# Patient Record
Sex: Female | Born: 1970 | Race: White | Hispanic: No | Smoking: Never smoker
Health system: Southern US, Community
[De-identification: ages and names within clinical notes are randomized; demographics above are authoritative.]

## PROBLEM LIST (undated history)

## (undated) DIAGNOSIS — K589 Irritable bowel syndrome without diarrhea: Secondary | ICD-10-CM

## (undated) DIAGNOSIS — Z91018 Allergy to other foods: Secondary | ICD-10-CM

## (undated) DIAGNOSIS — I639 Cerebral infarction, unspecified: Secondary | ICD-10-CM

## (undated) DIAGNOSIS — G629 Polyneuropathy, unspecified: Secondary | ICD-10-CM

## (undated) DIAGNOSIS — K219 Gastro-esophageal reflux disease without esophagitis: Secondary | ICD-10-CM

## (undated) DIAGNOSIS — I10 Essential (primary) hypertension: Secondary | ICD-10-CM

## (undated) HISTORY — PX: ENDOMETRIAL ABLATION: SHX621

---

## 1998-01-03 ENCOUNTER — Other Ambulatory Visit: Admission: RE | Admit: 1998-01-03 | Discharge: 1998-01-03 | Payer: Self-pay | Admitting: Obstetrics and Gynecology

## 1999-02-12 ENCOUNTER — Other Ambulatory Visit: Admission: RE | Admit: 1999-02-12 | Discharge: 1999-02-12 | Payer: Self-pay | Admitting: Obstetrics and Gynecology

## 1999-03-11 ENCOUNTER — Ambulatory Visit (HOSPITAL_COMMUNITY): Admission: RE | Admit: 1999-03-11 | Discharge: 1999-03-11 | Payer: Self-pay | Admitting: Obstetrics and Gynecology

## 1999-03-11 ENCOUNTER — Encounter: Payer: Self-pay | Admitting: Obstetrics and Gynecology

## 2000-03-14 ENCOUNTER — Other Ambulatory Visit: Admission: RE | Admit: 2000-03-14 | Discharge: 2000-03-14 | Payer: Self-pay | Admitting: Obstetrics and Gynecology

## 2001-03-27 ENCOUNTER — Other Ambulatory Visit: Admission: RE | Admit: 2001-03-27 | Discharge: 2001-03-27 | Payer: Self-pay | Admitting: Obstetrics and Gynecology

## 2002-02-03 ENCOUNTER — Emergency Department (HOSPITAL_COMMUNITY): Admission: EM | Admit: 2002-02-03 | Discharge: 2002-02-03 | Payer: Self-pay | Admitting: *Deleted

## 2002-05-02 ENCOUNTER — Other Ambulatory Visit: Admission: RE | Admit: 2002-05-02 | Discharge: 2002-05-02 | Payer: Self-pay | Admitting: Obstetrics and Gynecology

## 2002-11-20 ENCOUNTER — Other Ambulatory Visit: Admission: RE | Admit: 2002-11-20 | Discharge: 2002-11-20 | Payer: Self-pay | Admitting: Obstetrics and Gynecology

## 2003-05-29 ENCOUNTER — Other Ambulatory Visit: Admission: RE | Admit: 2003-05-29 | Discharge: 2003-05-29 | Payer: Self-pay | Admitting: Obstetrics and Gynecology

## 2004-07-08 ENCOUNTER — Other Ambulatory Visit: Admission: RE | Admit: 2004-07-08 | Discharge: 2004-07-08 | Payer: Self-pay | Admitting: Obstetrics and Gynecology

## 2004-10-28 ENCOUNTER — Inpatient Hospital Stay (HOSPITAL_COMMUNITY): Admission: AD | Admit: 2004-10-28 | Discharge: 2004-10-28 | Payer: Self-pay | Admitting: Obstetrics and Gynecology

## 2005-01-20 ENCOUNTER — Encounter (INDEPENDENT_AMBULATORY_CARE_PROVIDER_SITE_OTHER): Payer: Self-pay | Admitting: *Deleted

## 2005-01-20 ENCOUNTER — Inpatient Hospital Stay (HOSPITAL_COMMUNITY): Admission: AD | Admit: 2005-01-20 | Discharge: 2005-01-22 | Payer: Self-pay | Admitting: Obstetrics and Gynecology

## 2005-01-23 ENCOUNTER — Encounter: Admission: RE | Admit: 2005-01-23 | Discharge: 2005-02-18 | Payer: Self-pay | Admitting: Obstetrics and Gynecology

## 2005-07-15 ENCOUNTER — Other Ambulatory Visit: Admission: RE | Admit: 2005-07-15 | Discharge: 2005-07-15 | Payer: Self-pay | Admitting: Obstetrics and Gynecology

## 2006-12-29 ENCOUNTER — Encounter: Admission: RE | Admit: 2006-12-29 | Discharge: 2006-12-29 | Payer: Self-pay | Admitting: Neurosurgery

## 2007-11-16 ENCOUNTER — Ambulatory Visit (HOSPITAL_COMMUNITY): Admission: RE | Admit: 2007-11-16 | Discharge: 2007-11-16 | Payer: Self-pay | Admitting: Obstetrics and Gynecology

## 2007-11-16 ENCOUNTER — Encounter (INDEPENDENT_AMBULATORY_CARE_PROVIDER_SITE_OTHER): Payer: Self-pay | Admitting: Obstetrics and Gynecology

## 2010-10-18 ENCOUNTER — Encounter: Payer: Self-pay | Admitting: Obstetrics and Gynecology

## 2011-02-09 NOTE — Op Note (Signed)
NAMEVESNA, Annette Lucas               ACCOUNT NO.:  [id number]   MEDICAL RECORD NO.:  [id number]          PATIENT TYPE:  AMB   LOCATION:  SDC                           FACILITY:  [facility]   PHYSICIAN:  Zenaida Niece, M.D.DATE OF BIRTH:  November 09, 1970   DATE OF PROCEDURE:  11/16/2007  DATE OF DISCHARGE:                               OPERATIVE REPORT   PREOPERATIVE DIAGNOSES:  1. Menorrhagia.  2. Dysmenorrhea.  3. Pelvic pain.   POSTOPERATIVE DIAGNOSES:  1. Menorrhagia.  2. Dysmenorrhea.  3. Pelvic pain.  4. Endometriosis.   PROCEDURE:  Laparoscopy with fulguration of endometriosis, hysteroscopy  with dilation and curettage, and NovaSure endometrial ablation.   SURGEON:  Zenaida Niece, M.D.   ANESTHESIA:  General endotracheal tube and paracervical block.   FINDINGS:  She had a normal pelvis except for implants of endometriosis  in both ovarian fossa, normal uterine cavity.  The NovaSure device used  a depth of 6 cm, width of 3.8 cm, and used 125 watts for 65 seconds.   SPECIMENS:  Endometrial curettings sent to pathology.   ESTIMATED BLOOD LOSS:  Minimal.   COMPLICATIONS:  None.   PROCEDURE IN DETAIL:  The patient was taken to the operating room and  placed in the dorsosupine position.  General anesthesia was induced, and  she was placed in mobile stirrups.  The abdomen, perineum, and vagina  were then prepped and draped in the usual sterile fashion, bladder  drained with a latex-free catheter, Hulka tenaculum applied to the  cervix for uterine manipulation.   Infraumbilical skin was then infiltrated with 0.25% Marcaine and 0.75-cm  vertical incision was made.  The Veress needle was inserted into the  peritoneal cavity and placement confirmed by the water drop test and an  opening pressure of 2 mmHg.  CO2 gas was insufflated to a pressure of 14  mmHg, and the Veress needle was removed.  A 5-mm Optiview trocar was  then introduced with direct visualization with the  laparoscope.  A 5-mm  port was then also placed on the left side also under direct  visualization.  Inspection revealed the above-mentioned findings with a  normal appendix and upper abdomen.  The uterus, tubes, and ovaries  appeared normal.  She had implants consistent with endometriosis in both  ovarian fossa.  Implants on the right side were fulgurated with bipolar  cautery, as they were all superior to the ureter.  Some of the implants  on the left side were fulgurated with bipolar cautery, but a significant  amount were right over the ureter and were left intact.  No further  source for her pain was noted.  The 5-mm port on the left side was  removed under direct visualization.  All gas was allowed to deflate from  the abdomen, and the umbilical trocar was removed.  The skin incisions  were closed with interrupted subcuticular sutures of 4-0 Vicryl followed  by Dermabond.   Attention was turned vaginally.  The legs were elevated in stirrups.  The Hulka tenaculum was removed from the cervix.  A Graves speculum was  inserted  into the vagina and the anterior lip of the cervix grasped with  a single-tooth tenaculum.  The patient was given IV Toradol.  A deep  paracervical block was performed with a total of 16 mL of 2% plain  lidocaine.  The uterus sounded to approximately 9 cm.  The cervix  gradually easily dilated to a size 21 dilator and the observer  hysteroscope was easily inserted.  Good visualization was achieved and  revealed some polypoid endometrial tissue, probably more just normal  endometrial tissue instead of a polyp.  The hysteroscope was removed,  and endometrial curettage was performed with return of a small amount of  tissue.  Hysteroscopy was then repeated, and minimal tissue was left in  the uterus from which again had a normal cavity.  The hysteroscope was  removed after all fluid was removed.  Using the sure sound, the uterine  depth was 6 cm.  The cervix was  gradually dilated to a size 7 and then a  size 8 Hegar dilator.  The NovaSure device was easily then inserted and  deployed.  CO2 test was passed.  Endometrial ablation was then performed  with the above-mentioned settings without complications.  The device was  then removed intact.  The single-tooth tenaculum was removed, and  bleeding was controlled with pressure.  All instruments were then  removed from the vagina.   The patient tolerated the procedure well and was taken to the recovery  room in stable condition.  Counts were correct.  She had PAS hose on  throughout the procedure.      Zenaida Niece, M.D.  Electronically Signed     TDM/MEDQ  D:  11/16/2007  T:  11/16/2007  Job:  [id number]

## 2011-02-12 NOTE — Discharge Summary (Signed)
NAMEJOHNATHAN, TORTORELLI               ACCOUNT NO.:  [id number]   MEDICAL RECORD NO.:  [id number]          PATIENT TYPE:  INP   LOCATION:  9123                          FACILITY:  [facility]   PHYSICIAN:  Malachi Pro. Ambrose Mantle, M.D. DATE OF BIRTH:  October 10, 1970   DATE OF ADMISSION:  01/19/2005  DATE OF DISCHARGE:                                 DISCHARGE SUMMARY   HOSPITAL COURSE:  This is a 40 year old white female para 0-0-1-0 gravida 2;  estimated gestational age [redacted] weeks by 9-week ultrasound with [facility] January 20, 2005; presented with regular contractions. She was evaluated in maternity  admission with a cervix 2+ cm and regular contractions. Her blood group and  type was O negative with a negative antibody, RPR nonreactive, rubella  equivocal, hepatitis B surface antigen negative, HIV declined, GC and  chlamydia negative, triple screen normal, 1-hour Glucola 80, group B strep  negative. Prenatal care:  The patient had a placenta previa on ultrasound  that resolved. Obstetric history:  She had had a spontaneous abortion. Past  medical history:  Irritable bowel syndrome, urinary tract infection, and  fracture of arm and clavicle. Surgical history:  Cone biopsy. Allergies:  PENICILLIN and LEVAQUIN. Medications:  Zantac. Social history:  Married,  without tobacco. Family history negative. On admission, the patient was  afebrile with normal vital signs, heart was normal, lungs clear. Abdomen was  soft, estimated fetal weight 7-and-a-half pounds. Cervix was 4 cm, 90%. Dr.  Jackelyn Knife ruptured the patient's membranes, fluid was clear. By 9 a.m. the  contractions were every 2 to 3-and-a-half minutes, cervix 3-4 cm, 90%,  vertex at a -2, and her temperature was 100.0 degrees. By 12:55 p.m. the  contractions were every 2-3 minutes. Cervix had progressed to an anterior  lip. Vertex was at a 0 to +1 station. By 4:40 p.m. the patient had become  fully dilated at 3:15 p.m. and her temperature was 101+. The vertex  was ROP.  I chose not to use antibiotics since delivery was near. The patient's vertex  remained ROP until it rotated shortly before delivery. She then delivered  spontaneously ROA over a first degree vaginal and bilateral labial  lacerations, a living female infant 7 pounds 9 ounces, with Apgars of 7 at  one and 9 at five minutes. Delivery occurred at 5:42 p.m. Placenta was  intact with a succenturiate lobe. The uterus was normal, rectal was  negative. Lacerations were repaired with 3-0 Vicryl, blood loss about 500  mL.  Postpartum, the patient's temperature did not rise any. She remained  afebrile. She felt good except for her left arm continued to cause  significant problems. She had been evaluated by this by a Dr. Prince Rome in her  orthopedic office prior to delivery and now she will seek consultation with  him again within the next few days.   LABORATORY DATA:  The patient needs RhoGAM. Her hemoglobin was 12.2;  hematocrit 35.2; white count 19,000; platelet count 302,000. Follow-up  hemoglobin 9.6. RPR nonreactive. The patient is equivocal on rubella and she  should receive the rubella vaccine now or in  the future.   FINAL DIAGNOSES:  1.  Intrauterine pregnancy at 40 weeks delivered right occiput anterior.  2.  Prolonged second stage of labor.  3.  Possible chorioamnionitis.   OPERATION:  Spontaneous delivery ROA, repair of vaginal and labial  lacerations.   FINAL CONDITION:  Improved.   INSTRUCTIONS:  Include our regular discharge instruction booklet. The  patient is to receive RhoGAM, and if RhoGAM and rubella should not be given  at the same time then she should receive the rubella vaccine at a later  date. The patient is to return to the office in 6 weeks. Percocet 5/325 #24  tablets one q.4-6h. as needed for pain is given at discharge.      TFH/MEDQ  D:  01/22/2005  T:  01/22/2005  Job:  [id number]

## 2011-06-18 LAB — CBC
HCT: 40.6
Hemoglobin: 14.3
MCHC: 35.2
MCV: 87.7
Platelets: 317
RBC: 4.63
RDW: 12.8
WBC: 8.7

## 2011-06-18 LAB — PREGNANCY, URINE: Preg Test, Ur: NEGATIVE

## 2011-06-29 ENCOUNTER — Inpatient Hospital Stay (HOSPITAL_COMMUNITY): Admission: RE | Admit: 2011-06-29 | Payer: No Typology Code available for payment source | Source: Ambulatory Visit

## 2011-06-30 ENCOUNTER — Encounter (HOSPITAL_COMMUNITY): Payer: Self-pay

## 2011-06-30 ENCOUNTER — Encounter (HOSPITAL_COMMUNITY)
Admission: RE | Admit: 2011-06-30 | Discharge: 2011-06-30 | Disposition: A | Discharge status: Home / Self Care | Payer: PRIVATE HEALTH INSURANCE | Source: Ambulatory Visit | Attending: Obstetrics and Gynecology | Admitting: Obstetrics and Gynecology

## 2011-06-30 HISTORY — DX: Irritable bowel syndrome, unspecified: K58.9

## 2011-06-30 LAB — SURGICAL PCR SCREEN
MRSA, PCR: NEGATIVE
Staphylococcus aureus: NEGATIVE

## 2011-06-30 LAB — CBC
HCT: 41.5 % (ref 36.0–46.0)
Hemoglobin: 14.3 g/dL (ref 12.0–15.0)
MCH: 31.2 pg (ref 26.0–34.0)
MCHC: 34.5 g/dL (ref 30.0–36.0)
MCV: 90.6 fL (ref 78.0–100.0)
Platelets: 315 10*3/uL (ref 150–400)
RBC: 4.58 MIL/uL (ref 3.87–5.11)
RDW: 12.8 % (ref 11.5–15.5)
WBC: 10.3 10*3/uL (ref 4.0–10.5)

## 2011-06-30 NOTE — Patient Instructions (Signed)
   Your procedure is scheduled on:10/9 Enter through the Main Entrance of [facility] at:0600 Pick up the phone at the desk and dial [phone] and inform us of your arrival  Please call this number if you have any problems the morning of surgery: [phone]  Remember: Do not eat food after midnight  Do not drink clear liquids after:midnight Take these medicines the morning of surgery with a SIP OF WATER:  Do not wear jewelry, make-up, or FINGER nail polish Do not wear lotions, powders, or perfumes.  You may wear deodorant. Do not shave 48 hours prior to surgery. Do not bring valuables to the hospital. Leave suitcase in the car. After Surgery it may be brought to your room. For patients being admitted to the hospital, checkout time is 11:00am the day of discharge.  Patients discharged on the day of surgery will not be allowed to drive home.   Name and phone number of your driver:Chris- [phone]   Remember to use your hibiclens as instructed.Please shower with 1/2 bottle the evening before your surgery and the other 1/2 bottle the morning of surgery.

## 2011-07-05 MED ORDER — GENTAMICIN SULFATE 40 MG/ML IJ SOLN
INTRAVENOUS | Status: AC
  Administered 2011-07-06: 100 mL via INTRAVENOUS
  Filled 2011-07-05: qty 2.5

## 2011-07-06 ENCOUNTER — Inpatient Hospital Stay (HOSPITAL_COMMUNITY): Payer: PRIVATE HEALTH INSURANCE | Admitting: Anesthesiology

## 2011-07-06 ENCOUNTER — Encounter (HOSPITAL_COMMUNITY): Payer: Self-pay | Admitting: *Deleted

## 2011-07-06 ENCOUNTER — Other Ambulatory Visit: Payer: Self-pay | Admitting: Obstetrics and Gynecology

## 2011-07-06 ENCOUNTER — Inpatient Hospital Stay (HOSPITAL_COMMUNITY)
Admission: RE | Admit: 2011-07-06 | Discharge: 2011-07-07 | DRG: 743 | Disposition: A | Discharge status: Home / Self Care | Payer: PRIVATE HEALTH INSURANCE | Source: Ambulatory Visit | Attending: Obstetrics and Gynecology | Admitting: Obstetrics and Gynecology

## 2011-07-06 ENCOUNTER — Encounter (HOSPITAL_COMMUNITY): Payer: Self-pay | Admitting: Anesthesiology

## 2011-07-06 ENCOUNTER — Encounter (HOSPITAL_COMMUNITY)
Admission: RE | Disposition: A | Discharge status: Home / Self Care | Payer: Self-pay | Source: Ambulatory Visit | Attending: Obstetrics and Gynecology

## 2011-07-06 DIAGNOSIS — N925 Other specified irregular menstruation: Principal | ICD-10-CM | POA: Diagnosis present

## 2011-07-06 DIAGNOSIS — N938 Other specified abnormal uterine and vaginal bleeding: Principal | ICD-10-CM | POA: Diagnosis present

## 2011-07-06 DIAGNOSIS — N920 Excessive and frequent menstruation with regular cycle: Secondary | ICD-10-CM

## 2011-07-06 DIAGNOSIS — N949 Unspecified condition associated with female genital organs and menstrual cycle: Principal | ICD-10-CM | POA: Diagnosis present

## 2011-07-06 DIAGNOSIS — Z01818 Encounter for other preprocedural examination: Secondary | ICD-10-CM

## 2011-07-06 DIAGNOSIS — Z01812 Encounter for preprocedural laboratory examination: Secondary | ICD-10-CM

## 2011-07-06 DIAGNOSIS — K589 Irritable bowel syndrome without diarrhea: Secondary | ICD-10-CM | POA: Diagnosis present

## 2011-07-06 HISTORY — PX: VAGINAL HYSTERECTOMY: SHX2639

## 2011-07-06 HISTORY — PX: CYSTOSCOPY: SHX5120

## 2011-07-06 LAB — PREGNANCY, URINE: Preg Test, Ur: NEGATIVE

## 2011-07-06 SURGERY — HYSTERECTOMY, VAGINAL
Anesthesia: General | Site: Uterus | Wound class: Clean Contaminated

## 2011-07-06 MED ORDER — LIDOCAINE HCL (CARDIAC) 20 MG/ML IV SOLN
INTRAVENOUS | Status: DC | PRN
  Administered 2011-07-06: 50 mg via INTRAVENOUS

## 2011-07-06 MED ORDER — KETOROLAC TROMETHAMINE 30 MG/ML IJ SOLN
INTRAMUSCULAR | Status: AC
  Filled 2011-07-06: qty 1

## 2011-07-06 MED ORDER — ONDANSETRON HCL 4 MG/2ML IJ SOLN
INTRAMUSCULAR | Status: DC | PRN
  Administered 2011-07-06: 4 mg via INTRAVENOUS

## 2011-07-06 MED ORDER — HYOSCYAMINE SULFATE 0.125 MG SL SUBL
0.1250 mg | SUBLINGUAL_TABLET | SUBLINGUAL | Status: DC | PRN
  Filled 2011-07-06: qty 1

## 2011-07-06 MED ORDER — LACTATED RINGERS IV SOLN
INTRAVENOUS | Status: DC
  Administered 2011-07-06: 1000 mL via INTRAVENOUS

## 2011-07-06 MED ORDER — ONDANSETRON HCL 4 MG/2ML IJ SOLN
INTRAMUSCULAR | Status: AC
  Filled 2011-07-06: qty 2

## 2011-07-06 MED ORDER — MIDAZOLAM HCL 2 MG/2ML IJ SOLN
0.5000 mg | INTRAMUSCULAR | Status: DC | PRN
  Administered 2011-07-06: 2 mg via INTRAVENOUS

## 2011-07-06 MED ORDER — PROPOFOL 10 MG/ML IV EMUL
INTRAVENOUS | Status: DC | PRN
  Administered 2011-07-06: 150 mg via INTRAVENOUS

## 2011-07-06 MED ORDER — DIPHENHYDRAMINE HCL 12.5 MG/5ML PO ELIX
12.5000 mg | ORAL_SOLUTION | Freq: Four times a day (QID) | ORAL | Status: DC | PRN
  Administered 2011-07-06 (×2): 12.5 mg via ORAL
  Filled 2011-07-06 (×2): qty 5

## 2011-07-06 MED ORDER — FENTANYL CITRATE 0.05 MG/ML IJ SOLN
INTRAMUSCULAR | Status: DC | PRN
  Administered 2011-07-06: 100 ug via INTRAVENOUS
  Administered 2011-07-06: 150 ug via INTRAVENOUS
  Administered 2011-07-06: 100 ug via INTRAVENOUS

## 2011-07-06 MED ORDER — STERILE WATER FOR IRRIGATION IR SOLN
Status: DC | PRN
  Administered 2011-07-06: 1000 mL

## 2011-07-06 MED ORDER — INDIGOTINDISULFONATE SODIUM 8 MG/ML IJ SOLN
INTRAMUSCULAR | Status: DC | PRN
  Administered 2011-07-06: 5 mL via INTRAVENOUS

## 2011-07-06 MED ORDER — PROPOFOL 10 MG/ML IV EMUL
INTRAVENOUS | Status: AC
  Filled 2011-07-06: qty 20

## 2011-07-06 MED ORDER — LACTATED RINGERS IV SOLN
INTRAVENOUS | Status: DC
  Administered 2011-07-06 (×4): via INTRAVENOUS

## 2011-07-06 MED ORDER — KETOROLAC TROMETHAMINE 30 MG/ML IJ SOLN
30.0000 mg | Freq: Four times a day (QID) | INTRAMUSCULAR | Status: DC
  Administered 2011-07-06 – 2011-07-07 (×3): 30 mg via INTRAVENOUS
  Filled 2011-07-06 (×4): qty 1

## 2011-07-06 MED ORDER — IBUPROFEN 600 MG PO TABS: 600.0000 mg | ORAL_TABLET | Freq: Four times a day (QID) | ORAL | Status: DC | PRN

## 2011-07-06 MED ORDER — DIPHENHYDRAMINE HCL 50 MG/ML IJ SOLN: 12.5000 mg | Freq: Four times a day (QID) | INTRAMUSCULAR | Status: DC | PRN

## 2011-07-06 MED ORDER — LORATADINE 10 MG PO TABS
10.0000 mg | ORAL_TABLET | Freq: Every day | ORAL | Status: DC
  Administered 2011-07-07: 10 mg via ORAL
  Filled 2011-07-06 (×2): qty 1

## 2011-07-06 MED ORDER — HYDROMORPHONE 0.3 MG/ML IV SOLN
INTRAVENOUS | Status: DC
  Administered 2011-07-06: 0.7 mg via INTRAVENOUS
  Administered 2011-07-06: 0.799 mg via INTRAVENOUS
  Administered 2011-07-06: 10:00:00 via INTRAVENOUS
  Administered 2011-07-06: 1.79 mg via INTRAVENOUS
  Administered 2011-07-07: 0.399 mg via INTRAVENOUS
  Administered 2011-07-07: 0.4 mg via INTRAVENOUS

## 2011-07-06 MED ORDER — MIDAZOLAM HCL 2 MG/2ML IJ SOLN
INTRAMUSCULAR | Status: AC
  Administered 2011-07-06: 2 mg via INTRAVENOUS
  Filled 2011-07-06: qty 2

## 2011-07-06 MED ORDER — HYDROMORPHONE 0.3 MG/ML IV SOLN
INTRAVENOUS | Status: AC
  Filled 2011-07-06: qty 25

## 2011-07-06 MED ORDER — ONDANSETRON HCL 4 MG PO TABS: 4.0000 mg | ORAL_TABLET | Freq: Four times a day (QID) | ORAL | Status: DC | PRN

## 2011-07-06 MED ORDER — KETOROLAC TROMETHAMINE 30 MG/ML IJ SOLN: 30.0000 mg | Freq: Four times a day (QID) | INTRAMUSCULAR | Status: DC

## 2011-07-06 MED ORDER — FAMOTIDINE 20 MG PO TABS
ORAL_TABLET | ORAL | Status: AC
  Administered 2011-07-06: 20 mg via ORAL
  Filled 2011-07-06: qty 1

## 2011-07-06 MED ORDER — VASOPRESSIN 20 UNIT/ML IJ SOLN
INTRAVENOUS | Status: DC | PRN
  Administered 2011-07-06: 08:00:00 via INTRAMUSCULAR

## 2011-07-06 MED ORDER — ZOLPIDEM TARTRATE 5 MG PO TABS: 5.0000 mg | ORAL_TABLET | Freq: Every evening | ORAL | Status: DC | PRN

## 2011-07-06 MED ORDER — HYDROCODONE-ACETAMINOPHEN 5-325 MG PO TABS: 1.0000 | ORAL_TABLET | ORAL | Status: DC | PRN

## 2011-07-06 MED ORDER — LIDOCAINE HCL (CARDIAC) 20 MG/ML IV SOLN
INTRAVENOUS | Status: AC
  Filled 2011-07-06: qty 5

## 2011-07-06 MED ORDER — ONDANSETRON HCL 4 MG/2ML IJ SOLN: 4.0000 mg | Freq: Four times a day (QID) | INTRAMUSCULAR | Status: DC | PRN

## 2011-07-06 MED ORDER — FAMOTIDINE 20 MG PO TABS
20.0000 mg | ORAL_TABLET | Freq: Once | ORAL | Status: AC
  Administered 2011-07-06 (×2): 20 mg via ORAL

## 2011-07-06 MED ORDER — FENTANYL CITRATE 0.05 MG/ML IJ SOLN
INTRAMUSCULAR | Status: AC
  Filled 2011-07-06: qty 2

## 2011-07-06 MED ORDER — SODIUM CHLORIDE 0.9 % IJ SOLN: 9.0000 mL | INTRAMUSCULAR | Status: DC | PRN

## 2011-07-06 MED ORDER — MIDAZOLAM HCL 2 MG/2ML IJ SOLN
INTRAMUSCULAR | Status: AC
  Filled 2011-07-06: qty 2

## 2011-07-06 MED ORDER — HYOSCYAMINE SULFATE 0.125 MG PO TABS
0.1250 mg | ORAL_TABLET | ORAL | Status: DC | PRN
  Filled 2011-07-06: qty 1

## 2011-07-06 MED ORDER — NALOXONE HCL 0.4 MG/ML IJ SOLN: 0.4000 mg | INTRAMUSCULAR | Status: DC | PRN

## 2011-07-06 MED ORDER — DEXAMETHASONE SODIUM PHOSPHATE 4 MG/ML IJ SOLN
INTRAMUSCULAR | Status: DC | PRN
  Administered 2011-07-06: 5 mg via INTRAVENOUS

## 2011-07-06 MED ORDER — LACTATED RINGERS IV SOLN
INTRAVENOUS | Status: DC
  Administered 2011-07-06 – 2011-07-07 (×3): via INTRAVENOUS

## 2011-07-06 MED ORDER — KETOROLAC TROMETHAMINE 30 MG/ML IJ SOLN
INTRAMUSCULAR | Status: DC | PRN
  Administered 2011-07-06: 30 mg via INTRAVENOUS

## 2011-07-06 MED ORDER — MIDAZOLAM HCL 5 MG/5ML IJ SOLN
INTRAMUSCULAR | Status: DC | PRN
  Administered 2011-07-06: 2 mg via INTRAVENOUS

## 2011-07-06 MED ORDER — FENTANYL CITRATE 0.05 MG/ML IJ SOLN
INTRAMUSCULAR | Status: AC
  Filled 2011-07-06: qty 5

## 2011-07-06 SURGICAL SUPPLY — 26 items
CANISTER SUCTION 2500CC (MISCELLANEOUS) ×3 IMPLANT
CLOTH BEACON ORANGE TIMEOUT ST (SAFETY) ×3 IMPLANT
CONT PATH 16OZ SNAP LID 3702 (MISCELLANEOUS) ×3 IMPLANT
DECANTER SPIKE VIAL GLASS SM (MISCELLANEOUS) ×3 IMPLANT
DRAPE HYSTEROSCOPY (DRAPE) ×3 IMPLANT
DRAPE UTILITY XL STRL (DRAPES) ×3 IMPLANT
GLOVE BIO SURGEON STRL SZ8 (GLOVE) ×3 IMPLANT
GLOVE BIOGEL PI IND STRL 6.5 (GLOVE) ×2 IMPLANT
GLOVE BIOGEL PI INDICATOR 6.5 (GLOVE) ×1
GLOVE ORTHO TXT STRL SZ7.5 (GLOVE) ×3 IMPLANT
GOWN BRE IMP SLV AUR LG STRL (GOWN DISPOSABLE) ×6 IMPLANT
GOWN BRE IMP SLV AUR XL STRL (GOWN DISPOSABLE) ×3 IMPLANT
GOWN STRL REIN XL XLG (GOWN DISPOSABLE) ×3 IMPLANT
NS IRRIG 1000ML POUR BTL (IV SOLUTION) ×3 IMPLANT
PACK VAGINAL WOMENS (CUSTOM PROCEDURE TRAY) ×3 IMPLANT
SET CYSTO W/LG BORE CLAMP LF (SET/KITS/TRAYS/PACK) ×3 IMPLANT
SUT CHROMIC 1 TIES 18 (SUTURE) ×3 IMPLANT
SUT CHROMIC 1MO 4 18 CR8 (SUTURE) ×6 IMPLANT
SUT SILK 2 0 SH (SUTURE) ×3 IMPLANT
SUT VIC AB 2-0 CT1 27 (SUTURE) ×1
SUT VIC AB 2-0 CT1 TAPERPNT 27 (SUTURE) ×2 IMPLANT
SUT VIC AB 3-0 SH 27 (SUTURE)
SUT VIC AB 3-0 SH 27X BRD (SUTURE) IMPLANT
TOWEL OR 17X24 6PK STRL BLUE (TOWEL DISPOSABLE) ×6 IMPLANT
TRAY FOLEY CATH 14FR (SET/KITS/TRAYS/PACK) ×3 IMPLANT
WATER STERILE IRR 1000ML POUR (IV SOLUTION) ×3 IMPLANT

## 2011-07-06 NOTE — Anesthesia Preprocedure Evaluation (Addendum)
Anesthesia Evaluation  Name, MR# and DOB Patient awake  General Assessment Comment  Reviewed: Allergy & Precautions, H&P , NPO status , Patient's Chart, lab work & pertinent test results  Airway Mallampati: I TM Distance: >3 FB Neck ROM: full    Dental No notable dental hx. (+) Teeth Intact   Pulmonary    Pulmonary exam normal       Cardiovascular     Neuro/Psych Negative Neurological ROS  Negative Psych ROS   GI/Hepatic negative GI ROS Neg liver ROS    Endo/Other  Negative Endocrine ROS  Renal/GU negative Renal ROS     Musculoskeletal negative musculoskeletal ROS (+)   Abdominal Normal abdominal exam  (+)   Peds  Hematology negative hematology ROS (+)   Anesthesia Other Findings   Reproductive/Obstetrics negative OB ROS                           Anesthesia Physical Anesthesia Plan  ASA: II  Anesthesia Plan: General   Post-op Pain Management:    Induction: Intravenous  Airway Management Planned: LMA  Additional Equipment:   Intra-op Plan:   Post-operative Plan:   Informed Consent: I have reviewed the patients History and Physical, chart, labs and discussed the procedure including the risks, benefits and alternatives for the proposed anesthesia with the patient or authorized representative who has indicated his/her understanding and acceptance.     Plan Discussed with: CRNA  Anesthesia Plan Comments:        Anesthesia Quick Evaluation

## 2011-07-06 NOTE — H&P (Signed)
Annette Lucas is an 40 y.o. female. P1011, here for definitive treatment of abnormal bleeding.  She had Novasure in 10-2007 and initially did well.  However, she has recently had frequent irregular bleeding episodes with normal exam.  All options have been discussed and she wants definitive therapy.  Pertinent Gynecological History: Menses: irregular Previous GYN Procedures: cone biopsy, D&C  Last pap: normal  OB History: G2, P1011   Menstrual History: No LMP recorded.    Past Medical History  Diagnosis Date  . IBS (irritable bowel syndrome)   Cervical Dysplasia  Past Surgical History  Procedure Date  . Endometrial ablation   Cone biopsy D&C Laparoscopy  History reviewed. No pertinent family history.  Social History:  reports that she has never smoked. She does not have any smokeless tobacco history on file. She reports that she does not drink alcohol or use illicit drugs.  Allergies:  Allergies  Allergen Reactions  . Levaquin Anaphylaxis  . Penicillins Anaphylaxis    Prescriptions prior to admission  Medication Sig Dispense Refill  . Cetirizine HCl (ZYRTEC PO) Take 1 tablet by mouth daily as needed. For allergy.       . hyoscyamine (LEVSIN) 0.125 MG/5ML ELIX Take 0.125 mg by mouth every 4 (four) hours as needed. For IBS.       Marland Kitchen ibuprofen (ADVIL,MOTRIN) 200 MG tablet Take 200 mg by mouth every 6 (six) hours as needed. For pain.         Review of Systems  Respiratory: Negative.   Cardiovascular: Negative.   Gastrointestinal: Negative.   Genitourinary: Negative.     Blood pressure 143/81, pulse 78, temperature 98.1 F (36.7 C), temperature source Oral, resp. rate 18, SpO2 100.00%. Physical Exam  Constitutional: She appears well-developed and well-nourished.  Neck: Neck supple. No thyromegaly present.  Cardiovascular: Normal rate, regular rhythm and normal heart sounds.   No murmur heard. Respiratory: Breath sounds normal. No respiratory distress. She has no  wheezes.  GI: Soft. She exhibits no distension and no mass. There is no tenderness.  Genitourinary: Vagina normal and uterus normal.    Results for orders placed during the hospital encounter of 07/06/11 (from the past 24 hour(s))  PREGNANCY, URINE     Status: Normal   Collection Time   07/06/11  6:20 AM      Component Value Range   Preg Test, Ur NEGATIVE      No results found.  Assessment/Plan: Recurrent abnormal bleeding, prior Novasure.  I have discussed all medical and surgical options.  She wants definitive therapy with hysterectomy.  Discussed TVH procedure, risks, and that it is almost certain this will fix her bleeding.  Will proceed with TVH.  Jerid Catherman D 07/06/2011, 7:03 AM

## 2011-07-06 NOTE — Progress Notes (Signed)
UR Chart review completed.  

## 2011-07-06 NOTE — Op Note (Signed)
Preoperative diagnosis: Abnormal uterine bleeding Postop diagnosis: Abnormal uterine bleeding Procedure: Total vaginal hysterectomy and cystoscopy Surgeon: Lavina Hamman M.D. Assistant: Huel Cote M.D. Anesthesia: Gen. With an LMA Findings: Patient had a small uterus with normal tubes and ovaries. Via cystoscopy bladder was normal and both ureters were patent Estimated blood loss: 100 cc Specimens: Uterus into routine pathology Complications: None  Procedure in detail: The patient was taken to the operating room and placed in the dorsosupine position. General anesthesia was induced and she was placed in mobile stirrups. Legs were elevated in the stirrups. Perineum and vagina were then prepped and draped in the usual sterile fashion, bladder drained with red Robinson catheter. A Graves speculum was inserted in the vagina and the cervix was grasped with Christella Hartigan tenaculums. Dilute Pitressin was then instilled at the cervicovaginal junction which was then incised circumferentially with electrocautery. Sharp dissection was then used to further free the vagina from the cervix. Anterior peritoneum was identified and entered sharply. A Deaver retractor was used to retract the bladder anteriorly. Posterior cul-de-sac was identified and entered sharply. A Bonnano speculum was placed into the posterior cul-de-sac. Uterosacral ligaments were clamped transected and ligated with #1 chromic and tagged for later use. Uterine arteries cardinal ligaments and broad ligaments were also clamped transected and ligated with #1 chromic. Uterine pedicles were then clamped transected and doubly ligated with #1 chromic and tagged for inspection. Bleeding from each side was controlled with a figure-of-eight suture of #1 chromic. Tubes and ovaries were inspected and found to be normal. The uterosacral ligaments were then plicated in the midline with 2-0 silk. The Bonnano speculum was removed and a shorter vaginal speculum was  placed. The previously tagged uterosacral pedicles were also tied in the midline. No significant bleeding was identified. The vagina was then closed in a vertical fashion with running locking 2-0 Vicryl with adequate closure and adequate hemostasis.  Attention was turned to cystoscopy. The patient had been given indigo carmine IV. A 70 cystoscope was inserted and 100 cc of fluid was instilled. The bladder appeared normal. Both ureteral orifices were easily identified and blue tinged urine was seen to flow freely from each orifice. The cystoscope was removed and a Foley catheter was placed. The patient was taken down from stirrups. She was awakened in the operating room and taken to the recovery room in stable condition after tolerating the procedure well. Counts were correct, she had PAS hose on throughout the procedure, she received gentamicin and clindamycin preop.

## 2011-07-06 NOTE — Anesthesia Postprocedure Evaluation (Signed)
Anesthesia Post Note  Patient: Annette Lucas  Procedure(s) Performed:  HYSTERECTOMY VAGINAL; CYSTOSCOPY  Anesthesia type: General  Patient location: PACU  Post pain: Pain level controlled  Post assessment: Post-op Vital signs reviewed  Last Vitals:  Filed Vitals:   07/06/11 0900  BP: 123/76  Pulse: 68  Temp:   Resp: 12    Post vital signs: Reviewed  Level of consciousness: sedated  Complications: No apparent anesthesia complications

## 2011-07-06 NOTE — Transfer of Care (Signed)
Immediate Anesthesia Transfer of Care Note  Patient: Annette Lucas  Procedure(s) Performed:  HYSTERECTOMY VAGINAL; CYSTOSCOPY  Patient Location: PACU  Anesthesia Type: General  Level of Consciousness: awake, alert  and oriented  Airway & Oxygen Therapy: Patient Spontanous Breathing and Patient connected to nasal cannula oxygen  Post-op Assessment: Report given to PACU RN and Post -op Vital signs reviewed and stable  Post vital signs: Reviewed and stable  Complications: No apparent anesthesia complications

## 2011-07-06 NOTE — Progress Notes (Signed)
History reviewed, pt examined, no change in status.  Will proceed with TVH and cystoscopy.

## 2011-07-06 NOTE — Progress Notes (Signed)
Iv started by Janene Harvey, rn

## 2011-07-06 NOTE — Anesthesia Postprocedure Evaluation (Signed)
  Anesthesia Post-op Note  Patient: Annette Lucas  Procedure(s) Performed:  HYSTERECTOMY VAGINAL; CYSTOSCOPY  Patient Location: 311  Anesthesia Type: General  Level of Consciousness: awake, alert  and oriented  Airway and Oxygen Therapy: Patient Spontanous Breathing  Post-op Pain: mild  Post-op Assessment: Post-op Vital signs reviewed and Patient's Cardiovascular Status Stable  Post-op Vital Signs: Reviewed and stable  Complications: No apparent anesthesia complications

## 2011-07-06 NOTE — Progress Notes (Signed)
Encounter addended by: Karleen Dolphin on: 07/06/2011  5:40 PM<BR>     Documentation filed: Notes Section

## 2011-07-07 ENCOUNTER — Encounter (HOSPITAL_COMMUNITY): Payer: Self-pay | Admitting: Obstetrics and Gynecology

## 2011-07-07 LAB — CBC
HCT: 34.7 % — ABNORMAL LOW (ref 36.0–46.0)
Hemoglobin: 11.9 g/dL — ABNORMAL LOW (ref 12.0–15.0)
MCH: 31.1 pg (ref 26.0–34.0)
MCHC: 34.3 g/dL (ref 30.0–36.0)
MCV: 90.6 fL (ref 78.0–100.0)
Platelets: 234 10*3/uL (ref 150–400)
RBC: 3.83 MIL/uL — ABNORMAL LOW (ref 3.87–5.11)
RDW: 12.9 % (ref 11.5–15.5)
WBC: 11.1 10*3/uL — ABNORMAL HIGH (ref 4.0–10.5)

## 2011-07-07 MED ORDER — HYDROCODONE-ACETAMINOPHEN 5-325 MG PO TABS: 1.0000 | ORAL_TABLET | ORAL | Status: AC | PRN

## 2011-07-07 NOTE — Progress Notes (Signed)
POD#1 TVH Doing ok, no n/v Afeb, VSS Abd- soft Hgb 11.9 Will d/c foley and PCA, ambulate, discharge home this pm if able to ambulate, void and tolerate PO

## 2011-07-07 NOTE — Discharge Summary (Signed)
Physician Discharge Summary  Patient ID: Annette Lucas MRN: [id number] DOB/AGE: [birth year] 40 y.o.  Admit date: 07/06/2011 Discharge date: 07/07/2011  Admission Diagnoses:  AUB   Discharge Diagnoses:  AUB Active Problems:  * No active hospital problems. *    Discharged Condition: good  Hospital Course: Pt admitted for [facility], surgery completed without difficulty.  No post-op problems.   Treatments: surgery: TVH and cystoscopy  Discharge Exam: Blood pressure 131/76, pulse 75, temperature 98.2 F (36.8 C), temperature source Oral, resp. rate 18, height 5\' 4"  (1.626 m), weight 62.143 kg (137 lb), SpO2 98.00%. Abdomen soft, some spotting  Disposition: Home or Self Care  Discharge Orders    Future Orders Please Complete By Expires   Diet general      Increase activity slowly      May walk up steps      May shower / Bathe      Lifting restrictions      Comments:   Should not lift more than 10 lbs   Sexual Activity Restrictions      Comments:   Nothing in vagina for 6 weeks   Call MD for:  temperature >100.4      Call MD for:  persistant nausea and vomiting      Call MD for:  severe uncontrolled pain      Call MD for:      Comments:   Heavy vaginal bleeding     Discharge Medication List as of 07/07/2011 12:57 PM    START taking these medications   Details  HYDROcodone-acetaminophen (NORCO) 5-325 MG per tablet Take 1-2 tablets by mouth every 4 (four) hours as needed., Starting 07/07/2011, Until Sat 07/17/11, Print      CONTINUE these medications which have NOT CHANGED   Details  Cetirizine HCl (ZYRTEC PO) Take 1 tablet by mouth daily as needed. For allergy. , Until Discontinued, Historical Med    hyoscyamine (LEVSIN) 0.125 MG/5ML ELIX Take 0.125 mg by mouth every 4 (four) hours as needed. For IBS. , Until Discontinued, Historical Med    ibuprofen (ADVIL,MOTRIN) 200 MG tablet Take 200 mg by mouth every 6 (six) hours as needed. For pain. , Until Discontinued, Historical  Med       Follow-up Information    Follow up with TRUE Garciamartinez D, MD. Make an appointment in 6 weeks.   Contact information:   [address], [address] [place] [address] [phone]          Signed: Zenaida Niece 07/07/2011, 5:38 PM

## 2011-07-07 NOTE — Plan of Care (Signed)
Problem: Phase II Progression Outcomes Goal: Other Phase II Outcomes/Goals Outcome: Completed/Met Date Met:  07/07/11 Ambulating independently in hallway

## 2011-07-08 NOTE — Progress Notes (Signed)
UR Chart review completed.  

## 2015-10-15 ENCOUNTER — Encounter (INDEPENDENT_AMBULATORY_CARE_PROVIDER_SITE_OTHER): Payer: Self-pay | Admitting: *Deleted

## 2015-11-11 ENCOUNTER — Ambulatory Visit (INDEPENDENT_AMBULATORY_CARE_PROVIDER_SITE_OTHER): Payer: PRIVATE HEALTH INSURANCE | Admitting: Internal Medicine

## 2016-08-20 NOTE — Patient Instructions (Addendum)
Your procedure is scheduled on:  Wednesday, Dec. 6, 2017  Enter through the [employer] [facility] at:  8:30 AM  Pick up the phone at the desk and dial [phone].  Call this number if you have problems the morning of surgery: [phone].  Remember: Do NOT eat food or drink after:  Midnight Tuesday, Dec. 5, 2017  Take these medicines the morning of surgery with a SIP OF WATER:  Zantac  Stop ALL herbal medications at this time   Do NOT wear jewelry (body piercing), metal hair clips/bobby pins, make-up, or nail polish. Do NOT wear lotions, powders, or perfumes.  You may wear deodorant. Do NOT shave for 48 hours prior to surgery. Do NOT bring valuables to the hospital. Contacts, dentures, or bridgework may not be worn into surgery.  Have a responsible adult drive you home and stay with you for 24 hours after your procedure

## 2016-08-23 ENCOUNTER — Encounter (HOSPITAL_COMMUNITY)
Admission: RE | Admit: 2016-08-23 | Discharge: 2016-08-23 | Disposition: A | Discharge status: Home / Self Care | Payer: BLUE CROSS/BLUE SHIELD | Source: Ambulatory Visit | Attending: Obstetrics and Gynecology | Admitting: Obstetrics and Gynecology

## 2016-08-23 ENCOUNTER — Encounter (HOSPITAL_COMMUNITY): Payer: Self-pay

## 2016-08-23 DIAGNOSIS — Z01812 Encounter for preprocedural laboratory examination: Secondary | ICD-10-CM | POA: Diagnosis present

## 2016-08-23 HISTORY — DX: Gastro-esophageal reflux disease without esophagitis: K21.9

## 2016-08-23 LAB — CBC
HCT: 42 % (ref 36.0–46.0)
Hemoglobin: 14.8 g/dL (ref 12.0–15.0)
MCH: 35.2 pg — ABNORMAL HIGH (ref 26.0–34.0)
MCHC: 35.2 g/dL (ref 30.0–36.0)
MCV: 100 fL (ref 78.0–100.0)
Platelets: 244 10*3/uL (ref 150–400)
RBC: 4.2 MIL/uL (ref 3.87–5.11)
RDW: 14.4 % (ref 11.5–15.5)
WBC: 8.7 10*3/uL (ref 4.0–10.5)

## 2016-08-23 NOTE — Pre-Procedure Instructions (Signed)
Dr. Jenita Seashore made aware of Ms. Annette Lucas's elevated blood pressures today.  Dr. Glennon Mac requests patient follow up with primary care physician or surgeon in reference to blood pressure.  Dr. Glennon Mac stated if diastolic is greater than [id number] day of surgery her case maybe cancelled.

## 2016-08-31 NOTE — H&P (Signed)
Annette Lucas is an 45 y.o. female. She was seen on 11-9 c/o having left back pain and left lower abdomen pain over the last few weeks. Also having frequent spells of nausea, upset stomach. Also over the last few days having spotting. h/o TVH. Ultrasound 11-9 with 7+ cm complex left ovarian cyst, <3 cm right ovarian simple cyst.  Since she is having pain she would like both ovaries removed.  Pertinent Gynecological History: Last mammogram: normal Date: 09/2015 OB History: G2, P1011   Menstrual History: Patient's last menstrual period was 06/14/2011.    Past Medical History:  Diagnosis Date  . GERD (gastroesophageal reflux disease)   . IBS (irritable bowel syndrome)     Past Surgical History:  Procedure Laterality Date  . CYSTOSCOPY  07/06/2011   Procedure: CYSTOSCOPY;  Surgeon: Clarene Duke, MD;  Location: [facility];  Service: Gynecology;  Laterality: N/A;  . ENDOMETRIAL ABLATION    . VAGINAL HYSTERECTOMY  07/06/2011   Procedure: HYSTERECTOMY VAGINAL;  Surgeon: Clarene Duke, MD;  Location: [facility];  Service: Gynecology;  Laterality: N/A;    No family history on file.  Social History:  reports that she has never smoked. She has never used smokeless tobacco. She reports that she drinks alcohol. She reports that she does not use drugs.  Allergies:  Allergies  Allergen Reactions  . Levofloxacin Anaphylaxis  . Other Anaphylaxis    Meat allergy: Beef, pork, lamb  . Penicillins Anaphylaxis    Has patient had a PCN reaction causing immediate rash, facial/tongue/throat swelling, SOB or lightheadedness with hypotension: Yes Has patient had a PCN reaction causing severe rash involving mucus membranes or skin necrosis: No Has patient had a PCN reaction that required hospitalization Yes Has patient had a PCN reaction occurring within the last 10 years: No If all of the above answers are "NO", then may proceed with Cephalosporin use.     No prescriptions prior to admission.     Review of Systems  Respiratory: Negative.   Cardiovascular: Negative.   Gastrointestinal: Positive for nausea.  Genitourinary: Negative.     Last menstrual period 06/14/2011. Physical Exam  Constitutional: She appears well-developed and well-nourished.  Neck: Neck supple. No thyromegaly present.  Cardiovascular: Normal rate, regular rhythm and normal heart sounds.   No murmur heard. Respiratory: Breath sounds normal. No respiratory distress. She has no wheezes.  GI: Soft. She exhibits no distension and no mass. There is tenderness (BLQ, L>R).  Genitourinary: Vagina normal and uterus normal.  Genitourinary Comments: Left adnexal tenderness and mass    No results found for this or any previous visit (from the past 24 hour(s)).  No results found.  Assessment/Plan: Symptomatic 7 cm left ovarian cyst, as well as 3 cm right ovarian cyst.  Discussed observation and f/u with imaging, also discussed surgical options.  She wants both ovaries removed.  Discussed that this will bring on immediate menopause with possible symptoms.  Discussed surgical procedure and risks.  Will admit for open laparoscopy with pelvic washings and BSO.  Denzel Etienne D 08/31/2016, 5:32 PM

## 2016-09-01 ENCOUNTER — Ambulatory Visit (HOSPITAL_COMMUNITY): Payer: BLUE CROSS/BLUE SHIELD | Admitting: Certified Registered Nurse Anesthetist

## 2016-09-01 ENCOUNTER — Encounter (HOSPITAL_COMMUNITY): Payer: Self-pay | Admitting: Certified Registered Nurse Anesthetist

## 2016-09-01 ENCOUNTER — Encounter (HOSPITAL_COMMUNITY)
Admission: RE | Disposition: A | Discharge status: Home / Self Care | Payer: Self-pay | Source: Ambulatory Visit | Attending: Obstetrics and Gynecology

## 2016-09-01 ENCOUNTER — Ambulatory Visit (HOSPITAL_COMMUNITY)
Admission: RE | Admit: 2016-09-01 | Discharge: 2016-09-01 | Disposition: A | Discharge status: Home / Self Care | Payer: BLUE CROSS/BLUE SHIELD | Source: Ambulatory Visit | Attending: Obstetrics and Gynecology | Admitting: Obstetrics and Gynecology

## 2016-09-01 DIAGNOSIS — N83201 Unspecified ovarian cyst, right side: Secondary | ICD-10-CM | POA: Diagnosis present

## 2016-09-01 DIAGNOSIS — Z880 Allergy status to penicillin: Secondary | ICD-10-CM | POA: Diagnosis not present

## 2016-09-01 DIAGNOSIS — D270 Benign neoplasm of right ovary: Secondary | ICD-10-CM | POA: Diagnosis not present

## 2016-09-01 DIAGNOSIS — Z881 Allergy status to other antibiotic agents status: Secondary | ICD-10-CM | POA: Diagnosis not present

## 2016-09-01 DIAGNOSIS — K589 Irritable bowel syndrome without diarrhea: Secondary | ICD-10-CM | POA: Diagnosis not present

## 2016-09-01 DIAGNOSIS — Z91018 Allergy to other foods: Secondary | ICD-10-CM | POA: Diagnosis not present

## 2016-09-01 DIAGNOSIS — D271 Benign neoplasm of left ovary: Secondary | ICD-10-CM | POA: Diagnosis not present

## 2016-09-01 DIAGNOSIS — Z9889 Other specified postprocedural states: Secondary | ICD-10-CM | POA: Insufficient documentation

## 2016-09-01 DIAGNOSIS — I10 Essential (primary) hypertension: Secondary | ICD-10-CM | POA: Insufficient documentation

## 2016-09-01 DIAGNOSIS — Z90710 Acquired absence of both cervix and uterus: Secondary | ICD-10-CM | POA: Insufficient documentation

## 2016-09-01 DIAGNOSIS — K219 Gastro-esophageal reflux disease without esophagitis: Secondary | ICD-10-CM | POA: Insufficient documentation

## 2016-09-01 DIAGNOSIS — N83202 Unspecified ovarian cyst, left side: Secondary | ICD-10-CM | POA: Diagnosis present

## 2016-09-01 HISTORY — PX: LAPAROSCOPIC SALPINGO OOPHERECTOMY: SHX5927

## 2016-09-01 SURGERY — SALPINGO-OOPHORECTOMY, LAPAROSCOPIC
Anesthesia: General | Site: Abdomen | Laterality: Bilateral

## 2016-09-01 MED ORDER — MIDAZOLAM HCL 2 MG/2ML IJ SOLN
INTRAMUSCULAR | Status: DC | PRN
  Administered 2016-09-01: 2 mg via INTRAVENOUS

## 2016-09-01 MED ORDER — KETOROLAC TROMETHAMINE 30 MG/ML IJ SOLN
INTRAMUSCULAR | Status: AC
  Filled 2016-09-01: qty 1

## 2016-09-01 MED ORDER — BUPIVACAINE HCL (PF) 0.25 % IJ SOLN
INTRAMUSCULAR | Status: DC | PRN
  Administered 2016-09-01: 20 mL

## 2016-09-01 MED ORDER — DEXAMETHASONE SODIUM PHOSPHATE 10 MG/ML IJ SOLN
INTRAMUSCULAR | Status: DC | PRN
  Administered 2016-09-01: 4 mg via INTRAVENOUS

## 2016-09-01 MED ORDER — SUGAMMADEX SODIUM 200 MG/2ML IV SOLN
INTRAVENOUS | Status: AC
  Filled 2016-09-01: qty 2

## 2016-09-01 MED ORDER — DEXAMETHASONE SODIUM PHOSPHATE 4 MG/ML IJ SOLN
INTRAMUSCULAR | Status: AC
  Filled 2016-09-01: qty 1

## 2016-09-01 MED ORDER — BUPIVACAINE HCL (PF) 0.25 % IJ SOLN
INTRAMUSCULAR | Status: AC
  Filled 2016-09-01: qty 30

## 2016-09-01 MED ORDER — SCOPOLAMINE 1 MG/3DAYS TD PT72
1.0000 | MEDICATED_PATCH | Freq: Once | TRANSDERMAL | Status: DC
  Administered 2016-09-01: 1.5 mg via TRANSDERMAL

## 2016-09-01 MED ORDER — METOCLOPRAMIDE HCL 5 MG/ML IJ SOLN: 10.0000 mg | Freq: Once | INTRAMUSCULAR | Status: DC | PRN

## 2016-09-01 MED ORDER — ROCURONIUM BROMIDE 100 MG/10ML IV SOLN
INTRAVENOUS | Status: DC | PRN
Start: 2016-09-01 — End: 2016-09-01
  Administered 2016-09-01: 40 mg via INTRAVENOUS

## 2016-09-01 MED ORDER — MIDAZOLAM HCL 2 MG/2ML IJ SOLN
INTRAMUSCULAR | Status: AC
  Filled 2016-09-01: qty 2

## 2016-09-01 MED ORDER — ONDANSETRON HCL 4 MG/2ML IJ SOLN
INTRAMUSCULAR | Status: DC | PRN
  Administered 2016-09-01: 4 mg via INTRAVENOUS

## 2016-09-01 MED ORDER — FENTANYL CITRATE (PF) 100 MCG/2ML IJ SOLN
INTRAMUSCULAR | Status: DC | PRN
  Administered 2016-09-01: 50 ug via INTRAVENOUS
  Administered 2016-09-01 (×2): 100 ug via INTRAVENOUS

## 2016-09-01 MED ORDER — SCOPOLAMINE 1 MG/3DAYS TD PT72
MEDICATED_PATCH | TRANSDERMAL | Status: AC
  Filled 2016-09-01: qty 1

## 2016-09-01 MED ORDER — ONDANSETRON HCL 4 MG/2ML IJ SOLN
INTRAMUSCULAR | Status: AC
  Filled 2016-09-01: qty 2

## 2016-09-01 MED ORDER — HYDROCODONE-ACETAMINOPHEN 7.5-325 MG PO TABS
ORAL_TABLET | ORAL | Status: AC
  Filled 2016-09-01: qty 1

## 2016-09-01 MED ORDER — FENTANYL CITRATE (PF) 100 MCG/2ML IJ SOLN
INTRAMUSCULAR | Status: AC
  Filled 2016-09-01: qty 2

## 2016-09-01 MED ORDER — FENTANYL CITRATE (PF) 100 MCG/2ML IJ SOLN
25.0000 ug | INTRAMUSCULAR | Status: DC | PRN
  Administered 2016-09-01: 50 ug via INTRAVENOUS
  Administered 2016-09-01 (×2): 25 ug via INTRAVENOUS

## 2016-09-01 MED ORDER — PROPOFOL 10 MG/ML IV BOLUS
INTRAVENOUS | Status: AC
  Filled 2016-09-01: qty 20

## 2016-09-01 MED ORDER — LACTATED RINGERS IV SOLN
INTRAVENOUS | Status: DC
  Administered 2016-09-01 (×2): via INTRAVENOUS

## 2016-09-01 MED ORDER — SUGAMMADEX SODIUM 200 MG/2ML IV SOLN
INTRAVENOUS | Status: DC | PRN
  Administered 2016-09-01: 150 mg via INTRAVENOUS

## 2016-09-01 MED ORDER — MEPERIDINE HCL 25 MG/ML IJ SOLN: 6.2500 mg | INTRAMUSCULAR | Status: DC | PRN

## 2016-09-01 MED ORDER — LIDOCAINE HCL (CARDIAC) 20 MG/ML IV SOLN
INTRAVENOUS | Status: DC | PRN
  Administered 2016-09-01: 50 mg via INTRAVENOUS

## 2016-09-01 MED ORDER — SCOPOLAMINE 1 MG/3DAYS TD PT72: 1.0000 | MEDICATED_PATCH | TRANSDERMAL | Status: DC

## 2016-09-01 MED ORDER — HYDROCODONE-ACETAMINOPHEN 5-325 MG PO TABS: 1.0000 | ORAL_TABLET | Freq: Four times a day (QID) | ORAL | 0 refills | Status: DC | PRN

## 2016-09-01 MED ORDER — LACTATED RINGERS IR SOLN
Status: DC | PRN
Start: 2016-09-01 — End: 2016-09-01
  Administered 2016-09-01: 3000 mL

## 2016-09-01 MED ORDER — LIDOCAINE HCL (PF) 1 % IJ SOLN
INTRAMUSCULAR | Status: AC
  Filled 2016-09-01: qty 5

## 2016-09-01 MED ORDER — HYDROCODONE-ACETAMINOPHEN 7.5-325 MG PO TABS
1.0000 | ORAL_TABLET | Freq: Once | ORAL | Status: AC | PRN
Start: 2016-09-01 — End: 2016-09-01
  Administered 2016-09-01: 1 via ORAL

## 2016-09-01 MED ORDER — KETOROLAC TROMETHAMINE 30 MG/ML IJ SOLN
INTRAMUSCULAR | Status: DC | PRN
  Administered 2016-09-01: 30 mg via INTRAVENOUS

## 2016-09-01 MED ORDER — PROPOFOL 10 MG/ML IV BOLUS
INTRAVENOUS | Status: DC | PRN
  Administered 2016-09-01: 150 mg via INTRAVENOUS

## 2016-09-01 MED ORDER — FENTANYL CITRATE (PF) 250 MCG/5ML IJ SOLN
INTRAMUSCULAR | Status: AC
  Filled 2016-09-01: qty 5

## 2016-09-01 SURGICAL SUPPLY — 39 items
CABLE HIGH FREQUENCY MONO STRZ (ELECTRODE) IMPLANT
CATH ROBINSON RED A/P 16FR (CATHETERS) IMPLANT
CLOTH BEACON ORANGE TIMEOUT ST (SAFETY) ×2 IMPLANT
DECANTER SPIKE VIAL GLASS SM (MISCELLANEOUS) ×2 IMPLANT
DERMABOND ADVANCED (GAUZE/BANDAGES/DRESSINGS) ×1
DERMABOND ADVANCED .7 DNX12 (GAUZE/BANDAGES/DRESSINGS) ×1 IMPLANT
DRSG OPSITE POSTOP 3X4 (GAUZE/BANDAGES/DRESSINGS) ×2 IMPLANT
DURAPREP 26ML APPLICATOR (WOUND CARE) ×2 IMPLANT
GLOVE BIO SURGEON STRL SZ8 (GLOVE) ×2 IMPLANT
GLOVE BIOGEL PI IND STRL 7.0 (GLOVE) ×1 IMPLANT
GLOVE BIOGEL PI INDICATOR 7.0 (GLOVE) ×1
GLOVE ORTHO TXT STRL SZ7.5 (GLOVE) ×2 IMPLANT
GOWN STRL REUS W/TWL LRG LVL3 (GOWN DISPOSABLE) ×4 IMPLANT
GOWN STRL REUS W/TWL XL LVL3 (GOWN DISPOSABLE) ×2 IMPLANT
NEEDLE EPID 17G 5 ECHO TUOHY (NEEDLE) IMPLANT
NEEDLE INSUFFLATION 120MM (ENDOMECHANICALS) ×2 IMPLANT
NS IRRIG 1000ML POUR BTL (IV SOLUTION) ×2 IMPLANT
PACK LAPAROSCOPY BASIN (CUSTOM PROCEDURE TRAY) ×2 IMPLANT
PACK TRENDGUARD 450 HYBRID PRO (MISCELLANEOUS) ×1 IMPLANT
PACK TRENDGUARD 600 HYBRD PROC (MISCELLANEOUS) IMPLANT
PENCIL BUTTON HOLSTER BLD 10FT (ELECTRODE) ×2 IMPLANT
POUCH SPECIMEN RETRIEVAL 10MM (ENDOMECHANICALS) ×2 IMPLANT
PROTECTOR NERVE ULNAR (MISCELLANEOUS) ×4 IMPLANT
SET IRRIG TUBING LAPAROSCOPIC (IRRIGATION / IRRIGATOR) ×2 IMPLANT
SHEARS HARMONIC ACE PLUS 36CM (ENDOMECHANICALS) IMPLANT
SLEEVE XCEL OPT CAN 5 100 (ENDOMECHANICALS) ×2 IMPLANT
SOLUTION ELECTROLUBE (MISCELLANEOUS) IMPLANT
SUT VICRYL 0 UR6 27IN ABS (SUTURE) IMPLANT
SUT VICRYL 4-0 PS2 18IN ABS (SUTURE) ×2 IMPLANT
TOWEL OR 17X24 6PK STRL BLUE (TOWEL DISPOSABLE) ×4 IMPLANT
TRAY FOLEY CATH SILVER 14FR (SET/KITS/TRAYS/PACK) ×2 IMPLANT
TRENDGUARD 450 HYBRID PRO PACK (MISCELLANEOUS) ×2
TRENDGUARD 600 HYBRID PROC PK (MISCELLANEOUS)
TROCAR XCEL NON-BLD 11X100MML (ENDOMECHANICALS) ×2 IMPLANT
TROCAR XCEL NON-BLD 5MMX100MML (ENDOMECHANICALS) ×2 IMPLANT
TUBING NON-CON 1/4 X 20 CONN (TUBING) ×2 IMPLANT
WARMER LAPAROSCOPE (MISCELLANEOUS) ×2 IMPLANT
WATER STERILE IRR 1000ML POUR (IV SOLUTION) IMPLANT
YANKAUER SUCT BULB TIP NO VENT (SUCTIONS) ×2 IMPLANT

## 2016-09-01 NOTE — Anesthesia Procedure Notes (Signed)
Procedure Name: Intubation Date/Time: 09/01/2016 10:28 AM Performed by: Bufford Spikes Pre-anesthesia Checklist: Patient identified, Emergency Drugs available, Suction available and Patient being monitored Patient Re-evaluated:Patient Re-evaluated prior to inductionOxygen Delivery Method: Circle system utilized Preoxygenation: Pre-oxygenation with 100% oxygen Intubation Type: IV induction Ventilation: Mask ventilation without difficulty Laryngoscope Size: Miller and 2 Grade View: Grade II Tube type: Oral Tube size: 7.0 mm Number of attempts: 1 Airway Equipment and Method: Stylet and Oral airway Placement Confirmation: ETT inserted through vocal cords under direct vision,  positive ETCO2 and breath sounds checked- equal and bilateral Secured at: 21 cm Tube secured with: Tape Dental Injury: Teeth and Oropharynx as per pre-operative assessment

## 2016-09-01 NOTE — Anesthesia Preprocedure Evaluation (Signed)
Anesthesia Evaluation  Patient identified by MRN, date of birth, ID band Patient awake    Reviewed: Allergy & Precautions, NPO status , Patient's Chart, lab work & pertinent test results  Airway Mallampati: I  TM Distance: >3 FB Neck ROM: Full    Dental no notable dental hx. (+) Teeth Intact   Pulmonary neg pulmonary ROS,    Pulmonary exam normal breath sounds clear to auscultation       Cardiovascular hypertension,  Rhythm:Regular Rate:Normal  On no medications   Neuro/Psych negative neurological ROS  negative psych ROS   GI/Hepatic Neg liver ROS, GERD  Medicated and Controlled,  Endo/Other  negative endocrine ROS  Renal/GU negative Renal ROS  negative genitourinary   Musculoskeletal negative musculoskeletal ROS (+)   Abdominal   Peds  Hematology negative hematology ROS (+)   Anesthesia Other Findings   Reproductive/Obstetrics Complex left ovarian cyst                               Chemistry   No results found for: NA, K, CL, CO2, BUN, CREATININE, GLU No results found for: CALCIUM, ALKPHOS, AST, ALT, BILITOT   Lab Results  Component Value Date   WBC 8.7 08/23/2016   HGB 14.8 08/23/2016   HCT 42.0 08/23/2016   MCV 100.0 08/23/2016   PLT 244 08/23/2016    Anesthesia Physical Anesthesia Plan  ASA: II  Anesthesia Plan: General   Post-op Pain Management:    Induction: Intravenous  Airway Management Planned: Oral ETT  Additional Equipment:   Intra-op Plan:   Post-operative Plan: Extubation in OR  Informed Consent: I have reviewed the patients History and Physical, chart, labs and discussed the procedure including the risks, benefits and alternatives for the proposed anesthesia with the patient or authorized representative who has indicated his/her understanding and acceptance.   Dental advisory given  Plan Discussed with: Anesthesiologist, CRNA and Surgeon  Anesthesia  Plan Comments:         Anesthesia Quick Evaluation

## 2016-09-01 NOTE — Discharge Instructions (Addendum)
DISCHARGE INSTRUCTIONS: Laparoscopy  The following instructions have been prepared to help you care for yourself upon your return home today.  Wound care:  Do not get the incision wet for the first 24 hours. The incision should be kept clean and dry.  The Band-Aids or dressings may be removed the day after surgery.  Should the incision become sore, red, and swollen after the first week, check with your doctor.  Personal hygiene:  Shower the day after your procedure.  Activity and limitations:  Do NOT drive or operate any equipment today.  Do NOT lift anything more than 15 pounds for 2-3 weeks after surgery.  Do NOT rest in bed all day.  Walking is encouraged. Walk each day, starting slowly with 5-minute walks 3 or 4 times a day. Slowly increase the length of your walks.  Walk up and down stairs slowly.  Do NOT do strenuous activities, such as golfing, playing tennis, bowling, running, biking, weight lifting, gardening, mowing, or vacuuming for 2-4 weeks. Ask your doctor when it is okay to start.  Diet: Eat a light meal as desired this evening. You may resume your usual diet tomorrow.  Return to work: This is dependent on the type of work you do. For the most part you can return to a desk job within a week of surgery. If you are more active at work, please discuss this with your doctor.  What to expect after your surgery: You may have a slight burning sensation when you urinate on the first day. You may have a very small amount of blood in the urine. Expect to have a small amount of vaginal discharge/light bleeding for 1-2 weeks. It is not unusual to have abdominal soreness and bruising for up to 2 weeks. You may be tired and need more rest for about 1 week. You may experience shoulder pain for 24-72 hours. Lying flat in bed may relieve it.  Call your doctor for any of the following:  Develop a fever of 100.4 or greater  Inability to urinate 6 hours after discharge from  hospital  Severe pain not relieved by pain medications  Persistent of heavy bleeding at incision site  Redness or swelling around incision site after a week  Increasing nausea or vomiting  Patient Signature________________________________________ Nurse Signature_________________________________________ Post Anesthesia Home Care Instructions  NO IBUPROFEN PRODUCTS UNTIL: 5:20 PM TODAY  Activity: Get plenty of rest for the remainder of the day. A responsible adult should stay with you for 24 hours following the procedure.  For the next 24 hours, DO NOT: -Drive a car -Paediatric nurse -Drink alcoholic beverages -Take any medication unless instructed by your physician -Make any legal decisions or sign important papers.  Meals: Start with liquid foods such as gelatin or soup. Progress to regular foods as tolerated. Avoid greasy, spicy, heavy foods. If nausea and/or vomiting occur, drink only clear liquids until the nausea and/or vomiting subsides. Call your physician if vomiting continues.  Special Instructions/Symptoms: Your throat may feel dry or sore from the anesthesia or the breathing tube placed in your throat during surgery. If this causes discomfort, gargle with warm salt water. The discomfort should disappear within 24 hours.  If you had a scopolamine patch placed behind your ear for the management of post- operative nausea and/or vomiting:  1. The medication in the patch is effective for 72 hours, after which it should be removed.  Wrap patch in a tissue and discard in the trash. Wash hands thoroughly with soap  and water. 2. You may remove the patch earlier than 72 hours if you experience unpleasant side effects which may include dry mouth, dizziness or visual disturbances. 3. Avoid touching the patch. Wash your hands with soap and water after contact with the patch.

## 2016-09-01 NOTE — Transfer of Care (Signed)
Immediate Anesthesia Transfer of Care Note  Patient: Leonilda Cabreros  Procedure(s) Performed: Procedure(s): LAPAROSCOPIC SALPINGO OOPHORECTOMY (Bilateral)  Patient Location: PACU  Anesthesia Type:General  Level of Consciousness: awake, alert  and oriented  Airway & Oxygen Therapy: Patient Spontanous Breathing and Patient connected to nasal cannula oxygen  Post-op Assessment: Report given to RN and Post -op Vital signs reviewed and stable  Post vital signs: Reviewed and stable  Last Vitals:  Vitals:   09/01/16 0838  BP: (!) 163/93  Pulse: 84  Resp: 16  Temp: 36.6 C    Last Pain:  Vitals:   09/01/16 0838  TempSrc: Oral      Patients Stated Pain Goal: 3 ([id number])  Complications: No apparent anesthesia complications

## 2016-09-01 NOTE — Op Note (Signed)
Obstetrics Op Note 03/07/2015 8:27 AM    Expand All Collapse All   Preoperative diagnosis: Large left ovarian cyst, smaller right ovarian cyst Postoperative diagnosis: Same Procedure: Open laparoscopy with bilateral salpingo-oophorectomy Surgeon: Cheri Fowler M.D. Assistant:  Crawford Givens, D.O. Anesthesia: Gen. Endotracheal tube Findings: She had a significantly enlarged left ovary, also a 4 cm cyst on right ovary that leaked old blood.  Right tube was adherent to right pelvic sidewall.  Some bowel adherent RUQ.  Pelvis otherwise normal with possibly one small area of endometriosis Estimated blood loss: Minimal Specimens: bilateral tubes and ovaires for routine pathology, pelvic washings also Complications: None  Procedure in detail  The patient was taken to the operating room and placed in the dorsal supine position. General anesthesia was induced. Both arms tucked to her side and legs were placed in the mobile stirrups. Abdomen perineum and vagina were then prepped and draped in the usual sterile fashion, bladder drained with a foley catheter. Infraumbilical skin was then infiltrated with quarter percent Marcaine and a 3 cm horizontal incision was made. Fascia was identified and elevated and entered sharply. Some bleeding was controlled with the Bovie.  Peritoneum was then also elevated and entered sharply. The incision was extended bilaterally. A pursestring suture of 0 Vicryl was placed around the fascia and the Hassan cannula was inserted and secured. Abdomen was insufflated with CO2 and good visualization was achieved. 5 mm ports were placed on each side under direct visualization. Inspection revealed the above-mentioned findings. Pelvic washings were obtained.  Harmonic scalpel was used to free each infundibulopelvic ligament and all other attachments of tubes and ovaries to the sidewalls. The right ovary had some old bloody contents to a cyst.  The 5 mm scope was then placed through the  port on the right and an Endo Catch bag was placed through the umbilical port. Both tubes and ovaries were scooped into the bag and brought to the umbilical incision.The pelvis was irrigated and noted to be hemostatic. I used a spinal needle and a 10cc syringe to evacuate 30 cc clear fluid from the largest ovarian cyst while it was in the bag.  The bag then delivered with both tubes and ovaries intact.  The pursestring suture was tied and achieved good fascial closure.   5 mm ports were removed. All skin incisions were closed with subcutaneous sutures of 4-0 Vicryl followed by Dermabond. The foley catheter was removed. The patient was awakened in the upper abdomen taken to the recovery room in stable condition after tolerating the procedure well. Counts were correct and she had PAS hose on throughout the procedure.

## 2016-09-01 NOTE — Anesthesia Postprocedure Evaluation (Signed)
Anesthesia Post Note  Patient: Annette Lucas  Procedure(s) Performed: Procedure(s) (LRB): LAPAROSCOPIC SALPINGO OOPHORECTOMY (Bilateral)  Patient location during evaluation: PACU Anesthesia Type: General Level of consciousness: awake and alert and oriented Pain management: pain level controlled Vital Signs Assessment: post-procedure vital signs reviewed and stable Respiratory status: spontaneous breathing, nonlabored ventilation and respiratory function stable Cardiovascular status: blood pressure returned to baseline and stable Postop Assessment: no signs of nausea or vomiting Anesthetic complications: no     Last Vitals:  Vitals:   09/01/16 1250 09/01/16 1315  BP: (!) 142/99 (!) 139/91  Pulse:  87  Resp:  14  Temp: 36.9 C     Last Pain:  Vitals:   09/01/16 1250  TempSrc:   PainSc: 3    Pain Goal: Patients Stated Pain Goal: 3 (09/01/16 1250)               [place] Dube A.

## 2016-09-01 NOTE — Interval H&P Note (Signed)
History and Physical Interval Note:  09/01/2016 9:34 AM  Annette Lucas  has presented today for surgery, with the diagnosis of Complex left ovarian cyst  The various methods of treatment have been discussed with the patient and family. After consideration of risks, benefits and other options for treatment, the patient has consented to  Procedure(s): LAPAROSCOPIC SALPINGO OOPHORECTOMY (Bilateral) as a surgical intervention .  The patient's history has been reviewed, patient examined, no change in status, stable for surgery.  I have reviewed the patient's chart and labs.  Questions were answered to the patient's satisfaction.     Naveena Eyman D

## 2016-09-02 ENCOUNTER — Encounter (HOSPITAL_COMMUNITY): Payer: Self-pay | Admitting: Obstetrics and Gynecology

## 2020-01-15 ENCOUNTER — Encounter (HOSPITAL_COMMUNITY): Payer: Self-pay | Admitting: *Deleted

## 2020-01-15 ENCOUNTER — Emergency Department (HOSPITAL_COMMUNITY): Payer: Medicaid Other

## 2020-01-15 ENCOUNTER — Other Ambulatory Visit: Payer: Self-pay

## 2020-01-15 ENCOUNTER — Inpatient Hospital Stay (HOSPITAL_COMMUNITY)
Admission: EM | Admit: 2020-01-15 | Discharge: 2020-01-19 | DRG: 690 | Disposition: A | Discharge status: Home Health | Payer: Medicaid Other | Attending: Internal Medicine | Admitting: Internal Medicine

## 2020-01-15 DIAGNOSIS — F1010 Alcohol abuse, uncomplicated: Secondary | ICD-10-CM | POA: Diagnosis present

## 2020-01-15 DIAGNOSIS — N12 Tubulo-interstitial nephritis, not specified as acute or chronic: Principal | ICD-10-CM | POA: Diagnosis present

## 2020-01-15 DIAGNOSIS — E876 Hypokalemia: Secondary | ICD-10-CM | POA: Diagnosis present

## 2020-01-15 DIAGNOSIS — D539 Nutritional anemia, unspecified: Secondary | ICD-10-CM | POA: Diagnosis present

## 2020-01-15 DIAGNOSIS — R195 Other fecal abnormalities: Secondary | ICD-10-CM

## 2020-01-15 DIAGNOSIS — N10 Acute tubulo-interstitial nephritis: Secondary | ICD-10-CM

## 2020-01-15 DIAGNOSIS — K2920 Alcoholic gastritis without bleeding: Secondary | ICD-10-CM | POA: Diagnosis present

## 2020-01-15 DIAGNOSIS — R7989 Other specified abnormal findings of blood chemistry: Secondary | ICD-10-CM

## 2020-01-15 DIAGNOSIS — Z880 Allergy status to penicillin: Secondary | ICD-10-CM

## 2020-01-15 DIAGNOSIS — K7010 Alcoholic hepatitis without ascites: Secondary | ICD-10-CM | POA: Diagnosis present

## 2020-01-15 DIAGNOSIS — K922 Gastrointestinal hemorrhage, unspecified: Secondary | ICD-10-CM

## 2020-01-15 DIAGNOSIS — R0789 Other chest pain: Secondary | ICD-10-CM | POA: Diagnosis present

## 2020-01-15 DIAGNOSIS — K529 Noninfective gastroenteritis and colitis, unspecified: Secondary | ICD-10-CM

## 2020-01-15 DIAGNOSIS — D649 Anemia, unspecified: Secondary | ICD-10-CM

## 2020-01-15 DIAGNOSIS — Z881 Allergy status to other antibiotic agents status: Secondary | ICD-10-CM

## 2020-01-15 DIAGNOSIS — G621 Alcoholic polyneuropathy: Secondary | ICD-10-CM | POA: Diagnosis present

## 2020-01-15 DIAGNOSIS — Z20822 Contact with and (suspected) exposure to covid-19: Secondary | ICD-10-CM | POA: Diagnosis present

## 2020-01-15 DIAGNOSIS — R945 Abnormal results of liver function studies: Secondary | ICD-10-CM

## 2020-01-15 HISTORY — DX: Allergy to other foods: Z91.018

## 2020-01-15 LAB — URINALYSIS, ROUTINE W REFLEX MICROSCOPIC
Bilirubin Urine: NEGATIVE
Glucose, UA: NEGATIVE mg/dL
Ketones, ur: NEGATIVE mg/dL
Nitrite: NEGATIVE
Protein, ur: NEGATIVE mg/dL
Specific Gravity, Urine: 1.041 — ABNORMAL HIGH (ref 1.005–1.030)
WBC, UA: >50 WBC/hpf — ABNORMAL HIGH (ref 0–5)
pH: 6 (ref 5.0–8.0)

## 2020-01-15 LAB — CBC WITH DIFFERENTIAL/PLATELET
Abs Immature Granulocytes: 0.06 10*3/uL (ref 0.00–0.07)
Basophils Absolute: 0 10*3/uL (ref 0.0–0.1)
Basophils Relative: 0 %
Eosinophils Absolute: 0 10*3/uL (ref 0.0–0.5)
Eosinophils Relative: 0 %
HCT: 30 % — ABNORMAL LOW (ref 36.0–46.0)
Hemoglobin: 10 g/dL — ABNORMAL LOW (ref 12.0–15.0)
Immature Granulocytes: 1 %
Lymphocytes Relative: 16 %
Lymphs Abs: 1.9 10*3/uL (ref 0.7–4.0)
MCH: 37.6 pg — ABNORMAL HIGH (ref 26.0–34.0)
MCHC: 33.3 g/dL (ref 30.0–36.0)
MCV: 112.8 fL — ABNORMAL HIGH (ref 80.0–100.0)
Monocytes Absolute: 0.7 10*3/uL (ref 0.1–1.0)
Monocytes Relative: 5 %
Neutro Abs: 9.7 10*3/uL — ABNORMAL HIGH (ref 1.7–7.7)
Neutrophils Relative %: 78 %
Platelets: 282 10*3/uL (ref 150–400)
RBC: 2.66 MIL/uL — ABNORMAL LOW (ref 3.87–5.11)
RDW: 19.9 % — ABNORMAL HIGH (ref 11.5–15.5)
WBC: 12.4 10*3/uL — ABNORMAL HIGH (ref 4.0–10.5)
nRBC: 0 % (ref 0.0–0.2)

## 2020-01-15 LAB — COMPREHENSIVE METABOLIC PANEL
ALT: 43 U/L (ref 0–44)
AST: 76 U/L — ABNORMAL HIGH (ref 15–41)
Albumin: 2.2 g/dL — ABNORMAL LOW (ref 3.5–5.0)
Alkaline Phosphatase: 214 U/L — ABNORMAL HIGH (ref 38–126)
Anion gap: 15 (ref 5–15)
BUN: 14 mg/dL (ref 6–20)
CO2: 25 mmol/L (ref 22–32)
Calcium: 8 mg/dL — ABNORMAL LOW (ref 8.9–10.3)
Chloride: 94 mmol/L — ABNORMAL LOW (ref 98–111)
Creatinine, Ser: 0.57 mg/dL (ref 0.44–1.00)
GFR calc Af Amer: >60 mL/min (ref >60)
GFR calc non Af Amer: >60 mL/min (ref >60)
Glucose, Bld: 104 mg/dL — ABNORMAL HIGH (ref 70–99)
Potassium: 2.3 mmol/L — CL (ref 3.5–5.1)
Sodium: 134 mmol/L — ABNORMAL LOW (ref 135–145)
Total Bilirubin: 2.2 mg/dL — ABNORMAL HIGH (ref 0.3–1.2)
Total Protein: 6.1 g/dL — ABNORMAL LOW (ref 6.5–8.1)

## 2020-01-15 LAB — TROPONIN I (HIGH SENSITIVITY)
Troponin I (High Sensitivity): 4 ng/L (ref <18)
Troponin I (High Sensitivity): 4 ng/L (ref <18)

## 2020-01-15 LAB — ETHANOL: Alcohol, Ethyl (B): <10 mg/dL (ref <10)

## 2020-01-15 LAB — POC OCCULT BLOOD, ED: Fecal Occult Bld: POSITIVE — AB

## 2020-01-15 LAB — MAGNESIUM: Magnesium: 1.5 mg/dL — ABNORMAL LOW (ref 1.7–2.4)

## 2020-01-15 LAB — POC SARS CORONAVIRUS 2 AG -  ED: SARS Coronavirus 2 Ag: NEGATIVE

## 2020-01-15 LAB — FERRITIN: Ferritin: 326 ng/mL — ABNORMAL HIGH (ref 11–307)

## 2020-01-15 LAB — LIPASE, BLOOD: Lipase: 17 U/L (ref 11–51)

## 2020-01-15 LAB — PHOSPHORUS: Phosphorus: 1.4 mg/dL — ABNORMAL LOW (ref 2.5–4.6)

## 2020-01-15 LAB — POC URINE PREG, ED: Preg Test, Ur: NEGATIVE

## 2020-01-15 MED ORDER — ADULT MULTIVITAMIN W/MINERALS CH
1.0000 | ORAL_TABLET | Freq: Every day | ORAL | Status: DC
  Administered 2020-01-15 – 2020-01-19 (×5): 1 via ORAL
  Filled 2020-01-15 (×5): qty 1

## 2020-01-15 MED ORDER — ACETAMINOPHEN 650 MG RE SUPP: 650.0000 mg | Freq: Four times a day (QID) | RECTAL | Status: DC | PRN

## 2020-01-15 MED ORDER — PANTOPRAZOLE SODIUM 40 MG IV SOLR
40.0000 mg | Freq: Once | INTRAVENOUS | Status: AC
  Administered 2020-01-15: 40 mg via INTRAVENOUS
  Filled 2020-01-15: qty 40

## 2020-01-15 MED ORDER — THIAMINE HCL 100 MG PO TABS
100.0000 mg | ORAL_TABLET | Freq: Every day | ORAL | Status: DC
  Administered 2020-01-16 – 2020-01-19 (×4): 100 mg via ORAL
  Filled 2020-01-15 (×4): qty 1

## 2020-01-15 MED ORDER — POTASSIUM CHLORIDE 10 MEQ/100ML IV SOLN
10.0000 meq | INTRAVENOUS | Status: DC
  Administered 2020-01-15: 21:00:00 10 meq via INTRAVENOUS

## 2020-01-15 MED ORDER — LORAZEPAM 2 MG/ML IJ SOLN
1.0000 mg | INTRAMUSCULAR | Status: AC | PRN
  Administered 2020-01-16: 2 mg via INTRAVENOUS
  Filled 2020-01-15: qty 1

## 2020-01-15 MED ORDER — ACETAMINOPHEN 325 MG PO TABS
650.0000 mg | ORAL_TABLET | Freq: Four times a day (QID) | ORAL | Status: DC | PRN
  Administered 2020-01-16 – 2020-01-17 (×2): 650 mg via ORAL
  Filled 2020-01-15 (×2): qty 2

## 2020-01-15 MED ORDER — FENTANYL CITRATE (PF) 100 MCG/2ML IJ SOLN
100.0000 ug | Freq: Once | INTRAMUSCULAR | Status: AC
  Administered 2020-01-15: 18:00:00 100 ug via INTRAVENOUS
  Filled 2020-01-15: qty 2

## 2020-01-15 MED ORDER — LORAZEPAM 2 MG/ML IJ SOLN
0.0000 mg | Freq: Four times a day (QID) | INTRAMUSCULAR | Status: AC
  Administered 2020-01-16: 2 mg via INTRAVENOUS
  Filled 2020-01-15: qty 1

## 2020-01-15 MED ORDER — METOCLOPRAMIDE HCL 5 MG/ML IJ SOLN
10.0000 mg | Freq: Once | INTRAMUSCULAR | Status: AC
  Administered 2020-01-15: 10 mg via INTRAVENOUS
  Filled 2020-01-15: qty 2

## 2020-01-15 MED ORDER — SODIUM CHLORIDE 0.9% FLUSH
3.0000 mL | Freq: Once | INTRAVENOUS | Status: AC
  Administered 2020-01-15: 18:00:00 3 mL via INTRAVENOUS

## 2020-01-15 MED ORDER — FOLIC ACID 1 MG PO TABS
1.0000 mg | ORAL_TABLET | Freq: Every day | ORAL | Status: DC
  Administered 2020-01-16 – 2020-01-19 (×4): 1 mg via ORAL
  Filled 2020-01-15 (×4): qty 1

## 2020-01-15 MED ORDER — POTASSIUM CHLORIDE 10 MEQ/100ML IV SOLN
10.0000 meq | Freq: Once | INTRAVENOUS | Status: AC
  Administered 2020-01-15: 10 meq via INTRAVENOUS
  Filled 2020-01-15: qty 100

## 2020-01-15 MED ORDER — POTASSIUM CHLORIDE CRYS ER 20 MEQ PO TBCR
40.0000 meq | EXTENDED_RELEASE_TABLET | Freq: Once | ORAL | Status: AC
  Administered 2020-01-15: 23:00:00 40 meq via ORAL
  Filled 2020-01-15: qty 2

## 2020-01-15 MED ORDER — LORAZEPAM 2 MG/ML IJ SOLN
1.0000 mg | Freq: Once | INTRAMUSCULAR | Status: AC
  Administered 2020-01-15: 1 mg via INTRAVENOUS
  Filled 2020-01-15: qty 1

## 2020-01-15 MED ORDER — CALCIUM CARBONATE ANTACID 500 MG PO CHEW
1.0000 | CHEWABLE_TABLET | Freq: Once | ORAL | Status: AC
  Administered 2020-01-15: 200 mg via ORAL
  Filled 2020-01-15: qty 1

## 2020-01-15 MED ORDER — THIAMINE HCL 100 MG/ML IJ SOLN: 100.0000 mg | Freq: Every day | INTRAMUSCULAR | Status: DC

## 2020-01-15 MED ORDER — ONDANSETRON HCL 4 MG/2ML IJ SOLN: 4.0000 mg | Freq: Once | INTRAMUSCULAR | Status: DC

## 2020-01-15 MED ORDER — POTASSIUM CHLORIDE 10 MEQ/100ML IV SOLN
10.0000 meq | Freq: Once | INTRAVENOUS | Status: DC
  Filled 2020-01-15: qty 100

## 2020-01-15 MED ORDER — LORAZEPAM 2 MG/ML IJ SOLN: 0.0000 mg | Freq: Two times a day (BID) | INTRAMUSCULAR | Status: DC

## 2020-01-15 MED ORDER — ONDANSETRON HCL 4 MG PO TABS: 4.0000 mg | ORAL_TABLET | Freq: Four times a day (QID) | ORAL | Status: DC | PRN

## 2020-01-15 MED ORDER — MAGNESIUM SULFATE 2 GM/50ML IV SOLN
2.0000 g | Freq: Once | INTRAVENOUS | Status: AC
  Administered 2020-01-15: 2 g via INTRAVENOUS
  Filled 2020-01-15: qty 50

## 2020-01-15 MED ORDER — ONDANSETRON HCL 4 MG/2ML IJ SOLN: 4.0000 mg | Freq: Four times a day (QID) | INTRAMUSCULAR | Status: DC | PRN

## 2020-01-15 MED ORDER — POTASSIUM CHLORIDE 10 MEQ/100ML IV SOLN
10.0000 meq | INTRAVENOUS | Status: AC
  Administered 2020-01-15 (×2): 10 meq via INTRAVENOUS
  Filled 2020-01-15: qty 100

## 2020-01-15 MED ORDER — IOHEXOL 300 MG/ML  SOLN
100.0000 mL | Freq: Once | INTRAMUSCULAR | Status: AC | PRN
  Administered 2020-01-15: 100 mL via INTRAVENOUS

## 2020-01-15 MED ORDER — KCL IN DEXTROSE-NACL 40-5-0.9 MEQ/L-%-% IV SOLN: INTRAVENOUS | Status: AC

## 2020-01-15 MED ORDER — LORAZEPAM 1 MG PO TABS: 1.0000 mg | ORAL_TABLET | ORAL | Status: AC | PRN

## 2020-01-15 NOTE — ED Provider Notes (Signed)
[facility] EMERGENCY DEPARTMENT Provider Note   CSN: [id number] Arrival date & time: 01/15/20  1658     History Chief Complaint  Patient presents with  . Chest Pain    Annette Lucas is a 49 y.o. female with a history of IBS, GERD, surgical history significant for total vaginal hysterectomy presenting with a 5-day history of multiple symptoms.  She describes developing a significant generalized headache about 5 days ago, then 3 to 4 days ago started developing abdominal pain described as upper abdominal pressure along with abdominal distention which radiates into her chest.  She denies fevers or chills, no loss of taste or smell, no cough or upper URI symptoms.  She has been unable to keep any PO down x 2 days.  She endorses generalized weakness. She denies hematemesis and rectal bleeding.  Denies reduced urinary frequency.  She reports her sister who recently tested positive has similar symptoms but she has not come directly in contact with her.  She has found no alleviators for her symptoms.   Of note, pt does endorse frequent etoh use.    The history is provided by the patient.       Past Medical History:  Diagnosis Date  . Allergy to meat   . GERD (gastroesophageal reflux disease)   . IBS (irritable bowel syndrome)     There are no problems to display for this patient.   Past Surgical History:  Procedure Laterality Date  . CYSTOSCOPY  07/06/2011   Procedure: CYSTOSCOPY;  Surgeon: Clarene Duke, MD;  Location: [facility];  Service: Gynecology;  Laterality: N/A;  . ENDOMETRIAL ABLATION    . LAPAROSCOPIC SALPINGO OOPHERECTOMY Bilateral 09/01/2016   Procedure: LAPAROSCOPIC SALPINGO OOPHORECTOMY;  Surgeon: Cheri Fowler, MD;  Location: [facility];  Service: Gynecology;  Laterality: Bilateral;  . VAGINAL HYSTERECTOMY  07/06/2011   Procedure: HYSTERECTOMY VAGINAL;  Surgeon: Clarene Duke, MD;  Location: [facility];  Service: Gynecology;  Laterality: N/A;     OB History   No  obstetric history on file.     History reviewed. No pertinent family history.  Social History   Tobacco Use  . Smoking status: Never Smoker  . Smokeless tobacco: Never Used  Substance Use Topics  . Alcohol use: Yes    Comment: occ  . Drug use: No    Home Medications Prior to Admission medications   Medication Sig Start Date End Date Taking? Authorizing Provider  cetirizine (ZYRTEC) 10 MG tablet Take 10 mg by mouth daily.    [provider]  HYDROcodone-acetaminophen (NORCO) 5-325 MG tablet Take 1-2 tablets by mouth every 6 (six) hours as needed for severe pain. 09/01/16   Meisinger, Todd, MD  hyoscyamine (LEVSIN) 0.125 MG/5ML ELIX Take 0.125 mg by mouth every 4 (four) hours as needed. For IBS.     [provider]  ibuprofen (ADVIL,MOTRIN) 200 MG tablet Take 400 mg by mouth every 6 (six) hours as needed. For pain.     [provider]  ondansetron (ZOFRAN-ODT) 8 MG disintegrating tablet Take 8 mg by mouth every 8 (eight) hours as needed for nausea or vomiting.  08/06/16   [provider]  ranitidine (ZANTAC) 75 MG tablet Take 75 mg by mouth 2 (two) times daily.    [provider]    Allergies    Levofloxacin, Other, and Penicillins  Review of Systems   Review of Systems  Constitutional: Positive for fatigue. Negative for chills and fever.  HENT: Negative for congestion and  sore throat.   Eyes: Negative.   Respiratory: Negative for chest tightness and shortness of breath.   Cardiovascular: Positive for chest pain. Negative for palpitations.  Gastrointestinal: Positive for abdominal distention, abdominal pain, diarrhea, nausea and vomiting. Negative for blood in stool.  Genitourinary: Negative.  Negative for decreased urine volume and dysuria.  Musculoskeletal: Negative for arthralgias, joint swelling and neck pain.  Skin: Negative.  Negative for rash and wound.  Neurological: Negative for dizziness, weakness, light-headedness,  numbness and headaches.  Psychiatric/Behavioral: Negative.     Physical Exam Updated Vital Signs BP 122/70   Pulse (!) 106   Temp 98.2 F (36.8 C) (Oral)   Resp (!) 28   Ht 5\' 4"  (1.626 m)   Wt 70.3 kg   LMP 06/14/2011   SpO2 100%   BMI 26.61 kg/m   Physical Exam Vitals and nursing note reviewed. Exam conducted with a chaperone present.  Constitutional:      Appearance: She is well-developed. She is ill-appearing.  HENT:     Head: Normocephalic and atraumatic.     Mouth/Throat:     Mouth: Mucous membranes are dry.     Pharynx: Uvula midline. No oropharyngeal exudate.  Eyes:     Conjunctiva/sclera: Conjunctivae normal.  Cardiovascular:     Rate and Rhythm: Regular rhythm. Tachycardia present.     Heart sounds: Normal heart sounds.  Pulmonary:     Effort: Pulmonary effort is normal.     Breath sounds: Normal breath sounds. No decreased breath sounds, wheezing or rhonchi.  Abdominal:     General: Bowel sounds are decreased. There is distension.     Tenderness: There is abdominal tenderness in the epigastric area. There is guarding.     Comments: Epigastric and RUQ ttp, distention with increased tympany to percussion.   Genitourinary:    Rectum: Guaiac result positive.     Comments: Hemoccult positive. Firm indurated ridge at 12 oclock position of anal verge.  No external hemorrhoid appreciated. Musculoskeletal:        General: Normal range of motion.     Cervical back: Normal range of motion.  Skin:    General: Skin is warm and dry.  Neurological:     Mental Status: She is alert.     ED Results / Procedures / Treatments   Labs (all labs ordered are listed, but only abnormal results are displayed) Labs Reviewed  CBC WITH DIFFERENTIAL/PLATELET - Abnormal; Notable for the following components:      Result Value   WBC 12.4 (*)    RBC 2.66 (*)    Hemoglobin 10.0 (*)    HCT 30.0 (*)    MCV 112.8 (*)    MCH 37.6 (*)    RDW 19.9 (*)    Neutro Abs 9.7 (*)    All  other components within normal limits  COMPREHENSIVE METABOLIC PANEL - Abnormal; Notable for the following components:   Sodium 134 (*)    Potassium 2.3 (*)    Chloride 94 (*)    Glucose, Bld 104 (*)    Calcium 8.0 (*)    Total Protein 6.1 (*)    Albumin 2.2 (*)    AST 76 (*)    Alkaline Phosphatase 214 (*)    Total Bilirubin 2.2 (*)    All other components within normal limits  POC OCCULT BLOOD, ED - Abnormal; Notable for the following components:   Fecal Occult Bld POSITIVE (*)    All other components within normal limits  LIPASE,  BLOOD  URINALYSIS, ROUTINE W REFLEX MICROSCOPIC  POC SARS CORONAVIRUS 2 AG -  ED  POC URINE PREG, ED  TROPONIN I (HIGH SENSITIVITY)  TROPONIN I (HIGH SENSITIVITY)    EKG EKG Interpretation  Date/Time:  Tuesday January 15 2020 17:10:15 EDT Ventricular Rate:  107 PR Interval:    QRS Duration: 81 QT Interval:  385 QTC Calculation: 514 R Axis:   38 Text Interpretation: Sinus tachycardia Repol abnrm suggests ischemia, anterolateral Prolonged QT interval Baseline wander in lead(s) III aVF No old tracing to compare Confirmed by Noemi Chapel [phone]) on 01/15/2020 5:43:59 PM   Radiology CT Abdomen Pelvis W Contrast  Result Date: 01/15/2020 CLINICAL DATA:  Nausea, vomiting and diarrhea for the past 2 days. Abdominal pain. EXAM: CT ABDOMEN AND PELVIS WITH CONTRAST TECHNIQUE: Multidetector CT imaging of the abdomen and pelvis was performed using the standard protocol following bolus administration of intravenous contrast. CONTRAST:  194mL OMNIPAQUE IOHEXOL 300 MG/ML  SOLN COMPARISON:  None. FINDINGS: Lower chest: Mild linear scarring at both lung bases. Hepatobiliary: Marked diffuse low density of the liver. Normal appearing gallbladder. Pancreas: Unremarkable. No pancreatic ductal dilatation or surrounding inflammatory changes. Spleen: Normal in size without focal abnormality. Adrenals/Urinary Tract: Small amount of air in the urinary bladder, compatible with  recent catheterization. Normal appearing adrenal glands, kidneys and ureters. Stomach/Bowel: Lipomatous changes involving the ileocecal valve. Mild fat density wall thickening involving the right colon. Minimal right pericolonic soft tissue stranding. The remainder of the colon is unremarkable. Unremarkable stomach and small bowel. No evidence of appendicitis. Vascular/Lymphatic: No significant vascular findings are present. No enlarged abdominal or pelvic lymph nodes. Reproductive: Status post hysterectomy. No adnexal masses. Other: Small amount of free peritoneal fluid in the pelvis. Musculoskeletal: Mild dextroconvex lumbar scoliosis. Otherwise, unremarkable bones. IMPRESSION: 1. Mild changes of previous colitis involving the right colon with probable minimal acute right colon colitis. 2. Marked diffuse hepatic steatosis. 3. Small amount of free peritoneal fluid in the pelvis. Electronically Signed   By: Claudie Revering M.D.   On: 01/15/2020 19:37   DG Chest Portable 1 View  Result Date: 01/15/2020 CLINICAL DATA:  Chest pain EXAM: PORTABLE CHEST 1 VIEW COMPARISON:  None. FINDINGS: The heart size and mediastinal contours are within normal limits. Both lungs are clear. The visualized skeletal structures are unremarkable. IMPRESSION: No active disease. Electronically Signed   By: Prudencio Pair M.D.   On: 01/15/2020 18:13    Procedures Procedures (including critical care time)  Medications Ordered in ED Medications  potassium chloride 10 mEq in 100 mL IVPB (10 mEq Intravenous New Bag/Given 01/15/20 1938)  potassium chloride 10 mEq in 100 mL IVPB (has no administration in time range)  pantoprazole (PROTONIX) injection 40 mg (has no administration in time range)  sodium chloride flush (NS) 0.9 % injection 3 mL (3 mLs Intravenous Given 01/15/20 1759)  fentaNYL (SUBLIMAZE) injection 100 mcg (100 mcg Intravenous Given 01/15/20 1758)  metoCLOPramide (REGLAN) injection 10 mg (10 mg Intravenous Given 01/15/20 1758)    magnesium sulfate IVPB 2 g 50 mL (0 g Intravenous Stopped 01/15/20 1939)  iohexol (OMNIPAQUE) 300 MG/ML solution 100 mL (100 mLs Intravenous Contrast Given 01/15/20 1919)    ED Course  I have reviewed the triage vital signs and the nursing notes.  Pertinent labs & imaging results that were available during my care of the patient were reviewed by me and considered in my medical decision making (see chart for details).    MDM Rules/Calculators/A&P  Prolonged QT on ekg in the setting of significant hypokalemia with nausea and vomiting, diarrhea and dehydration.  Abdominal pain and distention without obstruction on CT imaging, mild colitis.  Hepatic steatosis.  Hemoccult positive with anemia.  Labs are also significant for an elevated AST and elevated bilirubin of 2.2 which is consistent with her EtOH use and hepatic steatosis seen on CT imaging. She will require admission for replacement of potassium and symptomatic relief.  She also is strongly hemoccult positive with bleeding source unclear. No hematemesis, no h/o PUD.  protonix IV given.    Discussed with Dr. Denton Brick who accepts pt for admission.   Final Clinical Impression(s) / ED Diagnoses Final diagnoses:  Gastrointestinal hemorrhage, unspecified gastrointestinal hemorrhage type  Anemia, unspecified type  Hypokalemia    Rx / DC Orders ED Discharge Orders    None       Landis Martins 01/15/20 2023    Noemi Chapel, MD 01/15/20 2107

## 2020-01-15 NOTE — ED Notes (Signed)
Pt is aware we need urine sample, will let us know when needs to go.

## 2020-01-15 NOTE — H&P (Signed)
History and Physical    Annette Lucas [id number] DOB: 01/16/1971 DOA: 01/15/2020  PCP: Rosalee Kaufman, PA-C   Patient coming from: Home  I have personally briefly reviewed patient's old medical records in [place]  Chief Complaint: Chest and abdominal pain  HPI: Annette Lucas is a 49 y.o. female with medical history significant for irritable bowel syndrome. Patient symptoms started with a headache about 5 days ago, then about 3 days ago she started having vomiting and loose stools.  Reports several episodes of both, almost every 2 hours, daily over the past 3 days.  She reports abdominal bloating and pain diffusely.  Patient also reports lower chest pain, central, described as a feeling of tightness, that radiates to the back.  It is not worse with deep breathing.  She was resting when chest pain started.  She denies personal or family history of heart attacks.  Never smoked cigarettes. No cough, no leg swelling redness or pain, no associated difficulty breathing.  She denies black stools, no blood in stools.  Vomitus did not appear black, and there was no blood. Patient reports she has not drank any alcohol in several days, she tells me she is trying to quit.  She has never had withdrawal from alcohol.  She currently denies visual or auditory hallucinations.  Tells me she used to drink 1-2 alcoholic beverages most days.  ED Course: Tachycardic to 106,  blood pressure systolic [id number], per ED provider notes down to 85.  Potassium 2.3.  Sodium 134.  WBC 12.4.  Hemoglobin 10.  Stool FOBT positive.  CT abdomen and pelvis with contrast showed mild changes of previous colitis involving the right colon with probable minimal acute right colon colitis.  Protonix, magnesium and potassium given.  Hospitalist to admit for further evaluation and management.  Review of Systems: As per HPI all other systems reviewed and negative.  Past Medical History:  Diagnosis Date  . Allergy to meat    . GERD (gastroesophageal reflux disease)   . IBS (irritable bowel syndrome)     Past Surgical History:  Procedure Laterality Date  . CYSTOSCOPY  07/06/2011   Procedure: CYSTOSCOPY;  Surgeon: Clarene Duke, MD;  Location: [facility];  Service: Gynecology;  Laterality: N/A;  . ENDOMETRIAL ABLATION    . LAPAROSCOPIC SALPINGO OOPHERECTOMY Bilateral 09/01/2016   Procedure: LAPAROSCOPIC SALPINGO OOPHORECTOMY;  Surgeon: Cheri Fowler, MD;  Location: [facility];  Service: Gynecology;  Laterality: Bilateral;  . VAGINAL HYSTERECTOMY  07/06/2011   Procedure: HYSTERECTOMY VAGINAL;  Surgeon: Clarene Duke, MD;  Location: [facility];  Service: Gynecology;  Laterality: N/A;     reports that she has never smoked. She has never used smokeless tobacco. She reports current alcohol use. She reports that she does not use drugs.  Allergies  Allergen Reactions  . Levofloxacin Anaphylaxis  . Other Anaphylaxis    Meat allergy: Beef, pork, lamb  . Penicillins Anaphylaxis    Has patient had a PCN reaction causing immediate rash, facial/tongue/throat swelling, SOB or lightheadedness with hypotension: Yes Has patient had a PCN reaction causing severe rash involving mucus membranes or skin necrosis: No Has patient had a PCN reaction that required hospitalization Yes Has patient had a PCN reaction occurring within the last 10 years: No If all of the above answers are "NO", then may proceed with Cephalosporin use.    No family history of heart attacks.  Prior to Admission medications   Medication Sig Start Date End Date Taking? Authorizing Provider  cetirizine (ZYRTEC) 10 MG tablet Take 10 mg by mouth daily.    [provider]  HYDROcodone-acetaminophen (NORCO) 5-325 MG tablet Take 1-2 tablets by mouth every 6 (six) hours as needed for severe pain. 09/01/16   Meisinger, Todd, MD  hyoscyamine (LEVSIN) 0.125 MG/5ML ELIX Take 0.125 mg by mouth every 4 (four) hours as needed. For IBS.     [provider]    ibuprofen (ADVIL,MOTRIN) 200 MG tablet Take 400 mg by mouth every 6 (six) hours as needed. For pain.     [provider]  ondansetron (ZOFRAN-ODT) 8 MG disintegrating tablet Take 8 mg by mouth every 8 (eight) hours as needed for nausea or vomiting.  08/06/16   [provider]  ranitidine (ZANTAC) 75 MG tablet Take 75 mg by mouth 2 (two) times daily.    [provider]    Physical Exam: Vitals:   01/15/20 1730 01/15/20 1830 01/15/20 1900 01/15/20 1930  BP: 101/63 113/70 111/83 122/70  Pulse: (!) 106 (!) 101 96 (!) 106  Resp:  (!) 21 14 (!) 28  Temp:      TempSrc:      SpO2: 100% 100% 99% 100%  Weight:      Height:        Constitutional: Hands moderately tremulous Vitals:   01/15/20 1730 01/15/20 1830 01/15/20 1900 01/15/20 1930  BP: 101/63 113/70 111/83 122/70  Pulse: (!) 106 (!) 101 96 (!) 106  Resp:  (!) 21 14 (!) 28  Temp:      TempSrc:      SpO2: 100% 100% 99% 100%  Weight:      Height:       Eyes: PERRL, lids and conjunctivae normal ENMT: Mucous membranes are mildly dry Neck: normal, supple, no masses, no thyromegaly Respiratory: clear to auscultation bilaterally, no wheezing, no crackles. Normal respiratory effort. No accessory muscle use.  Cardiovascular: Tachycardic, regular rate and rhythm, no murmurs / rubs / gallops. No extremity edema. 2+ pedal pulses  Abdomen: Minimal diffuse tenderness, full, no masses palpated. No hepatosplenomegaly. Bowel sounds positive.  Musculoskeletal: no clubbing / cyanosis. No joint deformity upper and lower extremities. Good ROM, no contractures. Normal muscle tone.  Skin: no rashes, lesions, ulcers. No induration Neurologic: No apparent cranial nerve abnormality, moving all extremities spontaneously. Psychiatric: Normal judgment and insight. Alert and oriented x 3. Normal mood.   Labs on Admission: I have personally reviewed following labs and imaging studies  CBC: Recent Labs  Lab 01/15/20 1722  WBC  12.4*  NEUTROABS 9.7*  HGB 10.0*  HCT 30.0*  MCV 112.8*  PLT [id number]   Basic Metabolic Panel: Recent Labs  Lab 01/15/20 1722  NA 134*  K 2.3*  CL 94*  CO2 25  GLUCOSE 104*  BUN 14  CREATININE 0.57  CALCIUM 8.0*  MG 1.5*   Liver Function Tests: Recent Labs  Lab 01/15/20 1722  AST 76*  ALT 43  ALKPHOS 214*  BILITOT 2.2*  PROT 6.1*  ALBUMIN 2.2*   Recent Labs  Lab 01/15/20 1722  LIPASE 17    Urine analysis:    Component Value Date/Time   COLORURINE AMBER (A) 01/15/2020 2028   APPEARANCEUR HAZY (A) 01/15/2020 2028   LABSPEC 1.041 (H) 01/15/2020 2028   PHURINE 6.0 01/15/2020 2028   GLUCOSEU NEGATIVE 01/15/2020 2028   HGBUR MODERATE (A) 01/15/2020 2028   BILIRUBINUR NEGATIVE 01/15/2020 2028   KETONESUR NEGATIVE 01/15/2020 2028   PROTEINUR NEGATIVE 01/15/2020 2028   NITRITE NEGATIVE 01/15/2020  2028   LEUKOCYTESUR LARGE (A) 01/15/2020 2028    Radiological Exams on Admission: CT Abdomen Pelvis W Contrast  Result Date: 01/15/2020 CLINICAL DATA:  Nausea, vomiting and diarrhea for the past 2 days. Abdominal pain. EXAM: CT ABDOMEN AND PELVIS WITH CONTRAST TECHNIQUE: Multidetector CT imaging of the abdomen and pelvis was performed using the standard protocol following bolus administration of intravenous contrast. CONTRAST:  127mL OMNIPAQUE IOHEXOL 300 MG/ML  SOLN COMPARISON:  None. FINDINGS: Lower chest: Mild linear scarring at both lung bases. Hepatobiliary: Marked diffuse low density of the liver. Normal appearing gallbladder. Pancreas: Unremarkable. No pancreatic ductal dilatation or surrounding inflammatory changes. Spleen: Normal in size without focal abnormality. Adrenals/Urinary Tract: Small amount of air in the urinary bladder, compatible with recent catheterization. Normal appearing adrenal glands, kidneys and ureters. Stomach/Bowel: Lipomatous changes involving the ileocecal valve. Mild fat density wall thickening involving the right colon. Minimal right pericolonic  soft tissue stranding. The remainder of the colon is unremarkable. Unremarkable stomach and small bowel. No evidence of appendicitis. Vascular/Lymphatic: No significant vascular findings are present. No enlarged abdominal or pelvic lymph nodes. Reproductive: Status post hysterectomy. No adnexal masses. Other: Small amount of free peritoneal fluid in the pelvis. Musculoskeletal: Mild dextroconvex lumbar scoliosis. Otherwise, unremarkable bones. IMPRESSION: 1. Mild changes of previous colitis involving the right colon with probable minimal acute right colon colitis. 2. Marked diffuse hepatic steatosis. 3. Small amount of free peritoneal fluid in the pelvis. Electronically Signed   By: Claudie Revering M.D.   On: 01/15/2020 19:37   DG Chest Portable 1 View  Result Date: 01/15/2020 CLINICAL DATA:  Chest pain EXAM: PORTABLE CHEST 1 VIEW COMPARISON:  None. FINDINGS: The heart size and mediastinal contours are within normal limits. Both lungs are clear. The visualized skeletal structures are unremarkable. IMPRESSION: No active disease. Electronically Signed   By: Prudencio Pair M.D.   On: 01/15/2020 18:13    EKG: Independently reviewed.  Sinus rhythm, QTC prolonged at 514.  No significant ST or T wave abnormalities.  No prior EKG to compare.  Assessment/Plan Principal Problem:   Hypokalemia Active Problems:   Anemia    Electrolyte abnormality - hypokalemia, potassium 2.3, likely from GI blood loss vomiting and diarrhea likely poor p.o. intake.  Also hypomagnesemia-1.5. Both abnormalities likely secondary to chronic alcohol use. -Replete K IV and p.o. -BMP, mag a.m.  Colitis-vomiting, loose stools, abdominal bloating/pain. Abdominal CT shows mild changes of previous colitis, with probable minimal acute right colitis.  No vomiting no loose stools today. -Phenergan as needed -IV fluids  D5 N/s + 40 KCL 100cc/hr x 1 day -BMP, CBC a.m. -As GI symptoms appear to be resolving, and considering resolving/probable  minimal CT findings, will hold off on antibiotics at this time, she also has penicillin allergies-anaphylaxis and QT prolongation, significantly limiting antibiotic options. -Bowel rest with clear liquid diet -Stool C. difficile  Alcohol abuse and likely withdrawal-blood alcohol level < 10, tachycardic, leukocytosis-both of which may be due to dehydration and stress reaction respectively.  She is also tremulous.  Reports last drink was several days ago, previously drank 1-2 alcoholic beverages beer/wine most days. -Ativan 1 mg x 1, continue CIWA as needed, scheduled -Thiamine folate multivitamins -Check Phosphorus  Macrocytic anemia-likely acute, hemoglobin 10, last check 3 years ago was 14.8.  Denies melena hematemesis or hematochezia, but stool FOBT positive.  Reports occasional NSAID use 3-4 times a month.  Lower chest pain likely of GI etiology.  Reports EGD in the past for reflux. -IV  Protonix 40 daily -CBC a.m. -Clear liquid diet, n.p.o. midnight. -GI consult -Iron studies in a.m, B12, folate considering alcohol use  Prolonged QTC-due to severe hypokalemia, and hypomagnesemia. -Avoid further QT prolonging medications -EKG a.m.  Chest pain-low risk for ACS, EKG and troponin without significant abnormality. Hs troponin 4 x 2.  No family history of premature coronary artery disease.  Never smoked cigarettes. - UDS -IV Protonix daily.  Elevated liver enzymes-AST 76, ALT 43, ALP 214, total bilirubin 2.2.  Abdominal CT shows marked diffuse hepatic steatosis,.  Normal-appearing gallbladder. -CMP a.m.  DVT prophylaxis: SCDs Code Status: Full code Family Communication: Mother at bedside Disposition Plan: ~ 2 days, Pending correction of electrolytes, improvement in GI symptoms, and GI evaluation. Consults called: Gastroenterology Admission status: Observation, telemetry.   Bethena Roys MD [facility]  01/15/2020, 10:10 PM

## 2020-01-15 NOTE — ED Provider Notes (Signed)
49 year old female, she is a known alcoholic, states she is trying to drink less but still drinks frequently.  She presents with some nausea vomiting and diarrhea, diffuse generalized weakness and on exam has some abdominal discomfort though no significant tenderness to percussion, minimal discomfort to palpation, she is tachycardic, she is borderline hypotensive with blood pressures ranging between 85 and [id number] systolic.  She has a severe hypokalemia with findings of QT prolongation on EKG.  She will be given magnesium, potassium, fluid hydration and she will need a CT scan to rule out any other intra-abdominal pathology.  At this time the patient is critically ill with severe hypokalemia and will need to be admitted to the hospital on cardiac monitoring.  This will be begun in the emergency department  Procedures  CRITICAL CARE Performed by: Johnna Acosta Total critical care time: 35 minutes Critical care time was exclusive of separately billable procedures and treating other patients. Critical care was necessary to treat or prevent imminent or life-threatening deterioration. Critical care was time spent personally by me on the following activities: development of treatment plan with patient and/or surrogate as well as nursing, discussions with consultants, evaluation of patient's response to treatment, examination of patient, obtaining history from patient or surrogate, ordering and performing treatments and interventions, ordering and review of laboratory studies, ordering and review of radiographic studies, pulse oximetry and re-evaluation of patient's condition.   Medical screening examination/treatment/procedure(s) were conducted as a shared visit with non-physician practitioner(s) and myself.  I personally evaluated the patient during the encounter.  Clinical Impression:   Final diagnoses:  Gastrointestinal hemorrhage, unspecified gastrointestinal hemorrhage type  Anemia, unspecified type   Hypokalemia         Noemi Chapel, MD 01/15/20 2107

## 2020-01-15 NOTE — ED Triage Notes (Signed)
Pt states she started having chest pain x 2 days; pt states she has had n/v/d x 2 days with a headache that started x 5 days ago; pt states the chest pain is a pressure that radiates around to her back; pt also c/o abdominal pain

## 2020-01-16 ENCOUNTER — Observation Stay (HOSPITAL_COMMUNITY): Payer: Medicaid Other

## 2020-01-16 DIAGNOSIS — R195 Other fecal abnormalities: Secondary | ICD-10-CM | POA: Diagnosis not present

## 2020-01-16 DIAGNOSIS — K2920 Alcoholic gastritis without bleeding: Secondary | ICD-10-CM | POA: Diagnosis present

## 2020-01-16 DIAGNOSIS — R079 Chest pain, unspecified: Secondary | ICD-10-CM

## 2020-01-16 DIAGNOSIS — G621 Alcoholic polyneuropathy: Secondary | ICD-10-CM | POA: Diagnosis present

## 2020-01-16 DIAGNOSIS — N10 Acute tubulo-interstitial nephritis: Secondary | ICD-10-CM | POA: Diagnosis not present

## 2020-01-16 DIAGNOSIS — E876 Hypokalemia: Secondary | ICD-10-CM | POA: Diagnosis not present

## 2020-01-16 DIAGNOSIS — R0789 Other chest pain: Secondary | ICD-10-CM | POA: Diagnosis present

## 2020-01-16 DIAGNOSIS — K529 Noninfective gastroenteritis and colitis, unspecified: Secondary | ICD-10-CM

## 2020-01-16 DIAGNOSIS — K7010 Alcoholic hepatitis without ascites: Secondary | ICD-10-CM | POA: Diagnosis present

## 2020-01-16 DIAGNOSIS — D539 Nutritional anemia, unspecified: Secondary | ICD-10-CM | POA: Diagnosis present

## 2020-01-16 DIAGNOSIS — Z881 Allergy status to other antibiotic agents status: Secondary | ICD-10-CM | POA: Diagnosis not present

## 2020-01-16 DIAGNOSIS — N12 Tubulo-interstitial nephritis, not specified as acute or chronic: Secondary | ICD-10-CM | POA: Diagnosis present

## 2020-01-16 DIAGNOSIS — R933 Abnormal findings on diagnostic imaging of other parts of digestive tract: Secondary | ICD-10-CM | POA: Diagnosis not present

## 2020-01-16 DIAGNOSIS — Z880 Allergy status to penicillin: Secondary | ICD-10-CM | POA: Diagnosis not present

## 2020-01-16 DIAGNOSIS — Z20822 Contact with and (suspected) exposure to covid-19: Secondary | ICD-10-CM | POA: Diagnosis present

## 2020-01-16 DIAGNOSIS — D649 Anemia, unspecified: Secondary | ICD-10-CM | POA: Diagnosis not present

## 2020-01-16 DIAGNOSIS — R945 Abnormal results of liver function studies: Secondary | ICD-10-CM | POA: Diagnosis not present

## 2020-01-16 DIAGNOSIS — F1010 Alcohol abuse, uncomplicated: Secondary | ICD-10-CM | POA: Diagnosis present

## 2020-01-16 LAB — URINALYSIS, COMPLETE (UACMP) WITH MICROSCOPIC
Bilirubin Urine: NEGATIVE
Glucose, UA: NEGATIVE mg/dL
Hgb urine dipstick: NEGATIVE
Ketones, ur: NEGATIVE mg/dL
Nitrite: NEGATIVE
Protein, ur: NEGATIVE mg/dL
Specific Gravity, Urine: 1.031 — ABNORMAL HIGH (ref 1.005–1.030)
pH: 5 (ref 5.0–8.0)

## 2020-01-16 LAB — COMPREHENSIVE METABOLIC PANEL
ALT: 34 U/L (ref 0–44)
AST: 52 U/L — ABNORMAL HIGH (ref 15–41)
Albumin: 1.9 g/dL — ABNORMAL LOW (ref 3.5–5.0)
Alkaline Phosphatase: 177 U/L — ABNORMAL HIGH (ref 38–126)
Anion gap: 10 (ref 5–15)
BUN: 11 mg/dL (ref 6–20)
CO2: 27 mmol/L (ref 22–32)
Calcium: 7.8 mg/dL — ABNORMAL LOW (ref 8.9–10.3)
Chloride: 98 mmol/L (ref 98–111)
Creatinine, Ser: 0.49 mg/dL (ref 0.44–1.00)
GFR calc Af Amer: >60 mL/min (ref >60)
GFR calc non Af Amer: >60 mL/min (ref >60)
Glucose, Bld: 117 mg/dL — ABNORMAL HIGH (ref 70–99)
Potassium: 2.8 mmol/L — ABNORMAL LOW (ref 3.5–5.1)
Sodium: 135 mmol/L (ref 135–145)
Total Bilirubin: 1.4 mg/dL — ABNORMAL HIGH (ref 0.3–1.2)
Total Protein: 5.3 g/dL — ABNORMAL LOW (ref 6.5–8.1)

## 2020-01-16 LAB — TROPONIN I (HIGH SENSITIVITY)
Troponin I (High Sensitivity): 4 ng/L (ref <18)
Troponin I (High Sensitivity): 3 ng/L (ref <18)

## 2020-01-16 LAB — ECHOCARDIOGRAM COMPLETE
Height: 64 in
Weight: 2310.42 oz

## 2020-01-16 LAB — IRON AND TIBC
Iron: 114 ug/dL (ref 28–170)
Saturation Ratios: 92 % — ABNORMAL HIGH (ref 10.4–31.8)
TIBC: 124 ug/dL — ABNORMAL LOW (ref 250–450)
UIBC: 10 ug/dL

## 2020-01-16 LAB — SARS CORONAVIRUS 2 (TAT 6-24 HRS): SARS Coronavirus 2: NEGATIVE

## 2020-01-16 LAB — CBC
HCT: 26.8 % — ABNORMAL LOW (ref 36.0–46.0)
Hemoglobin: 8.7 g/dL — ABNORMAL LOW (ref 12.0–15.0)
MCH: 37.7 pg — ABNORMAL HIGH (ref 26.0–34.0)
MCHC: 32.5 g/dL (ref 30.0–36.0)
MCV: 116 fL — ABNORMAL HIGH (ref 80.0–100.0)
Platelets: 235 10*3/uL (ref 150–400)
RBC: 2.31 MIL/uL — ABNORMAL LOW (ref 3.87–5.11)
RDW: 20.4 % — ABNORMAL HIGH (ref 11.5–15.5)
WBC: 8 10*3/uL (ref 4.0–10.5)
nRBC: 0 % (ref 0.0–0.2)

## 2020-01-16 LAB — HIV ANTIBODY (ROUTINE TESTING W REFLEX): HIV Screen 4th Generation wRfx: NONREACTIVE

## 2020-01-16 LAB — HEPATITIS B SURFACE ANTIGEN: Hepatitis B Surface Ag: NONREACTIVE

## 2020-01-16 LAB — RAPID URINE DRUG SCREEN, HOSP PERFORMED
Amphetamines: NOT DETECTED
Barbiturates: NOT DETECTED
Benzodiazepines: NOT DETECTED
Cocaine: NOT DETECTED
Opiates: NOT DETECTED
Tetrahydrocannabinol: NOT DETECTED

## 2020-01-16 LAB — HEPATITIS C ANTIBODY: HCV Ab: NONREACTIVE

## 2020-01-16 LAB — T4, FREE: Free T4: 1.26 ng/dL — ABNORMAL HIGH (ref 0.61–1.12)

## 2020-01-16 LAB — MAGNESIUM: Magnesium: 2.3 mg/dL (ref 1.7–2.4)

## 2020-01-16 LAB — FOLATE: Folate: 3.3 ng/mL — ABNORMAL LOW (ref >5.9)

## 2020-01-16 LAB — TSH: TSH: 2.944 u[IU]/mL (ref 0.350–4.500)

## 2020-01-16 LAB — VITAMIN B12: Vitamin B-12: 226 pg/mL (ref 180–914)

## 2020-01-16 MED ORDER — CYANOCOBALAMIN 1000 MCG/ML IJ SOLN
1000.0000 ug | Freq: Once | INTRAMUSCULAR | Status: AC
  Administered 2020-01-16: 1000 ug via INTRAMUSCULAR
  Filled 2020-01-16: qty 1

## 2020-01-16 MED ORDER — SODIUM CHLORIDE 0.9 % IV SOLN
2.0000 g | Freq: Three times a day (TID) | INTRAVENOUS | Status: DC
  Administered 2020-01-16 – 2020-01-19 (×8): 2 g via INTRAVENOUS
  Filled 2020-01-16 (×17): qty 2

## 2020-01-16 MED ORDER — PANTOPRAZOLE SODIUM 40 MG IV SOLR
40.0000 mg | Freq: Two times a day (BID) | INTRAVENOUS | Status: DC
  Administered 2020-01-16 – 2020-01-18 (×5): 40 mg via INTRAVENOUS
  Filled 2020-01-16 (×5): qty 40

## 2020-01-16 MED ORDER — POTASSIUM CHLORIDE 10 MEQ/100ML IV SOLN
10.0000 meq | INTRAVENOUS | Status: AC
  Administered 2020-01-16 (×3): 10 meq via INTRAVENOUS
  Filled 2020-01-16 (×3): qty 100

## 2020-01-16 MED ORDER — VITAMIN B-12 1000 MCG PO TABS
500.0000 ug | ORAL_TABLET | Freq: Every day | ORAL | Status: DC
  Administered 2020-01-17: 500 ug via ORAL
  Filled 2020-01-16: qty 5

## 2020-01-16 MED ORDER — VANCOMYCIN HCL 750 MG/150ML IV SOLN
750.0000 mg | Freq: Two times a day (BID) | INTRAVENOUS | Status: DC
  Administered 2020-01-16 – 2020-01-19 (×6): 750 mg via INTRAVENOUS
  Filled 2020-01-16 (×6): qty 150

## 2020-01-16 NOTE — Progress Notes (Addendum)
Pharmacy Antibiotic Note  Annette Lucas is a 49 y.o. female admitted on 01/15/2020 with colitis.  Pharmacy has been consulted for vanc dosing.  Pt was admitted for abd pain. She has a hx of IBS. GI was consulted and she is being r/o for colitis/c.diff. Vanc/aztreonam were ordered due to her allergy hx. She reported anaphylaxis to PCN and levofloxacin.   Scr 0.49 CrCl~75 Wbc wnl Vanc trough 10-15  Plan: Vanc 750mg  IV q12 Trough if needed  Height: 5\' 4"  (162.6 cm) Weight: 65.5 kg (144 lb 6.4 oz) IBW/kg (Calculated) : 54.7  Temp (24hrs), Avg:98.2 F (36.8 C), Min:97.2 F (36.2 C), Max:98.7 F (37.1 C)  Recent Labs  Lab 01/15/20 1722 01/16/20 0418  WBC 12.4* 8.0  CREATININE 0.57 0.49    Estimated Creatinine Clearance: 75.1 mL/min (by C-G formula based on SCr of 0.49 mg/dL).    Allergies  Allergen Reactions  . Levofloxacin Anaphylaxis  . Other Anaphylaxis    Meat allergy: Beef, pork, lamb  . Penicillins Anaphylaxis    Has patient had a PCN reaction causing immediate rash, facial/tongue/throat swelling, SOB or lightheadedness with hypotension: Yes Has patient had a PCN reaction causing severe rash involving mucus membranes or skin necrosis: No Has patient had a PCN reaction that required hospitalization Yes Has patient had a PCN reaction occurring within the last 10 years: No If all of the above answers are "NO", then may proceed with Cephalosporin use.     Antimicrobials this admission: 4/21 vanc>> 4/21 aztreonam>>  Dose adjustments this admission:   Microbiology results: 4/21 urine>> 4/21 GI  Panel>> 4/21 c.diff>>  Onnie Boer, PharmD, [place], AAHIVP, CPP Infectious Disease Pharmacist 01/16/2020 6:38 PM

## 2020-01-16 NOTE — Progress Notes (Signed)
PROGRESS NOTE  Annette Lucas [id number] DOB: Nov 14, 1970 DOA: 01/15/2020 PCP: Rosalee Kaufman, PA-C  Brief History:  49 year old female with a history of alcohol abuse, irritable bowel syndrome presenting with headache, nausea, vomiting, and loose stools that began on 01/13/2020. The patient had denied any fevers, chills, hematochezia, melena, dysuria, hematuria. She did have some diffuse abdominal pain. After she began vomiting she developed chest pain that was substernal in nature. She denied any shortness of breath, coughing, hemoptysis. The patient has also been complaining of generalized weakness with tingling in her bilateral hands. She denies any focal extremity weakness, dysarthria, dysphagia, focal extremity weakness, facial droop. She continues to drink approximately 2 glasses of wine daily. She has been drinking daily for the past 5 years. She denies any other alcohol or illicit drug use. Since admission, the patient states that her emesis and loose stools have improved as well as her abdominal pain. But she continues to have generalized weakness and anorexia. CT of the abdomen and pelvis showed mild fat density wall thickening of the right colon with a minimal pericolonic soft tissue stranding. There was marked hepatic steatosis with a small amount of free peritoneal fluid. GI was consulted to assist with management.  Assessment/Plan: Pyelonephritis -UA>50 WBC -obtain urine culture -start vanc/aztreonam due to multiple abx allergies -This has contributed to the patient's nausea and vomiting  Colonic wall thickening/colitis/FOBT positive -GI consult -01/15/2020 CT abdomen/pelvis as discussed above -Start vanc/aztreo and metronidazole -Clear liquid diet -Continue IV fluids  Alcohol abuse -This is also contributed to her vomiting. -Suspect the patient has a degree of alcoholic gastritis -Alcohol withdrawal protocol -Start Protonix  Transaminasemia -hep B surface  antigen -hep C antibody -Likely due to alcohol and hepatic steatosis -CT as discussed above  Atypical chest pain -Partly reproducible -Cycle troponins -Personally reviewed EKG--sinus rhythm, nonspecific ST-T wave changes -Echo  Hypomagnesemia/hypokalemia -Repleted  Alcohol Polyneuropathy  -B12--226-->replete -check folate      Disposition Plan: Patient From: Home/SNF D/C Place: Home/SNF - 2-3  Days Barriers: Not Clinically Stable--GI work up; cannot tolerate diet  Family Communication:   No Family at bedside  Consultants:  GI  Code Status:  FULL  DVT Prophylaxis:  SCDs   Procedures: As Listed in Progress Note Above  Antibiotics: vanco 4/21>>> Aztreonam 4/21>>> Metronidazole 4/21>>>     Subjective: Pt  Complains of chest pain and nausea.  Vomiting and abd pain are improving.  No further diarrhea.  No hematochezia or melena.   No sob or hemoptysis.    Objective: Vitals:   01/15/20 1930 01/15/20 2140 01/16/20 0201 01/16/20 0639  BP: 122/70 92/71 98/61  115/76  Pulse: (!) 106 98 93 86  Resp: (!) 28 20 16 16   Temp:  98.6 F (37 C) 98.7 F (37.1 C) 98 F (36.7 C)  TempSrc:  Oral Oral Oral  SpO2: 100% 100% 100% 100%  Weight:  65.5 kg    Height:  5\' 4"  (1.626 m)      Intake/Output Summary (Last 24 hours) at 01/16/2020 [id number] Last data filed at 01/16/2020 0300 Gross per 24 hour  Intake 616.4 ml  Output --  Net 616.4 ml   Weight change:  Exam:   General:  Pt is alert, follows commands appropriately, not in acute distress  HEENT: No icterus, No thrush, No neck mass, [place]/AT  Cardiovascular: RRR, S1/S2, no rubs, no gallops  Respiratory: bibasilar crackles.  Abdomen: Soft/+BS, non tender, non distended, no guarding  Extremities: No edema, No lymphangitis, No petechiae, No rashes, no synovitis   Data Reviewed: I have personally reviewed following labs and imaging studies Basic Metabolic Panel: Recent Labs  Lab 01/15/20 1722 01/16/20 0418   NA 134* 135  K 2.3* 2.8*  CL 94* 98  CO2 25 27  GLUCOSE 104* 117*  BUN 14 11  CREATININE 0.57 0.49  CALCIUM 8.0* 7.8*  MG 1.5* 2.3  PHOS 1.4*  --    Liver Function Tests: Recent Labs  Lab 01/15/20 1722 01/16/20 0418  AST 76* 52*  ALT 43 34  ALKPHOS 214* 177*  BILITOT 2.2* 1.4*  PROT 6.1* 5.3*  ALBUMIN 2.2* 1.9*   Recent Labs  Lab 01/15/20 1722  LIPASE 17   No results for input(s): AMMONIA in the last 168 hours. Coagulation Profile: No results for input(s): INR, PROTIME in the last 168 hours. CBC: Recent Labs  Lab 01/15/20 1722 01/16/20 0418  WBC 12.4* 8.0  NEUTROABS 9.7*  --   HGB 10.0* 8.7*  HCT 30.0* 26.8*  MCV 112.8* 116.0*  PLT 282 235   Cardiac Enzymes: No results for input(s): CKTOTAL, CKMB, CKMBINDEX, TROPONINI in the last 168 hours. BNP: Invalid input(s): POCBNP CBG: No results for input(s): GLUCAP in the last 168 hours. HbA1C: No results for input(s): HGBA1C in the last 72 hours. Urine analysis:    Component Value Date/Time   COLORURINE AMBER (A) 01/15/2020 2028   APPEARANCEUR HAZY (A) 01/15/2020 2028   LABSPEC 1.041 (H) 01/15/2020 2028   PHURINE 6.0 01/15/2020 2028   GLUCOSEU NEGATIVE 01/15/2020 2028   HGBUR MODERATE (A) 01/15/2020 2028   BILIRUBINUR NEGATIVE 01/15/2020 2028   KETONESUR NEGATIVE 01/15/2020 2028   PROTEINUR NEGATIVE 01/15/2020 2028   NITRITE NEGATIVE 01/15/2020 2028   LEUKOCYTESUR LARGE (A) 01/15/2020 2028   Sepsis Labs: @LABRCNTIP (procalcitonin:4,lacticidven:4) )No results found for this or any previous visit (from the past 240 hour(s)).   Scheduled Meds: . folic acid  1 mg Oral Daily  . LORazepam  0-4 mg Intravenous Q6H   Followed by  . [START ON 01/17/2020] LORazepam  0-4 mg Intravenous Q12H  . multivitamin with minerals  1 tablet Oral Daily  . pantoprazole (PROTONIX) IV  40 mg Intravenous Q12H  . thiamine  100 mg Oral Daily   Or  . thiamine  100 mg Intravenous Daily   Continuous Infusions: . dextrose 5 %  and 0.9 % NaCl with KCl 40 mEq/L 100 mL/hr at 01/15/20 2312  . potassium chloride      Procedures/Studies: CT Abdomen Pelvis W Contrast  Result Date: 01/15/2020 CLINICAL DATA:  Nausea, vomiting and diarrhea for the past 2 days. Abdominal pain. EXAM: CT ABDOMEN AND PELVIS WITH CONTRAST TECHNIQUE: Multidetector CT imaging of the abdomen and pelvis was performed using the standard protocol following bolus administration of intravenous contrast. CONTRAST:  110mL OMNIPAQUE IOHEXOL 300 MG/ML  SOLN COMPARISON:  None. FINDINGS: Lower chest: Mild linear scarring at both lung bases. Hepatobiliary: Marked diffuse low density of the liver. Normal appearing gallbladder. Pancreas: Unremarkable. No pancreatic ductal dilatation or surrounding inflammatory changes. Spleen: Normal in size without focal abnormality. Adrenals/Urinary Tract: Small amount of air in the urinary bladder, compatible with recent catheterization. Normal appearing adrenal glands, kidneys and ureters. Stomach/Bowel: Lipomatous changes involving the ileocecal valve. Mild fat density wall thickening involving the right colon. Minimal right pericolonic soft tissue stranding. The remainder of the colon is unremarkable. Unremarkable stomach and small bowel. No evidence of appendicitis. Vascular/Lymphatic: No significant vascular findings are present.  No enlarged abdominal or pelvic lymph nodes. Reproductive: Status post hysterectomy. No adnexal masses. Other: Small amount of free peritoneal fluid in the pelvis. Musculoskeletal: Mild dextroconvex lumbar scoliosis. Otherwise, unremarkable bones. IMPRESSION: 1. Mild changes of previous colitis involving the right colon with probable minimal acute right colon colitis. 2. Marked diffuse hepatic steatosis. 3. Small amount of free peritoneal fluid in the pelvis. Electronically Signed   By: Claudie Revering M.D.   On: 01/15/2020 19:37   DG Chest Portable 1 View  Result Date: 01/15/2020 CLINICAL DATA:  Chest pain  EXAM: PORTABLE CHEST 1 VIEW COMPARISON:  None. FINDINGS: The heart size and mediastinal contours are within normal limits. Both lungs are clear. The visualized skeletal structures are unremarkable. IMPRESSION: No active disease. Electronically Signed   By: Prudencio Pair M.D.   On: 01/15/2020 18:13    Orson Eva, DO  [facility]  If 7PM-7AM, please contact night-coverage [email or web address] Password [email or web address] 01/16/2020, 9:24 AM   LOS: 0 days

## 2020-01-16 NOTE — Progress Notes (Signed)
*  PRELIMINARY RESULTS* Echocardiogram 2D Echocardiogram has been performed.  Annette Lucas 01/16/2020, 11:07 AM

## 2020-01-16 NOTE — Consult Note (Addendum)
Referring Provider: Orson Eva, DO Primary Care Physician:  Rosine Door Primary Gastroenterologist:  Dr. Laural Golden  Reason for Consultation:    Nausea vomiting diarrhea and colonic wall thickening/colitis.  HPI:   Patient is 49 year old Caucasian female who presented to emergency room yesterday afternoon with 2-day history of chest pain and abdominal pain.  Patient states she was in usual state of health until 4 days prior to admission when she developed nausea vomiting and diarrhea.  She had multiple nonbloody stools.  She also experienced abdominal pain.  She did not experience fever or chills.  Evaluation in emergency room revealed patient to be mildly tachycardic and with severe hypokalemia with potassium of 2.3.  Her hemoglobin was 10 g and stool was guaiac positive.  Her alcohol level was less than 10.  She was noted to be tremulous and Dr. Denton Brick felt that she may be experiencing alcohol withdrawal.  Her chest pain was felt to be noncardiac.  Patient was admitted and begun on IV fluids.  Her magnesium level was also low.  She has been receiving IV fluids as well as IV KCl and magnesium sulfate.  Stool studies for C. difficile and GI pathogen panel were requested and are pending.  Patient has been receiving IV lorazepam. This afternoon patient says she is feeling better she denies chest pain nausea or vomiting.  She says abdominal pain is resolved.  She states she had 1 loose stool today.  She has been tolerating clear liquids. She also had echocardiogram this afternoon revealing trivial TR but no other abnormality noted.  Patient says she drinks city water or bottled water.  No recent history of antibiotic use. She is married and has a daughter age 56.  She has been working for [employer] for the last 20 years.  She does not smoke cigarettes but states she drinks wine intermittently.  She tells me the last time she had any alcohol was 1 week ago.  She denies drinking  alcohol every day. Both parents are in good health.  She has a brother also in good health.   Past Medical History:  Diagnosis Date  . Allergy to meat   . GERD (gastroesophageal reflux disease)   . IBS (irritable bowel syndrome)     Past Surgical History:  Procedure Laterality Date  . CYSTOSCOPY  07/06/2011   Procedure: CYSTOSCOPY;  Surgeon: Clarene Duke, MD;  Location: [facility];  Service: Gynecology;  Laterality: N/A;  . ENDOMETRIAL ABLATION    . LAPAROSCOPIC SALPINGO OOPHERECTOMY Bilateral 09/01/2016   Procedure: LAPAROSCOPIC SALPINGO OOPHORECTOMY;  Surgeon: Cheri Fowler, MD;  Location: [facility];  Service: Gynecology;  Laterality: Bilateral;  . VAGINAL HYSTERECTOMY  07/06/2011   Procedure: HYSTERECTOMY VAGINAL;  Surgeon: Clarene Duke, MD;  Location: [facility];  Service: Gynecology;  Laterality: N/A;    Prior to Admission medications   Medication Sig Start Date End Date Taking? Authorizing Provider  cetirizine (ZYRTEC) 10 MG tablet Take 10 mg by mouth daily as needed.    Yes [provider]  hyoscyamine (LEVSIN) 0.125 MG/5ML ELIX Take 0.125 mg by mouth every 4 (four) hours as needed. For IBS.    Yes [provider]  ibuprofen (ADVIL,MOTRIN) 200 MG tablet Take 400 mg by mouth every 6 (six) hours as needed. For pain.    Yes [provider]  ondansetron (ZOFRAN-ODT) 8 MG disintegrating tablet Take 8 mg by mouth every 8 (eight) hours as needed for nausea or vomiting.  08/06/16  Yes [provider]  HYDROcodone-acetaminophen (NORCO) 5-325 MG tablet Take 1-2 tablets by mouth every 6 (six) hours as needed for severe pain. Patient not taking: Reported on 01/16/2020 09/01/16   Cheri Fowler, MD    Current Facility-Administered Medications  Medication Dose Route Frequency Provider Last Rate Last Admin  . acetaminophen (TYLENOL) tablet 650 mg  650 mg Oral Q6H PRN Emokpae, Ejiroghene E, MD   650 mg at 01/16/20 0901   Or  . acetaminophen (TYLENOL)  suppository 650 mg  650 mg Rectal Q6H PRN Emokpae, Ejiroghene E, MD      . dextrose 5 % and 0.9 % NaCl with KCl 40 mEq/L infusion   Intravenous Continuous Emokpae, Ejiroghene E, MD 100 mL/hr at 01/16/20 1052 New Bag at 01/16/20 1052  . folic acid (FOLVITE) tablet 1 mg  1 mg Oral Daily Emokpae, Ejiroghene E, MD   1 mg at 01/16/20 1030  . LORazepam (ATIVAN) injection 0-4 mg  0-4 mg Intravenous Q6H Emokpae, Ejiroghene E, MD   2 mg at 01/16/20 1030   Followed by  . [START ON 01/17/2020] LORazepam (ATIVAN) injection 0-4 mg  0-4 mg Intravenous Q12H Emokpae, Ejiroghene E, MD      . LORazepam (ATIVAN) tablet 1-4 mg  1-4 mg Oral Q1H PRN Emokpae, Ejiroghene E, MD       Or  . LORazepam (ATIVAN) injection 1-4 mg  1-4 mg Intravenous Q1H PRN Emokpae, Ejiroghene E, MD   2 mg at 01/16/20 1312  . multivitamin with minerals tablet 1 tablet  1 tablet Oral Daily Emokpae, Ejiroghene E, MD   1 tablet at 01/16/20 1030  . ondansetron (ZOFRAN) tablet 4 mg  4 mg Oral Q6H PRN Emokpae, Ejiroghene E, MD       Or  . ondansetron (ZOFRAN) injection 4 mg  4 mg Intravenous Q6H PRN Emokpae, Ejiroghene E, MD      . pantoprazole (PROTONIX) injection 40 mg  40 mg Intravenous Therisa Doyne, MD   40 mg at 01/16/20 1030  . thiamine tablet 100 mg  100 mg Oral Daily Emokpae, Ejiroghene E, MD   100 mg at 01/16/20 1030   Or  . thiamine (B-1) injection 100 mg  100 mg Intravenous Daily Emokpae, Ejiroghene E, MD      . Derrill Memo ON 01/17/2020] vitamin B-12 (CYANOCOBALAMIN) tablet 500 mcg  500 mcg Oral Daily Tat, David, MD        Allergies as of 01/15/2020 - Review Complete 01/15/2020  Allergen Reaction Noted  . Levofloxacin Anaphylaxis 06/22/2011  . Other Anaphylaxis 08/23/2016  . Penicillins Anaphylaxis 06/22/2011    History reviewed. No pertinent family history.  Social History   Socioeconomic History  . Marital status: Married    Spouse name: Not on file  . Number of children: Not on file  . Years of education: Not on file  .  Highest education level: Not on file  Occupational History  . Not on file  Tobacco Use  . Smoking status: Never Smoker  . Smokeless tobacco: Never Used  Substance and Sexual Activity  . Alcohol use: Yes    Comment: occ  . Drug use: No  . Sexual activity: Not on file  Other Topics Concern  . Not on file  Social History Narrative  . Not on file   Social Determinants of Health   Financial Resource Strain:   . Difficulty of Paying Living Expenses:   Food Insecurity:   . Worried About Charity fundraiser in the  Last Year:   . [place] in the Last Year:   Transportation Needs:   . Film/video editor (Medical):   Marland Kitchen Lack of Transportation (Non-Medical):   Physical Activity:   . Days of Exercise per Week:   . Minutes of Exercise per Session:   Stress:   . Feeling of Stress :   Social Connections:   . Frequency of Communication with Friends and Family:   . Frequency of Social Gatherings with Friends and Family:   . Attends Religious Services:   . Active Member of Clubs or Organizations:   . Attends Archivist Meetings:   Marland Kitchen Marital Status:   Intimate Partner Violence:   . Fear of Current or Ex-Partner:   . Emotionally Abused:   Marland Kitchen Physically Abused:   . Sexually Abused:     Review of Systems: See HPI, otherwise normal ROS  Physical Exam: Temp:  [97.2 F (36.2 C)-98.7 F (37.1 C)] 97.2 F (36.2 C) (04/21 1500) Pulse Rate:  [86-106] 91 (04/21 1500) Resp:  [14-28] 18 (04/21 1500) BP: (92-125)/(61-83) 106/65 (04/21 1500) SpO2:  [99 %-100 %] 100 % (04/21 1500) Weight:  [65.5 kg] 65.5 kg (04/20 2140) Last BM Date: 01/16/20  Patient was sound sleep when I walked in the room. I was able to wake her up with help from nursing staff. She answered questions appropriately. She is not tremulous. Conjunctivae is pale.  Sclerae nonicteric. Oropharyngeal mucosa is normal.  Dentition in satisfactory condition. Cardiac exam with regular rhythm normal S1 and S2.   No murmur gallop noted. Auscultation lungs reveal vesicular breath sounds bilaterally.  No rales or rhonchi noted. Abdomen is full.  Bowel sounds are normal.  On palpation abdomen is soft and nontender.  Liver edge is interesting below RCM. She does not have peripheral edema or clubbing.  Lab Results: Recent Labs    01/15/20 1722 01/16/20 0418  WBC 12.4* 8.0  HGB 10.0* 8.7*  HCT 30.0* 26.8*  PLT 282 235   BMET Recent Labs    01/15/20 1722 01/16/20 0418  NA 134* 135  K 2.3* 2.8*  CL 94* 98  CO2 25 27  GLUCOSE 104* 117*  BUN 14 11  CREATININE 0.57 0.49  CALCIUM 8.0* 7.8*   LFT Recent Labs    01/16/20 0418  PROT 5.3*  ALBUMIN 1.9*  AST 52*  ALT 34  ALKPHOS 177*  BILITOT 1.4*   PT/INR No results for input(s): LABPROT, INR in the last 72 hours. Hepatitis Panel Recent Labs    01/16/20 1016  HEPBSAG NON REACTIVE    Studies/Results: EXAM: CT ABDOMEN AND PELVIS WITH CONTRAST  TECHNIQUE: Multidetector CT imaging of the abdomen and pelvis was performed using the standard protocol following bolus administration of intravenous contrast.  CONTRAST:  1101mL OMNIPAQUE IOHEXOL 300 MG/ML  SOLN  COMPARISON:  None.  FINDINGS: Lower chest: Mild linear scarring at both lung bases.  Hepatobiliary: Marked diffuse low density of the liver. Normal appearing gallbladder.  Pancreas: Unremarkable. No pancreatic ductal dilatation or surrounding inflammatory changes.  Spleen: Normal in size without focal abnormality.  Adrenals/Urinary Tract: Small amount of air in the urinary bladder, compatible with recent catheterization. Normal appearing adrenal glands, kidneys and ureters.  Stomach/Bowel: Lipomatous changes involving the ileocecal valve. Mild fat density wall thickening involving the right colon. Minimal right pericolonic soft tissue stranding. The remainder of the colon is unremarkable. Unremarkable stomach and small bowel. No evidence of  appendicitis.  Vascular/Lymphatic: No significant vascular findings  are present. No enlarged abdominal or pelvic lymph nodes.  Reproductive: Status post hysterectomy. No adnexal masses.  Other: Small amount of free peritoneal fluid in the pelvis.  Musculoskeletal: Mild dextroconvex lumbar scoliosis. Otherwise, unremarkable bones.  IMPRESSION: 1. Mild changes of previous colitis involving the right colon with probable minimal acute right colon colitis. 2. Marked diffuse hepatic steatosis. 3. Small amount of free peritoneal fluid in the pelvis.   Electronically Signed   By: Claudie Revering M.D.   On: 01/15/2020 19:37   I have personally rev iewed patient's abdominopelvic CT.  Assessment;  Patient is 49 year old Caucasian female who presents with 4-day history of nausea vomiting and nonbloody diarrhea.  CT reveals wall thickening primarily to proximal colon.  She is improving with symptomatic therapy.  Stool studies are pending.  I suspect we are dealing with infection but inflammatory bowel disease remains in differential diagnosis..  Macrocytic anemia.  Folate level is low.  Stool was guaiac positive.  Therefore anemia would appear to be multifactorial.  No history of hematemesis melena or rectal bleeding but her history may not be accurate.  If hemoglobin keeps dropping she may need endoscopic evaluation.  Alcoholic hepatitis.  She has mild elevation of AST, bilirubin and alkaline phosphatase but normal ALT.  Markers for hepatitis B and C are pending.  Hypokalemia and hypomagnesemia secondary to GI losses.  Patient is receiving both KCl and magnesium sulfate IV.  Recommendations;  Check INR with a.m. labs. Await results of stool studies. Will monitor electrolytes and H&H closely. Diet advanced to full liquids.   LOS: 0 days   Kattie Santoyo  01/16/2020, 5:36 PM

## 2020-01-17 DIAGNOSIS — K529 Noninfective gastroenteritis and colitis, unspecified: Secondary | ICD-10-CM

## 2020-01-17 DIAGNOSIS — N10 Acute tubulo-interstitial nephritis: Secondary | ICD-10-CM

## 2020-01-17 DIAGNOSIS — D649 Anemia, unspecified: Secondary | ICD-10-CM

## 2020-01-17 DIAGNOSIS — R7989 Other specified abnormal findings of blood chemistry: Secondary | ICD-10-CM

## 2020-01-17 DIAGNOSIS — R195 Other fecal abnormalities: Secondary | ICD-10-CM

## 2020-01-17 DIAGNOSIS — N12 Tubulo-interstitial nephritis, not specified as acute or chronic: Principal | ICD-10-CM

## 2020-01-17 DIAGNOSIS — R945 Abnormal results of liver function studies: Secondary | ICD-10-CM

## 2020-01-17 LAB — MAGNESIUM: Magnesium: 1.8 mg/dL (ref 1.7–2.4)

## 2020-01-17 LAB — COMPREHENSIVE METABOLIC PANEL
ALT: 38 U/L (ref 0–44)
AST: 61 U/L — ABNORMAL HIGH (ref 15–41)
Albumin: 1.9 g/dL — ABNORMAL LOW (ref 3.5–5.0)
Alkaline Phosphatase: 192 U/L — ABNORMAL HIGH (ref 38–126)
Anion gap: 8 (ref 5–15)
BUN: 9 mg/dL (ref 6–20)
CO2: 24 mmol/L (ref 22–32)
Calcium: 8.1 mg/dL — ABNORMAL LOW (ref 8.9–10.3)
Chloride: 105 mmol/L (ref 98–111)
Creatinine, Ser: 0.39 mg/dL — ABNORMAL LOW (ref 0.44–1.00)
GFR calc Af Amer: >60 mL/min (ref >60)
GFR calc non Af Amer: >60 mL/min (ref >60)
Glucose, Bld: 102 mg/dL — ABNORMAL HIGH (ref 70–99)
Potassium: 3.3 mmol/L — ABNORMAL LOW (ref 3.5–5.1)
Sodium: 137 mmol/L (ref 135–145)
Total Bilirubin: 1.3 mg/dL — ABNORMAL HIGH (ref 0.3–1.2)
Total Protein: 5.5 g/dL — ABNORMAL LOW (ref 6.5–8.1)

## 2020-01-17 LAB — FOLATE RBC
Folate, Hemolysate: 190 ng/mL
Folate, RBC: 720 ng/mL (ref >498)
Hematocrit: 26.4 % — ABNORMAL LOW (ref 34.0–46.6)

## 2020-01-17 LAB — PROTIME-INR
INR: 1.1 (ref 0.8–1.2)
Prothrombin Time: 13.8 seconds (ref 11.4–15.2)

## 2020-01-17 MED ORDER — POTASSIUM CHLORIDE CRYS ER 20 MEQ PO TBCR
40.0000 meq | EXTENDED_RELEASE_TABLET | Freq: Once | ORAL | Status: AC
  Administered 2020-01-17: 40 meq via ORAL
  Filled 2020-01-17: qty 2

## 2020-01-17 MED ORDER — MAGNESIUM SULFATE 2 GM/50ML IV SOLN
2.0000 g | Freq: Once | INTRAVENOUS | Status: AC
  Administered 2020-01-17: 2 g via INTRAVENOUS
  Filled 2020-01-17: qty 50

## 2020-01-17 MED ORDER — FOLIC ACID 1 MG PO TABS: 1.0000 mg | ORAL_TABLET | Freq: Every day | ORAL | Status: AC

## 2020-01-17 MED ORDER — METRONIDAZOLE IN NACL 5-0.79 MG/ML-% IV SOLN
500.0000 mg | Freq: Three times a day (TID) | INTRAVENOUS | Status: DC
  Administered 2020-01-17 – 2020-01-19 (×6): 500 mg via INTRAVENOUS
  Filled 2020-01-17 (×7): qty 100

## 2020-01-17 MED ORDER — CYANOCOBALAMIN 500 MCG PO TABS: 500.0000 ug | ORAL_TABLET | Freq: Every day | ORAL | Status: AC

## 2020-01-17 MED ORDER — SODIUM CHLORIDE 0.9 % IV SOLN
INTRAVENOUS | Status: DC | PRN
  Administered 2020-01-17 – 2020-01-18 (×2): 250 mL via INTRAVENOUS

## 2020-01-17 MED ORDER — HYDROCODONE-ACETAMINOPHEN 5-325 MG PO TABS
1.0000 | ORAL_TABLET | ORAL | Status: DC | PRN
  Administered 2020-01-17 – 2020-01-19 (×6): 1 via ORAL
  Filled 2020-01-17 (×6): qty 1

## 2020-01-17 NOTE — Plan of Care (Signed)
  Problem: Education: Goal: Knowledge of General Education information will improve Description: Including pain rating scale, medication(s)/side effects and non-pharmacologic comfort measures Outcome: Progressing   Problem: Clinical Measurements: Goal: Ability to maintain clinical measurements within normal limits will improve Outcome: Progressing Goal: Will remain free from infection Outcome: Progressing   

## 2020-01-17 NOTE — Progress Notes (Signed)
PROGRESS NOTE  Annette Lucas [id number] DOB: 07-09-1971 DOA: 01/15/2020 PCP: Rosalee Kaufman, PA-C  Brief History:  49 year old female with a history of alcohol abuse, irritable bowel syndrome presenting with headache, nausea, vomiting, and loose stools that began on 01/13/2020. The patient had denied any fevers, chills, hematochezia, melena, dysuria, hematuria. She did have some diffuse abdominal pain. After she began vomiting she developed chest pain that was substernal in nature. She denied any shortness of breath, coughing, hemoptysis. The patient has also been complaining of generalized weakness with tingling in her bilateral hands. She denies any focal extremity weakness, dysarthria, dysphagia, focal extremity weakness, facial droop. She continues to drink approximately 2 glasses of wine daily. She has been drinking daily for the past 5 years. She denies any other alcohol or illicit drug use. Since admission, the patient states that her emesis and loose stools have improved as well as her abdominal pain. But she continues to have generalized weakness and anorexia. CT of the abdomen and pelvis showed mild fat density wall thickening of the right colon with a minimal pericolonic soft tissue stranding. There was marked hepatic steatosis with a small amount of free peritoneal fluid. GI was consulted to assist with management.  Assessment/Plan: Pyelonephritis -UA>50 WBC -follow urine culture -follow vanc/aztreonam due to multiple abx allergies -This has contributed to the patient's nausea and vomiting  Colonic wall thickening/colitis/FOBT positive -GI consult appreciated-->outpatient [facility] -01/15/2020 CT abdomen/pelvis as discussed above -Continue vanc/aztreo and metronidazole -Clear liquid diet>>advance to soft diet -Continue IV fluids  Alcohol abuse -This is also contributed to her vomiting. -Suspect the patient has a degree of alcoholic gastritis -Alcohol withdrawal  protocol -Continue Protonix -PMP Aware queried-->no Rx for controlled substances x 3 years  Transaminasemia -hep B surface antigen--neg -hep C antibody--neg -Likely due to alcohol and hepatic steatosis -CT as discussed above  Atypical chest pain -Partly reproducible -Cycle troponins--neg x 4 -Personally reviewed EKG--sinus rhythm, nonspecific ST-T wave changes -Echo--EF 60-65%, no WMA  Hypomagnesemia/hypokalemia -Repleted  Alcohol Polyneuropathy  -B12--226-->replete -check folate 3.3-->replete      Disposition Plan: Patient From: Home D/C Place: Home - 4/23 Barriers: Not Clinically Stable--GI work up; cannot tolerate diet  Family Communication:  mother updated at bedside 4/22  Consultants:  GI  Code Status:  FULL  DVT Prophylaxis:  SCDs   Procedures: As Listed in Progress Note Above  Antibiotics: vanco 4/21>>> Aztreonam 4/21>>> Metronidazole 4/21>>>     Subjective:  Pt states abd pain is present but improving.  Wants solid food.  No BM in 2 days.  Denies f/c, sob, n/v/d.  Cp is improving.  No cough or hemoptysis Objective: Vitals:   01/16/20 1500 01/16/20 2057 01/16/20 2127 01/17/20 0531  BP: 106/65 110/84  128/89  Pulse: 91 100  (!) 105  Resp: 18 16  18   Temp: (!) 97.2 F (36.2 C) 98.7 F (37.1 C)  98.3 F (36.8 C)  TempSrc: Oral Oral  Oral  SpO2: 100% 100% 96% 96%  Weight:      Height:        Intake/Output Summary (Last 24 hours) at 01/17/2020 1112 Last data filed at 01/17/2020 0802 Gross per 24 hour  Intake 350 ml  Output 600 ml  Net -250 ml   Weight change:  Exam:   General:  Pt is alert, follows commands appropriately, not in acute distress  HEENT: No icterus, No thrush, No neck mass, [employer]/AT  Cardiovascular: RRR, S1/S2, no rubs,  no gallops  Respiratory: CTA bilaterally, no wheezing, no crackles, no rhonchi  Abdomen: Soft/+BS, non tender, non distended, no guarding  Extremities: No edema, No lymphangitis, No  petechiae, No rashes, no synovitis   Data Reviewed: I have personally reviewed following labs and imaging studies Basic Metabolic Panel: Recent Labs  Lab 01/15/20 1722 01/16/20 0418 01/17/20 0444  NA 134* 135 137  K 2.3* 2.8* 3.3*  CL 94* 98 105  CO2 25 27 24   GLUCOSE 104* 117* 102*  BUN 14 11 9   CREATININE 0.57 0.49 0.39*  CALCIUM 8.0* 7.8* 8.1*  MG 1.5* 2.3 1.8  PHOS 1.4*  --   --    Liver Function Tests: Recent Labs  Lab 01/15/20 1722 01/16/20 0418 01/17/20 0444  AST 76* 52* 61*  ALT 43 34 38  ALKPHOS 214* 177* 192*  BILITOT 2.2* 1.4* 1.3*  PROT 6.1* 5.3* 5.5*  ALBUMIN 2.2* 1.9* 1.9*   Recent Labs  Lab 01/15/20 1722  LIPASE 17   No results for input(s): AMMONIA in the last 168 hours. Coagulation Profile: Recent Labs  Lab 01/17/20 0444  INR 1.1   CBC: Recent Labs  Lab 01/15/20 1722 01/16/20 0418  WBC 12.4* 8.0  NEUTROABS 9.7*  --   HGB 10.0* 8.7*  HCT 30.0* 26.8*  MCV 112.8* 116.0*  PLT 282 235   Cardiac Enzymes: No results for input(s): CKTOTAL, CKMB, CKMBINDEX, TROPONINI in the last 168 hours. BNP: Invalid input(s): POCBNP CBG: No results for input(s): GLUCAP in the last 168 hours. HbA1C: No results for input(s): HGBA1C in the last 72 hours. Urine analysis:    Component Value Date/Time   COLORURINE AMBER (A) 01/16/2020 1107   APPEARANCEUR HAZY (A) 01/16/2020 1107   LABSPEC 1.031 (H) 01/16/2020 1107   PHURINE 5.0 01/16/2020 1107   GLUCOSEU NEGATIVE 01/16/2020 1107   HGBUR NEGATIVE 01/16/2020 1107   BILIRUBINUR NEGATIVE 01/16/2020 1107   KETONESUR NEGATIVE 01/16/2020 1107   PROTEINUR NEGATIVE 01/16/2020 1107   NITRITE NEGATIVE 01/16/2020 1107   LEUKOCYTESUR SMALL (A) 01/16/2020 1107   Sepsis Labs: @LABRCNTIP (procalcitonin:4,lacticidven:4) ) Recent Results (from the past 240 hour(s))  SARS CORONAVIRUS 2 ([name] 6-24 HRS) Nasopharyngeal Nasopharyngeal Swab     Status: None   Collection Time: 01/15/20  8:31 PM   Specimen:  Nasopharyngeal Swab  Result Value Ref Range Status   SARS Coronavirus 2 NEGATIVE NEGATIVE Final    Comment: (NOTE) SARS-CoV-2 target nucleic acids are NOT DETECTED. The SARS-CoV-2 RNA is generally detectable in upper and lower respiratory specimens during the acute phase of infection. Negative results do not preclude SARS-CoV-2 infection, do not rule out co-infections with other pathogens, and should not be used as the sole basis for treatment or other patient management decisions. Negative results must be combined with clinical observations, patient history, and epidemiological information. The expected result is Negative. Fact Sheet for Patients: SugarRoll.be Fact Sheet for Healthcare Providers: https://www.woods-mathews.com/ This test is not yet approved or cleared by the [place] FDA and  has been authorized for detection and/or diagnosis of SARS-CoV-2 by FDA under an Emergency Use Authorization (EUA). This EUA will remain  in effect (meaning this test can be used) for the duration of the COVID-19 declaration under Section 56 4(b)(1) of the Act, 21 U.S.C. section 360bbb-3(b)(1), unless the authorization is terminated or revoked sooner. Performed at [facility], [place] [address], [place], [place] [address]      Scheduled Meds: . folic acid  1 mg Oral Daily  . LORazepam  0-4 mg Intravenous Q6H   Followed by  . LORazepam  0-4 mg Intravenous Q12H  . multivitamin with minerals  1 tablet Oral Daily  . pantoprazole (PROTONIX) IV  40 mg Intravenous Q12H  . thiamine  100 mg Oral Daily   Or  . thiamine  100 mg Intravenous Daily  . vitamin B-12  500 mcg Oral Daily   Continuous Infusions: . sodium chloride 250 mL (01/17/20 0520)  . aztreonam 2 g (01/17/20 0522)  . magnesium sulfate bolus IVPB 2 g (01/17/20 1023)  . metronidazole    . vancomycin 750 mg (01/16/20 2049)    Procedures/Studies: CT Abdomen Pelvis W  Contrast  Result Date: 01/15/2020 CLINICAL DATA:  Nausea, vomiting and diarrhea for the past 2 days. Abdominal pain. EXAM: CT ABDOMEN AND PELVIS WITH CONTRAST TECHNIQUE: Multidetector CT imaging of the abdomen and pelvis was performed using the standard protocol following bolus administration of intravenous contrast. CONTRAST:  160mL OMNIPAQUE IOHEXOL 300 MG/ML  SOLN COMPARISON:  None. FINDINGS: Lower chest: Mild linear scarring at both lung bases. Hepatobiliary: Marked diffuse low density of the liver. Normal appearing gallbladder. Pancreas: Unremarkable. No pancreatic ductal dilatation or surrounding inflammatory changes. Spleen: Normal in size without focal abnormality. Adrenals/Urinary Tract: Small amount of air in the urinary bladder, compatible with recent catheterization. Normal appearing adrenal glands, kidneys and ureters. Stomach/Bowel: Lipomatous changes involving the ileocecal valve. Mild fat density wall thickening involving the right colon. Minimal right pericolonic soft tissue stranding. The remainder of the colon is unremarkable. Unremarkable stomach and small bowel. No evidence of appendicitis. Vascular/Lymphatic: No significant vascular findings are present. No enlarged abdominal or pelvic lymph nodes. Reproductive: Status post hysterectomy. No adnexal masses. Other: Small amount of free peritoneal fluid in the pelvis. Musculoskeletal: Mild dextroconvex lumbar scoliosis. Otherwise, unremarkable bones. IMPRESSION: 1. Mild changes of previous colitis involving the right colon with probable minimal acute right colon colitis. 2. Marked diffuse hepatic steatosis. 3. Small amount of free peritoneal fluid in the pelvis. Electronically Signed   By: Claudie Revering M.D.   On: 01/15/2020 19:37   DG Chest Portable 1 View  Result Date: 01/15/2020 CLINICAL DATA:  Chest pain EXAM: PORTABLE CHEST 1 VIEW COMPARISON:  None. FINDINGS: The heart size and mediastinal contours are within normal limits. Both lungs  are clear. The visualized skeletal structures are unremarkable. IMPRESSION: No active disease. Electronically Signed   By: Prudencio Pair M.D.   On: 01/15/2020 18:13   ECHOCARDIOGRAM COMPLETE  Result Date: 01/16/2020    ECHOCARDIOGRAM REPORT   Patient Name:   CLARENCE GREAVER Date of Exam: 01/16/2020 Medical Rec #:  [id number]     Height:       64.0 in Accession #:    [id number]    Weight:       144.4 lb Date of Birth:  1971-07-03     BSA:          1.703 m Patient Age:    31 years      BP:           115/76 mmHg Patient Gender: F             HR:           86 bpm. Exam Location:  [facility] Procedure: 2D Echo Indications:    Chest Pain 786.50 / R07.9  History:        Patient has no prior history of Echocardiogram examinations.  Signs/Symptoms:Chest Pain; Risk Factors:Non-Smoker. GERD, ETOH.  Sonographer:    Leavy Cella RDCS (AE) Referring Phys: [phone] K[place]en Choe IMPRESSIONS  1. Left ventricular ejection fraction, by estimation, is 60 to 65%. The left ventricle has normal function. The left ventricle has no regional wall motion abnormalities. Left ventricular diastolic parameters were normal.  2. Right ventricular systolic function is normal. The right ventricular size is normal.  3. The mitral valve is normal in structure. Trivial mitral valve regurgitation.  4. The aortic valve is tricuspid. Aortic valve regurgitation is not visualized. No aortic stenosis is present. FINDINGS  Left Ventricle: Left ventricular ejection fraction, by estimation, is 60 to 65%. The left ventricle has normal function. The left ventricle has no regional wall motion abnormalities. The left ventricular internal cavity size was normal in size. There is  no left ventricular hypertrophy. Left ventricular diastolic parameters were normal. Right Ventricle: The right ventricular size is normal. No increase in right ventricular wall thickness. Right ventricular systolic function is normal. Left Atrium: Left atrial size was normal in  size. Right Atrium: Right atrial size was normal in size. Pericardium: There is no evidence of pericardial effusion. Mitral Valve: The mitral valve is normal in structure. Trivial mitral valve regurgitation. Tricuspid Valve: The tricuspid valve is grossly normal. Tricuspid valve regurgitation is not demonstrated. Aortic Valve: The aortic valve is tricuspid. Aortic valve regurgitation is not visualized. No aortic stenosis is present. Pulmonic Valve: The pulmonic valve was grossly normal. Pulmonic valve regurgitation is not visualized. Aorta: The aortic root is normal in size and structure. Venous: The inferior vena cava was not well visualized. IAS/Shunts: The interatrial septum was not well visualized.  LEFT VENTRICLE PLAX 2D LVIDd:         4.06 cm  Diastology LVIDs:         2.44 cm  LV e' lateral:   8.16 cm/s LV PW:         1.06 cm  LV E/e' lateral: 10.1 LV IVS:        0.73 cm  LV e' medial:    7.18 cm/s LVOT diam:     1.70 cm  LV E/e' medial:  11.5 LVOT Area:     2.27 cm  RIGHT VENTRICLE RV S prime:     14.30 cm/s TAPSE (M-mode): 1.6 cm LEFT ATRIUM             Index LA diam:        3.60 cm 2.11 cm/m LA Vol (A2C):   35.9 ml 21.07 ml/m LA Vol (A4C):   37.6 ml 22.07 ml/m LA Biplane Vol: 37.2 ml 21.84 ml/m   AORTA Ao Root diam: 2.30 cm MITRAL VALVE MV Area (PHT): 2.74 cm    SHUNTS MV Decel Time: 277 msec    Systemic Diam: 1.70 cm MV E velocity: 82.70 cm/s MV A velocity: 69.00 cm/s MV E/A ratio:  1.20 Kate Sable MD Electronically signed by Kate Sable MD Signature Date/Time: 01/16/2020/11:29:57 AM    Final     Orson Eva, DO  [facility]  If 7PM-7AM, please contact night-coverage [email or web address] Password [email or web address] 01/17/2020, 11:12 AM   LOS: 1 day

## 2020-01-17 NOTE — Evaluation (Signed)
Physical Therapy Evaluation Patient Details Name: Annette Lucas MRN: [id number] DOB: Sep 07, 1971 Today's Date: 01/17/2020   History of Present Illness  49 year old female with a history of alcohol abuse, irritable bowel syndrome presenting with headache, nausea, vomiting, and loose stools that began on 01/13/2020. The patient had denied any fevers, chills, hematochezia, melena, dysuria, hematuria. She did have some diffuse abdominal pain. After she began vomiting she developed chest pain that was substernal in nature. She denied any shortness of breath, coughing, hemoptysis. The patient has also been complaining of generalized weakness with tingling in her bilateral hands. She denies any focal extremity weakness, dysarthria, dysphagia, focal extremity weakness, facial droop. She continues to drink approximately 2 glasses of wine daily. She has been drinking daily for the past 5 years.  Clinical Impression  Pt admitted with above diagnosis. Pt with increased shakiness with mobility requiring more assistance to steady and mobilize out of the bed. Pt requires mod assist with transferring to bedside chair and with limited steps at bedside. Pt becomes fatigued with steps and standing marching demonstrating BLE bracing on front of chair and increase BUE weight-bearing on RW to prop self up and only sits down when cued by therapist, demonstrating poor safety awareness. Pt tolerates remaining up in chair with mom in room and call bell in lap. Significant education for pt participation with therapy and encouragement to call nursing to transfer to bedside toilet and get out of bed as much as possible to prevent increased deconditioning. Additional education on reality of physical assistance pt is currently requiring increasing pt's risk for falls and why therapist is recommending SNF at this time.  Pt currently with functional limitations due to the deficits listed below (see PT Problem List). Pt will benefit from  skilled PT to increase their independence and safety with mobility to allow discharge to the venue listed below.       Follow Up Recommendations SNF;Supervision/Assistance - 24 hour(vs home with max HH support)    Equipment Recommendations  Rolling Cornick with 5" wheels;3in1 (PT)    Recommendations for Other Services       Precautions / Restrictions Precautions Precautions: Fall Restrictions Weight Bearing Restrictions: No      Mobility  Bed Mobility Overal bed mobility: Needs Assistance Bed Mobility: Supine to Sit     Supine to sit: Min guard;HOB elevated     General bed mobility comments: increased time, use of bedrail and elevated HOB to achieve upsitting sitting at EOB, min assist to scoot to EOB to place feet flat on floor  Transfers Overall transfer level: Needs assistance Equipment used: Rolling Sobh (2 wheeled) Transfers: Sit to/from [facility] Sit to Stand: Min assist Stand pivot transfers: Mod assist       General transfer comment: cues for hand placement, min assist to power up, increased unsteadiness and shakiness upon standing requiring increased assistance to maintain balance, mod assist to manage RW and sequence mobility to safely transfer to bedside chair  Ambulation/Gait Ambulation/Gait assistance: Mod assist Gait Distance (Feet): 2 Feet Assistive device: Rolling Blatchley (2 wheeled) Gait Pattern/deviations: Step-to pattern;Decreased stride length Gait velocity: decreased   General Gait Details: ataxic-like gait due to increased full body shakiness upon standing, cues for upright posture and assist to maintain body within RW frame  Stairs            Wheelchair Mobility    Modified Rankin (Stroke Patients Only)       Balance Overall balance assessment: Needs assistance Sitting-balance support: Feet  supported;No upper extremity supported Sitting balance-Leahy Scale: Fair Sitting balance - Comments: seated EOB, close  SUPV   Standing balance support: During functional activity;Bilateral upper extremity supported Standing balance-Leahy Scale: Poor Standing balance comment: with RW              Pertinent Vitals/Pain Pain Assessment: Faces Faces Pain Scale: Hurts little more Pain Location: with mobility Pain Descriptors / Indicators: Grimacing Pain Intervention(s): Limited activity within patient's tolerance;Monitored during session;Repositioned    Home Living Family/patient expects to be discharged to:: Private residence Living Arrangements: Alone Available Help at Discharge: Family;Available 24 hours/day Type of Home: House Home Access: Stairs to enter Entrance Stairs-Rails: Right Entrance Stairs-Number of Steps: 2 Home Layout: One level Home Equipment: None      Prior Function Level of Independence: Independent         Comments: 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 Pt's mom in room and reports pt can d/c home with her and spouse (home 2 steps to enter, 1 story).     Hand Dominance        Extremity/Trunk Assessment   Upper Extremity Assessment Upper Extremity Assessment: Overall WFL for tasks assessed    Lower Extremity Assessment Lower Extremity Assessment: Generalized weakness(BLE AROM WNL, grossly 3+/5, bil foot numbness/neuropathy)    Cervical / Trunk Assessment Cervical / Trunk Assessment: Normal  Communication   Communication: No difficulties  Cognition Arousal/Alertness: Awake/alert Behavior During Therapy: WFL for tasks assessed/performed Overall Cognitive Status: Within Functional Limits for tasks assessed      General Comments: Pt appears to be unrealistic about current functional status.      General Comments      Exercises     Assessment/Plan    PT Assessment Patient needs continued PT services  PT Problem List Decreased strength;Decreased range of motion;Decreased activity tolerance;Decreased  balance;Decreased mobility;Decreased coordination;Decreased knowledge of use of DME;Decreased safety awareness;Impaired sensation;Pain       PT Treatment Interventions DME instruction;Gait training;Functional mobility training;Therapeutic activities;Therapeutic exercise;Balance training;Neuromuscular re-education;Patient/family education;Stair training;Modalities    PT Goals (Current goals can be found in the Care Plan section)  Acute Rehab PT Goals Patient Stated Goal: return home or with parents temporarily PT Goal Formulation: With patient/family Time For Goal Achievement: 01/31/20 Potential to Achieve Goals: Good    Frequency Min 2X/week   Barriers to discharge        Co-evaluation               AM-PAC PT "6 Clicks" Mobility  Outcome Measure Help needed turning from your back to your side while in a flat bed without using bedrails?: A Little Help needed moving from lying on your back to sitting on the side of a flat bed without using bedrails?: A Little Help needed moving to and from a bed to a chair (including a wheelchair)?: A Lot Help needed standing up from a chair using your arms (e.g., wheelchair or bedside chair)?: A Little Help needed to walk in hospital room?: A Lot Help needed climbing 3-5 steps with a railing? : Total 6 Click Score: 14    End of Session Equipment Utilized During Treatment: Gait belt Activity Tolerance: Patient limited by fatigue Patient left: in chair;with call bell/phone within reach;with family/visitor present Nurse Communication: Mobility status PT Visit Diagnosis: Unsteadiness on feet (R26.81);Other abnormalities of gait and mobility (R26.89);Muscle weakness (generalized) (M62.81)    Time: [id number] PT Time Calculation (min) (ACUTE ONLY): 26 min   Charges:  PT Evaluation $PT Eval Low Complexity: 1 Low PT Treatments $Self Care/Home Management: 8-22         Tori Saliha Salts PT, DPT 01/17/20, 2:13 PM [phone]

## 2020-01-17 NOTE — Plan of Care (Signed)
  Problem: Education: Goal: Knowledge of General Education information will improve Description: Including pain rating scale, medication(s)/side effects and non-pharmacologic comfort measures 01/17/2020 1122 by Santa Lighter, RN Outcome: Progressing 01/17/2020 1120 by Santa Lighter, RN Outcome: Progressing   Problem: Clinical Measurements: Goal: Ability to maintain clinical measurements within normal limits will improve Outcome: Progressing Goal: Will remain free from infection 01/17/2020 1122 by Santa Lighter, RN Outcome: Progressing 01/17/2020 1120 by Santa Lighter, RN Outcome: Progressing

## 2020-01-17 NOTE — Progress Notes (Signed)
Subjective:  No vomiting or BM in two days. Abdomen is sore all over but worse in the epigastric region. Feels hungry. Would like to advance diet.   Objective: Vital signs in last 24 hours: Temp:  [97.2 F (36.2 C)-98.7 F (37.1 C)] 98.3 F (36.8 C) (04/22 0531) Pulse Rate:  [91-105] 105 (04/22 0531) Resp:  [16-18] 18 (04/22 0531) BP: (106-128)/(65-89) 128/89 (04/22 0531) SpO2:  [96 %-100 %] 96 % (04/22 0531) Last BM Date: 01/16/20 General:   Alert,  Well-developed, well-nourished, pleasant and cooperative in NAD Head:  Normocephalic and atraumatic. Eyes:  Sclera clear, no icterus.  Abdomen:  Soft, mild epigastric tenderness and nondistended. Normal bowel sounds, without guarding, and without rebound.   Extremities:  Without clubbing, deformity or edema. Neurologic:  Alert and  oriented x4;  grossly normal neurologically. Skin:  Intact without significant lesions or rashes. Psych:  Alert and cooperative. Normal mood and affect.  Intake/Output from previous day: 04/21 0701 - 04/22 0700 In: 350 [IV Piggyback:350] Out: -  Intake/Output this shift: Total I/O In: -  Out: 600 [Urine:600]  Lab Results: CBC Recent Labs    01/15/20 1722 01/16/20 0418  WBC 12.4* 8.0  HGB 10.0* 8.7*  HCT 30.0* 26.8*  MCV 112.8* 116.0*  PLT 282 235   BMET Recent Labs    01/15/20 1722 01/16/20 0418 01/17/20 0444  NA 134* 135 137  K 2.3* 2.8* 3.3*  CL 94* 98 105  CO2 25 27 24   GLUCOSE 104* 117* 102*  BUN 14 11 9   CREATININE 0.57 0.49 0.39*  CALCIUM 8.0* 7.8* 8.1*   LFTs Recent Labs    01/15/20 1722 01/16/20 0418 01/17/20 0444  BILITOT 2.2* 1.4* 1.3*  ALKPHOS 214* 177* 192*  AST 76* 52* 61*  ALT 43 34 38  PROT 6.1* 5.3* 5.5*  ALBUMIN 2.2* 1.9* 1.9*   Recent Labs    01/15/20 1722  LIPASE 17   PT/INR Recent Labs    01/17/20 0444  LABPROT 13.8  INR 1.1      Imaging Studies: CT Abdomen Pelvis W Contrast  Result Date: 01/15/2020 CLINICAL DATA:  Nausea, vomiting and  diarrhea for the past 2 days. Abdominal pain. EXAM: CT ABDOMEN AND PELVIS WITH CONTRAST TECHNIQUE: Multidetector CT imaging of the abdomen and pelvis was performed using the standard protocol following bolus administration of intravenous contrast. CONTRAST:  174mL OMNIPAQUE IOHEXOL 300 MG/ML  SOLN COMPARISON:  None. FINDINGS: Lower chest: Mild linear scarring at both lung bases. Hepatobiliary: Marked diffuse low density of the liver. Normal appearing gallbladder. Pancreas: Unremarkable. No pancreatic ductal dilatation or surrounding inflammatory changes. Spleen: Normal in size without focal abnormality. Adrenals/Urinary Tract: Small amount of air in the urinary bladder, compatible with recent catheterization. Normal appearing adrenal glands, kidneys and ureters. Stomach/Bowel: Lipomatous changes involving the ileocecal valve. Mild fat density wall thickening involving the right colon. Minimal right pericolonic soft tissue stranding. The remainder of the colon is unremarkable. Unremarkable stomach and small bowel. No evidence of appendicitis. Vascular/Lymphatic: No significant vascular findings are present. No enlarged abdominal or pelvic lymph nodes. Reproductive: Status post hysterectomy. No adnexal masses. Other: Small amount of free peritoneal fluid in the pelvis. Musculoskeletal: Mild dextroconvex lumbar scoliosis. Otherwise, unremarkable bones. IMPRESSION: 1. Mild changes of previous colitis involving the right colon with probable minimal acute right colon colitis. 2. Marked diffuse hepatic steatosis. 3. Small amount of free peritoneal fluid in the pelvis. Electronically Signed   By: Claudie Revering M.D.   On: 01/15/2020 19:37  DG Chest Portable 1 View  Result Date: 01/15/2020 CLINICAL DATA:  Chest pain EXAM: PORTABLE CHEST 1 VIEW COMPARISON:  None. FINDINGS: The heart size and mediastinal contours are within normal limits. Both lungs are clear. The visualized skeletal structures are unremarkable. IMPRESSION:  No active disease. Electronically Signed   By: Prudencio Pair M.D.   On: 01/15/2020 18:13   ECHOCARDIOGRAM COMPLETE  Result Date: 01/16/2020    ECHOCARDIOGRAM REPORT   Patient Name:   Annette Lucas Date of Exam: 01/16/2020 Medical Rec #:  [id number]     Height:       64.0 in Accession #:    [id number]    Weight:       144.4 lb Date of Birth:  03-23-1971     BSA:          1.703 m Patient Age:    49 years      BP:           115/76 mmHg Patient Gender: F             HR:           86 bpm. Exam Location:  [facility] Procedure: 2D Echo Indications:    Chest Pain 786.50 / R07.9  History:        Patient has no prior history of Echocardiogram examinations.                 Signs/Symptoms:Chest Pain; Risk Factors:Non-Smoker. GERD, ETOH.  Sonographer:    Leavy Cella RDCS (AE) Referring Phys: [phone] DAVID TAT IMPRESSIONS  1. Left ventricular ejection fraction, by estimation, is 60 to 65%. The left ventricle has normal function. The left ventricle has no regional wall motion abnormalities. Left ventricular diastolic parameters were normal.  2. Right ventricular systolic function is normal. The right ventricular size is normal.  3. The mitral valve is normal in structure. Trivial mitral valve regurgitation.  4. The aortic valve is tricuspid. Aortic valve regurgitation is not visualized. No aortic stenosis is present. FINDINGS  Left Ventricle: Left ventricular ejection fraction, by estimation, is 60 to 65%. The left ventricle has normal function. The left ventricle has no regional wall motion abnormalities. The left ventricular internal cavity size was normal in size. There is  no left ventricular hypertrophy. Left ventricular diastolic parameters were normal. Right Ventricle: The right ventricular size is normal. No increase in right ventricular wall thickness. Right ventricular systolic function is normal. Left Atrium: Left atrial size was normal in size. Right Atrium: Right atrial size was normal in size. Pericardium: There is  no evidence of pericardial effusion. Mitral Valve: The mitral valve is normal in structure. Trivial mitral valve regurgitation. Tricuspid Valve: The tricuspid valve is grossly normal. Tricuspid valve regurgitation is not demonstrated. Aortic Valve: The aortic valve is tricuspid. Aortic valve regurgitation is not visualized. No aortic stenosis is present. Pulmonic Valve: The pulmonic valve was grossly normal. Pulmonic valve regurgitation is not visualized. Aorta: The aortic root is normal in size and structure. Venous: The inferior vena cava was not well visualized. IAS/Shunts: The interatrial septum was not well visualized.  LEFT VENTRICLE PLAX 2D LVIDd:         4.06 cm  Diastology LVIDs:         2.44 cm  LV e' lateral:   8.16 cm/s LV PW:         1.06 cm  LV E/e' lateral: 10.1 LV IVS:        0.73 cm  LV e'  medial:    7.18 cm/s LVOT diam:     1.70 cm  LV E/e' medial:  11.5 LVOT Area:     2.27 cm  RIGHT VENTRICLE RV S prime:     14.30 cm/s TAPSE (M-mode): 1.6 cm LEFT ATRIUM             Index LA diam:        3.60 cm 2.11 cm/m LA Vol (A2C):   35.9 ml 21.07 ml/m LA Vol (A4C):   37.6 ml 22.07 ml/m LA Biplane Vol: 37.2 ml 21.84 ml/m   AORTA Ao Root diam: 2.30 cm MITRAL VALVE MV Area (PHT): 2.74 cm    SHUNTS MV Decel Time: 277 msec    Systemic Diam: 1.70 cm MV E velocity: 82.70 cm/s MV A velocity: 69.00 cm/s MV E/A ratio:  1.20 Kate Sable MD Electronically signed by Kate Sable MD Signature Date/Time: 01/16/2020/11:29:57 AM    Final   [2 weeks]   Assessment: 49 year old female with history of alcohol abuse, IBS presenting with 4-day history of acute onset nausea, vomiting, nonbloody diarrhea associated with abdominal pain.  She developed substernal chest pain after several episodes of vomiting.  Felt to be noncardiac.  Colitis: Involving primarily the proximal colon on CT. Improving with symptomatic therapy.  Stool studies not yet collected.  Suspect infectious etiology but cannot exclude IBD.  Patient is currently on Vanc/aztreonam IV  Macrocytic anemia: Folate level is low. No evidence of iron deficiency. Hgb on admission 10.0 and down to 8.7 yesterday. No overt GI bleeding. She is heme positive. Remote EGD. No prior colonoscopy.  Abnormal LFTs: Likely alcoholic hepatitis. DF 10. Marked hepatic steatosis noted on CT.  Numbers improving. Iron sats 92%, ferritin 326 possible due to etoh abuse. Hepatitis B surface antigen and HCV Ab both negative.   Plan: 1. Collect stool studies if recurrent diarrhea. 2. Complete antibiotic therapy. 3. Await urine culture.  4. Recommend etoh cessation.  5. Soft diet.  6. Consider outpatient colonoscopy +/- EGD to evaluate anemia, heme + stool, colon findings noted on CT.   Laureen Ochs. Bernarda Caffey [facility] [phone] 4/22/202111:08 AM     LOS: 1 day

## 2020-01-17 NOTE — Discharge Summary (Signed)
Physician Discharge Summary  Annette Lucas [id number] DOB: August 24, 1971 DOA: 01/15/2020  PCP: Rosalee Kaufman, PA-C  Admit date: 01/15/2020 Discharge date: 01/19/2020  Admitted From: Home Disposition:  Home   Recommendations for Outpatient Follow-up:  1. Follow up with PCP in 1-2 weeks 2. Please obtain BMP/CBC in one week     Discharge Condition: Stable CODE STATUS: FULL Diet recommendation: Heart Healthy   Brief/Interim Summary: 49 year old female with a history of alcohol abuse, irritable bowel syndrome presenting with headache, nausea, vomiting, and loose stools that began on 01/13/2020. The patient had denied any fevers, chills, hematochezia, melena, dysuria, hematuria. She did have some diffuse abdominal pain. After she began vomiting she developed chest pain that was substernal in nature. She denied any shortness of breath, coughing, hemoptysis. The patient has also been complaining of generalized weakness with tingling in her bilateral hands. She denies any focal extremity weakness, dysarthria, dysphagia, focal extremity weakness, facial droop. She continues to drink approximately 2 glasses of wine daily. She has been drinking daily for the past 5 years. She denies any other alcohol or illicit drug use. Since admission, the patient states that her emesis and loose stools have improved as well as her abdominal pain. But she continues to have generalized weakness and anorexia. CT of the abdomen and pelvis showed mild fat density wall thickening of the right colon with a minimal pericolonic soft tissue stranding. There was marked hepatic steatosis with a small amount of free peritoneal fluid. GI was consulted to assist with management.   Discharge Diagnoses:  Pyelonephritis -UA>50 WBC -follow urine culture--GNR -followvanc/aztreonam due to multiple abx allergies -This has contributed to the patient's nausea and vomiting -d/c home with TMP/SMZ  Colonic wall  thickening/colitis/FOBT positive -GI consult appreciated-->outpatient TCS -01/15/2020 CT abdomen/pelvis as discussed above -Continuevanc/aztreoand metronidazole -Clear liquid diet>>advance to soft diet which pt tolerated -Continued IV fluids -d/c home with TMP/SMZ and metronidazole x 6 more days -pt educated not to drink any alcohol with metronidazole  Alcohol abuse -This is also contributed to her vomiting. -Suspect the patient has a degree of alcoholic gastritis -Alcohol withdrawal protocol -Continue Protonix -PMP Aware queried-->no Rx for controlled substances x 3 years  Transaminasemia -hep B surface antigen--neg -hep C antibody--neg -Likely due to alcohol and hepatic steatosis -CT as discussed above  Atypical chest pain -Partly reproducible -Cycle troponins--neg x 4 -Personally reviewed EKG--sinus rhythm, nonspecific ST-T wave changes -Echo--EF 60-65%, no WMA  Hypomagnesemia/hypokalemia -Repleted -d/c home with mag ox daily  Alcohol Polyneuropathy -B12--226-->replete -check folate 3.3-->replete   Discharge Instructions   Allergies as of 01/17/2020      Reactions   Levofloxacin Anaphylaxis   Other Anaphylaxis   Meat allergy: Beef, pork, lamb   Penicillins Anaphylaxis   Has patient had a PCN reaction causing immediate rash, facial/tongue/throat swelling, SOB or lightheadedness with hypotension: Yes Has patient had a PCN reaction causing severe rash involving mucus membranes or skin necrosis: No Has patient had a PCN reaction that required hospitalization Yes Has patient had a PCN reaction occurring within the last 10 years: No If all of the above answers are "NO", then may proceed with Cephalosporin use.      Medication List    STOP taking these medications   HYDROcodone-acetaminophen 5-325 MG tablet Commonly known as: Norco   hyoscyamine 0.125 MG/5ML Elix Commonly known as: LEVSIN   ondansetron 8 MG disintegrating tablet Commonly known as:  ZOFRAN-ODT     TAKE these medications   cetirizine 10 MG tablet Commonly known  as: ZYRTEC Take 10 mg by mouth daily as needed.   folic acid 1 MG tablet Commonly known as: FOLVITE Take 1 tablet (1 mg total) by mouth daily. Start taking on: January 18, 2020   ibuprofen 200 MG tablet Commonly known as: ADVIL Take 400 mg by mouth every 6 (six) hours as needed. For pain.   vitamin B-12 500 MCG tablet Commonly known as: CYANOCOBALAMIN Take 1 tablet (500 mcg total) by mouth daily. Start taking on: January 18, 2020       Allergies  Allergen Reactions  . Levofloxacin Anaphylaxis  . Other Anaphylaxis    Meat allergy: Beef, pork, lamb  . Penicillins Anaphylaxis    Has patient had a PCN reaction causing immediate rash, facial/tongue/throat swelling, SOB or lightheadedness with hypotension: Yes Has patient had a PCN reaction causing severe rash involving mucus membranes or skin necrosis: No Has patient had a PCN reaction that required hospitalization Yes Has patient had a PCN reaction occurring within the last 10 years: No If all of the above answers are "NO", then may proceed with Cephalosporin use.     Consultations:  none   Procedures/Studies: CT Abdomen Pelvis W Contrast  Result Date: 01/15/2020 CLINICAL DATA:  Nausea, vomiting and diarrhea for the past 2 days. Abdominal pain. EXAM: CT ABDOMEN AND PELVIS WITH CONTRAST TECHNIQUE: Multidetector CT imaging of the abdomen and pelvis was performed using the standard protocol following bolus administration of intravenous contrast. CONTRAST:  146mL OMNIPAQUE IOHEXOL 300 MG/ML  SOLN COMPARISON:  None. FINDINGS: Lower chest: Mild linear scarring at both lung bases. Hepatobiliary: Marked diffuse low density of the liver. Normal appearing gallbladder. Pancreas: Unremarkable. No pancreatic ductal dilatation or surrounding inflammatory changes. Spleen: Normal in size without focal abnormality. Adrenals/Urinary Tract: Small amount of air in the  urinary bladder, compatible with recent catheterization. Normal appearing adrenal glands, kidneys and ureters. Stomach/Bowel: Lipomatous changes involving the ileocecal valve. Mild fat density wall thickening involving the right colon. Minimal right pericolonic soft tissue stranding. The remainder of the colon is unremarkable. Unremarkable stomach and small bowel. No evidence of appendicitis. Vascular/Lymphatic: No significant vascular findings are present. No enlarged abdominal or pelvic lymph nodes. Reproductive: Status post hysterectomy. No adnexal masses. Other: Small amount of free peritoneal fluid in the pelvis. Musculoskeletal: Mild dextroconvex lumbar scoliosis. Otherwise, unremarkable bones. IMPRESSION: 1. Mild changes of previous colitis involving the right colon with probable minimal acute right colon colitis. 2. Marked diffuse hepatic steatosis. 3. Small amount of free peritoneal fluid in the pelvis. Electronically Signed   By: Claudie Revering M.D.   On: 01/15/2020 19:37   DG Chest Portable 1 View  Result Date: 01/15/2020 CLINICAL DATA:  Chest pain EXAM: PORTABLE CHEST 1 VIEW COMPARISON:  None. FINDINGS: The heart size and mediastinal contours are within normal limits. Both lungs are clear. The visualized skeletal structures are unremarkable. IMPRESSION: No active disease. Electronically Signed   By: Prudencio Pair M.D.   On: 01/15/2020 18:13   ECHOCARDIOGRAM COMPLETE  Result Date: 01/16/2020    ECHOCARDIOGRAM REPORT   Patient Name:   Annette Lucas Date of Exam: 01/16/2020 Medical Rec #:  [id number]     Height:       64.0 in Accession #:    [id number]    Weight:       144.4 lb Date of Birth:  November 03, 1970     BSA:          1.703 m Patient Age:    80 years  BP:           115/76 mmHg Patient Gender: F             HR:           86 bpm. Exam Location:  [facility] Procedure: 2D Echo Indications:    Chest Pain 786.50 / R07.9  History:        Patient has no prior history of Echocardiogram examinations.                  Signs/Symptoms:Chest Pain; Risk Factors:Non-Smoker. GERD, ETOH.  Sonographer:    Leavy Cella RDCS (AE) Referring Phys: [phone] Lanasia Porras IMPRESSIONS  1. Left ventricular ejection fraction, by estimation, is 60 to 65%. The left ventricle has normal function. The left ventricle has no regional wall motion abnormalities. Left ventricular diastolic parameters were normal.  2. Right ventricular systolic function is normal. The right ventricular size is normal.  3. The mitral valve is normal in structure. Trivial mitral valve regurgitation.  4. The aortic valve is tricuspid. Aortic valve regurgitation is not visualized. No aortic stenosis is present. FINDINGS  Left Ventricle: Left ventricular ejection fraction, by estimation, is 60 to 65%. The left ventricle has normal function. The left ventricle has no regional wall motion abnormalities. The left ventricular internal cavity size was normal in size. There is  no left ventricular hypertrophy. Left ventricular diastolic parameters were normal. Right Ventricle: The right ventricular size is normal. No increase in right ventricular wall thickness. Right ventricular systolic function is normal. Left Atrium: Left atrial size was normal in size. Right Atrium: Right atrial size was normal in size. Pericardium: There is no evidence of pericardial effusion. Mitral Valve: The mitral valve is normal in structure. Trivial mitral valve regurgitation. Tricuspid Valve: The tricuspid valve is grossly normal. Tricuspid valve regurgitation is not demonstrated. Aortic Valve: The aortic valve is tricuspid. Aortic valve regurgitation is not visualized. No aortic stenosis is present. Pulmonic Valve: The pulmonic valve was grossly normal. Pulmonic valve regurgitation is not visualized. Aorta: The aortic root is normal in size and structure. Venous: The inferior vena cava was not well visualized. IAS/Shunts: The interatrial septum was not well visualized.  LEFT VENTRICLE PLAX 2D  LVIDd:         4.06 cm  Diastology LVIDs:         2.44 cm  LV e' lateral:   8.16 cm/s LV PW:         1.06 cm  LV E/e' lateral: 10.1 LV IVS:        0.73 cm  LV e' medial:    7.18 cm/s LVOT diam:     1.70 cm  LV E/e' medial:  11.5 LVOT Area:     2.27 cm  RIGHT VENTRICLE RV S prime:     14.30 cm/s TAPSE (M-mode): 1.6 cm LEFT ATRIUM             Index LA diam:        3.60 cm 2.11 cm/m LA Vol (A2C):   35.9 ml 21.07 ml/m LA Vol (A4C):   37.6 ml 22.07 ml/m LA Biplane Vol: 37.2 ml 21.84 ml/m   AORTA Ao Root diam: 2.30 cm MITRAL VALVE MV Area (PHT): 2.74 cm    SHUNTS MV Decel Time: 277 msec    Systemic Diam: 1.70 cm MV E velocity: 82.70 cm/s MV A velocity: 69.00 cm/s MV E/A ratio:  1.20 Kate Sable MD Electronically signed by Kate Sable MD Signature Date/Time: 01/16/2020/11:29:57  AM    Final         Discharge Exam: Vitals:   01/16/20 2127 01/17/20 0531  BP:  128/89  Pulse:  (!) 105  Resp:  18  Temp:  98.3 F (36.8 C)  SpO2: 96% 96%   Vitals:   01/16/20 1500 01/16/20 2057 01/16/20 2127 01/17/20 0531  BP: 106/65 110/84  128/89  Pulse: 91 100  (!) 105  Resp: 18 16  18   Temp: (!) 97.2 F (36.2 C) 98.7 F (37.1 C)  98.3 F (36.8 C)  TempSrc: Oral Oral  Oral  SpO2: 100% 100% 96% 96%  Weight:      Height:        General: Pt is alert, awake, not in acute distress Cardiovascular: RRR, S1/S2 +, no rubs, no gallops Respiratory: bibasilar crackles. No wheeze Abdominal: Soft, NT, ND, bowel sounds + Extremities: 1 + LE edema, no cyanosis   The results of significant diagnostics from this hospitalization (including imaging, microbiology, ancillary and laboratory) are listed below for reference.    Significant Diagnostic Studies: CT Abdomen Pelvis W Contrast  Result Date: 01/15/2020 CLINICAL DATA:  Nausea, vomiting and diarrhea for the past 2 days. Abdominal pain. EXAM: CT ABDOMEN AND PELVIS WITH CONTRAST TECHNIQUE: Multidetector CT imaging of the abdomen and pelvis was performed  using the standard protocol following bolus administration of intravenous contrast. CONTRAST:  181mL OMNIPAQUE IOHEXOL 300 MG/ML  SOLN COMPARISON:  None. FINDINGS: Lower chest: Mild linear scarring at both lung bases. Hepatobiliary: Marked diffuse low density of the liver. Normal appearing gallbladder. Pancreas: Unremarkable. No pancreatic ductal dilatation or surrounding inflammatory changes. Spleen: Normal in size without focal abnormality. Adrenals/Urinary Tract: Small amount of air in the urinary bladder, compatible with recent catheterization. Normal appearing adrenal glands, kidneys and ureters. Stomach/Bowel: Lipomatous changes involving the ileocecal valve. Mild fat density wall thickening involving the right colon. Minimal right pericolonic soft tissue stranding. The remainder of the colon is unremarkable. Unremarkable stomach and small bowel. No evidence of appendicitis. Vascular/Lymphatic: No significant vascular findings are present. No enlarged abdominal or pelvic lymph nodes. Reproductive: Status post hysterectomy. No adnexal masses. Other: Small amount of free peritoneal fluid in the pelvis. Musculoskeletal: Mild dextroconvex lumbar scoliosis. Otherwise, unremarkable bones. IMPRESSION: 1. Mild changes of previous colitis involving the right colon with probable minimal acute right colon colitis. 2. Marked diffuse hepatic steatosis. 3. Small amount of free peritoneal fluid in the pelvis. Electronically Signed   By: Claudie Revering M.D.   On: 01/15/2020 19:37   DG Chest Portable 1 View  Result Date: 01/15/2020 CLINICAL DATA:  Chest pain EXAM: PORTABLE CHEST 1 VIEW COMPARISON:  None. FINDINGS: The heart size and mediastinal contours are within normal limits. Both lungs are clear. The visualized skeletal structures are unremarkable. IMPRESSION: No active disease. Electronically Signed   By: Prudencio Pair M.D.   On: 01/15/2020 18:13   ECHOCARDIOGRAM COMPLETE  Result Date: 01/16/2020    ECHOCARDIOGRAM  REPORT   Patient Name:   Annette Lucas Date of Exam: 01/16/2020 Medical Rec #:  [id number]     Height:       64.0 in Accession #:    [id number]    Weight:       144.4 lb Date of Birth:  Jun 12, 1971     BSA:          1.703 m Patient Age:    41 years      BP:  115/76 mmHg Patient Gender: F             HR:           86 bpm. Exam Location:  [facility] Procedure: 2D Echo Indications:    Chest Pain 786.50 / R07.9  History:        Patient has no prior history of Echocardiogram examinations.                 Signs/Symptoms:Chest Pain; Risk Factors:Non-Smoker. GERD, ETOH.  Sonographer:    Leavy Cella RDCS (AE) Referring Phys: [phone] Derrious Bologna IMPRESSIONS  1. Left ventricular ejection fraction, by estimation, is 60 to 65%. The left ventricle has normal function. The left ventricle has no regional wall motion abnormalities. Left ventricular diastolic parameters were normal.  2. Right ventricular systolic function is normal. The right ventricular size is normal.  3. The mitral valve is normal in structure. Trivial mitral valve regurgitation.  4. The aortic valve is tricuspid. Aortic valve regurgitation is not visualized. No aortic stenosis is present. FINDINGS  Left Ventricle: Left ventricular ejection fraction, by estimation, is 60 to 65%. The left ventricle has normal function. The left ventricle has no regional wall motion abnormalities. The left ventricular internal cavity size was normal in size. There is  no left ventricular hypertrophy. Left ventricular diastolic parameters were normal. Right Ventricle: The right ventricular size is normal. No increase in right ventricular wall thickness. Right ventricular systolic function is normal. Left Atrium: Left atrial size was normal in size. Right Atrium: Right atrial size was normal in size. Pericardium: There is no evidence of pericardial effusion. Mitral Valve: The mitral valve is normal in structure. Trivial mitral valve regurgitation. Tricuspid Valve: The tricuspid  valve is grossly normal. Tricuspid valve regurgitation is not demonstrated. Aortic Valve: The aortic valve is tricuspid. Aortic valve regurgitation is not visualized. No aortic stenosis is present. Pulmonic Valve: The pulmonic valve was grossly normal. Pulmonic valve regurgitation is not visualized. Aorta: The aortic root is normal in size and structure. Venous: The inferior vena cava was not well visualized. IAS/Shunts: The interatrial septum was not well visualized.  LEFT VENTRICLE PLAX 2D LVIDd:         4.06 cm  Diastology LVIDs:         2.44 cm  LV e' lateral:   8.16 cm/s LV PW:         1.06 cm  LV E/e' lateral: 10.1 LV IVS:        0.73 cm  LV e' medial:    7.18 cm/s LVOT diam:     1.70 cm  LV E/e' medial:  11.5 LVOT Area:     2.27 cm  RIGHT VENTRICLE RV S prime:     14.30 cm/s TAPSE (M-mode): 1.6 cm LEFT ATRIUM             Index LA diam:        3.60 cm 2.11 cm/m LA Vol (A2C):   35.9 ml 21.07 ml/m LA Vol (A4C):   37.6 ml 22.07 ml/m LA Biplane Vol: 37.2 ml 21.84 ml/m   AORTA Ao Root diam: 2.30 cm MITRAL VALVE MV Area (PHT): 2.74 cm    SHUNTS MV Decel Time: 277 msec    Systemic Diam: 1.70 cm MV E velocity: 82.70 cm/s MV A velocity: 69.00 cm/s MV E/A ratio:  1.20 Kate Sable MD Electronically signed by Kate Sable MD Signature Date/Time: 01/16/2020/11:29:57 AM    Final      Microbiology: Recent  Results (from the past 240 hour(s))  SARS CORONAVIRUS 2 (Ahmadou Bolz 6-24 HRS) Nasopharyngeal Nasopharyngeal Swab     Status: None   Collection Time: 01/15/20  8:31 PM   Specimen: Nasopharyngeal Swab  Result Value Ref Range Status   SARS Coronavirus 2 NEGATIVE NEGATIVE Final    Comment: (NOTE) SARS-CoV-2 target nucleic acids are NOT DETECTED. The SARS-CoV-2 RNA is generally detectable in upper and lower respiratory specimens during the acute phase of infection. Negative results do not preclude SARS-CoV-2 infection, do not rule out co-infections with other pathogens, and should not be used as  the sole basis for treatment or other patient management decisions. Negative results must be combined with clinical observations, patient history, and epidemiological information. The expected result is Negative. Fact Sheet for Patients: SugarRoll.be Fact Sheet for Healthcare Providers: https://www.woods-mathews.com/ This test is not yet approved or cleared by the [place] FDA and  has been authorized for detection and/or diagnosis of SARS-CoV-2 by FDA under an Emergency Use Authorization (EUA). This EUA will remain  in effect (meaning this test can be used) for the duration of the COVID-19 declaration under Section 56 4(b)(1) of the Act, 21 U.S.C. section 360bbb-3(b)(1), unless the authorization is terminated or revoked sooner. Performed at [facility], [place] [address], [place], [place] [address]      Labs: Basic Metabolic Panel: Recent Labs  Lab 01/15/20 1722 01/15/20 1722 01/16/20 0418 01/17/20 0444  NA 134*  --  135 137  K 2.3*   < > 2.8* 3.3*  CL 94*  --  98 105  CO2 25  --  27 24  GLUCOSE 104*  --  117* 102*  BUN 14  --  11 9  CREATININE 0.57  --  0.49 0.39*  CALCIUM 8.0*  --  7.8* 8.1*  MG 1.5*  --  2.3 1.8  PHOS 1.4*  --   --   --    < > = values in this interval not displayed.   Liver Function Tests: Recent Labs  Lab 01/15/20 1722 01/16/20 0418 01/17/20 0444  AST 76* 52* 61*  ALT 43 34 38  ALKPHOS 214* 177* 192*  BILITOT 2.2* 1.4* 1.3*  PROT 6.1* 5.3* 5.5*  ALBUMIN 2.2* 1.9* 1.9*   Recent Labs  Lab 01/15/20 1722  LIPASE 17   No results for input(s): AMMONIA in the last 168 hours. CBC: Recent Labs  Lab 01/15/20 1722 01/16/20 0418  WBC 12.4* 8.0  NEUTROABS 9.7*  --   HGB 10.0* 8.7*  HCT 30.0* 26.8*  26.4*  MCV 112.8* 116.0*  PLT 282 235   Cardiac Enzymes: No results for input(s): CKTOTAL, CKMB, CKMBINDEX, TROPONINI in the last 168 hours. BNP: Invalid input(s): POCBNP CBG: No results  for input(s): GLUCAP in the last 168 hours.  Time coordinating discharge:  36 minutes  Signed:  Orson Eva, DO [facility] Pager: [phone] 01/17/2020, 5:06 PM

## 2020-01-18 DIAGNOSIS — E876 Hypokalemia: Secondary | ICD-10-CM

## 2020-01-18 LAB — COMPREHENSIVE METABOLIC PANEL
ALT: 37 U/L (ref 0–44)
AST: 52 U/L — ABNORMAL HIGH (ref 15–41)
Albumin: 1.9 g/dL — ABNORMAL LOW (ref 3.5–5.0)
Alkaline Phosphatase: 198 U/L — ABNORMAL HIGH (ref 38–126)
Anion gap: 8 (ref 5–15)
BUN: 9 mg/dL (ref 6–20)
CO2: 22 mmol/L (ref 22–32)
Calcium: 8.3 mg/dL — ABNORMAL LOW (ref 8.9–10.3)
Chloride: 107 mmol/L (ref 98–111)
Creatinine, Ser: 0.37 mg/dL — ABNORMAL LOW (ref 0.44–1.00)
GFR calc Af Amer: >60 mL/min (ref >60)
GFR calc non Af Amer: >60 mL/min (ref >60)
Glucose, Bld: 91 mg/dL (ref 70–99)
Potassium: 3.4 mmol/L — ABNORMAL LOW (ref 3.5–5.1)
Sodium: 137 mmol/L (ref 135–145)
Total Bilirubin: 1 mg/dL (ref 0.3–1.2)
Total Protein: 5.5 g/dL — ABNORMAL LOW (ref 6.5–8.1)

## 2020-01-18 LAB — CBC
HCT: 29.6 % — ABNORMAL LOW (ref 36.0–46.0)
Hemoglobin: 9.2 g/dL — ABNORMAL LOW (ref 12.0–15.0)
MCH: 37.2 pg — ABNORMAL HIGH (ref 26.0–34.0)
MCHC: 31.1 g/dL (ref 30.0–36.0)
MCV: 119.8 fL — ABNORMAL HIGH (ref 80.0–100.0)
Platelets: 291 10*3/uL (ref 150–400)
RBC: 2.47 MIL/uL — ABNORMAL LOW (ref 3.87–5.11)
RDW: 19.3 % — ABNORMAL HIGH (ref 11.5–15.5)
WBC: 6.6 10*3/uL (ref 4.0–10.5)
nRBC: 0 % (ref 0.0–0.2)

## 2020-01-18 LAB — MAGNESIUM: Magnesium: 1.9 mg/dL (ref 1.7–2.4)

## 2020-01-18 MED ORDER — VITAMIN B-12 1000 MCG PO TABS
500.0000 ug | ORAL_TABLET | Freq: Every day | ORAL | Status: DC
  Administered 2020-01-18 – 2020-01-19 (×2): 500 ug via ORAL
  Filled 2020-01-18 (×2): qty 1

## 2020-01-18 MED ORDER — PANTOPRAZOLE SODIUM 40 MG PO TBEC
40.0000 mg | DELAYED_RELEASE_TABLET | Freq: Two times a day (BID) | ORAL | Status: DC
  Administered 2020-01-19: 08:00:00 40 mg via ORAL
  Filled 2020-01-18: qty 1

## 2020-01-18 MED ORDER — POTASSIUM CHLORIDE CRYS ER 20 MEQ PO TBCR
30.0000 meq | EXTENDED_RELEASE_TABLET | Freq: Once | ORAL | Status: AC
  Administered 2020-01-18: 18:00:00 30 meq via ORAL
  Filled 2020-01-18: qty 1

## 2020-01-18 NOTE — TOC Initial Note (Signed)
Transition of Care [facility]) - Initial/Assessment Note    Patient Details  Name: Annette Lucas MRN: [id number] Date of Birth: [birth year]  [facility]) CM/SW Contact:    Almadelia Looman, Chauncey Reading, RN Phone Number: 01/18/2020, 3:06 PM  Clinical Narrative:        Patient recommended for SNF vs supervision. Discussed with PT, patient will be safe to go home with help/supervision.   Patient plans to go stay with her parents for support at time of DC. Her mother has a rolling Jipson she can use and is checking to see if she has a BSC available also.     Outpatient Substance abuse provided to patient. She is agreeable to review resources but unsure if she is interested in any help at this time.    Expected Discharge Plan: Home/Self Care Barriers to Discharge: Continued Medical Work up   Patient Goals and CMS Choice        Expected Discharge Plan and Services Expected Discharge Plan: Home/Self Care   Discharge Planning Services: CM Consult                                          Prior Living Arrangements/Services   Lives with:: Self                   Activities of Daily Living Home Assistive Devices/Equipment: None ADL Screening (condition at time of admission) Patient's cognitive ability adequate to safely complete daily activities?: Yes Is the patient deaf or have difficulty hearing?: No Does the patient have difficulty seeing, even when wearing glasses/contacts?: No Does the patient have difficulty concentrating, remembering, or making decisions?: No Patient able to express need for assistance with ADLs?: Yes Does the patient have difficulty dressing or bathing?: No Independently performs ADLs?: Yes (appropriate for developmental age) Does the patient have difficulty walking or climbing stairs?: No Weakness of Legs: None Weakness of Arms/Hands: None  Permission Sought/Granted                  Emotional Assessment               Admission diagnosis:  Hypokalemia [E87.6] Gastrointestinal hemorrhage, unspecified gastrointestinal hemorrhage type [K92.2] Anemia, unspecified type [D64.9] Pyelonephritis [N12] Patient Active Problem List   Diagnosis Date Noted  . Abnormal LFTs   . Acute pyelonephritis 01/16/2020  . Acute colitis 01/16/2020  . Heme positive stool 01/16/2020  . Pyelonephritis 01/16/2020  . Hypokalemia 01/15/2020  . Anemia 01/15/2020   PCP:  Rosalee Kaufman, PA-C Pharmacy:   [id number] - [place], [place] [address] [place] [address] Phone: [phone] Fax: [phone]     Social Determinants of Health (SDOH) Interventions    Readmission Risk Interventions No flowsheet data found.

## 2020-01-18 NOTE — Progress Notes (Signed)
PROGRESS NOTE  Annette Lucas [id number] DOB: December 15, 1970 DOA: 01/15/2020 PCP: Rosalee Kaufman, PA-C   Brief History: 49 year old female with a history of alcohol abuse, irritable bowel syndrome presenting with headache, nausea, vomiting, and loose stools that began on 01/13/2020. The patient had denied any fevers, chills, hematochezia, melena, dysuria, hematuria. She did have some diffuse abdominal pain. After she began vomiting she developed chest pain that was substernal in nature. She denied any shortness of breath, coughing, hemoptysis. The patient has also been complaining of generalized weakness with tingling in her bilateral hands. She denies any focal extremity weakness, dysarthria, dysphagia, focal extremity weakness, facial droop. She continues to drink approximately 2 glasses of wine daily. She has been drinking daily for the past 5 years. She denies any other alcohol or illicit drug use. Since admission, the patient states that her emesis and loose stools have improved as well as her abdominal pain. But she continues to have generalized weakness and anorexia. CT of the abdomen and pelvis showed mild fat density wall thickening of the right colon with a minimal pericolonic soft tissue stranding. There was marked hepatic steatosis with a small amount of free peritoneal fluid. GI was consulted to assist with management.  Assessment/Plan: Pyelonephritis -UA>50 WBC -follow urine culture -followvanc/aztreonam due to multiple abx allergies -This has contributed to the patient's nausea and vomiting  Colonic wall thickening/colitis/FOBT positive -GI consult appreciated-->outpatient TCS -01/15/2020 CT abdomen/pelvis as discussed above -Continuevanc/aztreoand metronidazole -Clear liquid diet>>advanced to soft diet -Continue IV fluids  Alcohol abuse -This is also contributed to her vomiting. -Suspect the patient has a degree of alcoholic gastritis -Alcohol withdrawal  protocol -Continue Protonix -PMP Aware queried-->no Rx for controlled substances x 3 years  Transaminasemia -hep B surface antigen--neg -hep C antibody--neg -Likely due to alcohol and hepatic steatosis -CT as discussed above  Atypical chest pain -Partly reproducible -Cycle troponins--neg x 4 -Personally reviewed EKG--sinus rhythm, nonspecific ST-T wave changes -Echo--EF 60-65%, no WMA  Hypomagnesemia/hypokalemia -Repleted  Alcohol Polyneuropathy -B12--226-->replete -check folate 3.3-->replete      Disposition Plan: Patient From: Home D/C Place: Home - 4/24 Barriers: Not Clinically Stable--multiple abx allergies.  Await final cultures  Family Communication:mother updated at bedside 4/23  Consultants:GI  Code Status: FULL  DVT Prophylaxis: SCDs   Procedures: As Listed in Progress Note Above  Antibiotics: vanco 4/21>>> Aztreonam 4/21>>> Metronidazole 4/21>>>      Subjective: Pt states abd pain is improving.  Denies f/c, cp, n/v/d.  No dysuria.  No sob  Objective: Vitals:   01/17/20 1800 01/17/20 2044 01/18/20 0612 01/18/20 1324  BP: 125/90 122/88 (!) 135/97 122/85  Pulse: 94 95 (!) 103 92  Resp: 16 18 18 18   Temp: 98.2 F (36.8 C) 97.7 F (36.5 C) 97.8 F (36.6 C) 97.9 F (36.6 C)  TempSrc: Oral Oral Oral Oral  SpO2: 100% 98% 100% 100%  Weight:      Height:        Intake/Output Summary (Last 24 hours) at 01/18/2020 1653 Last data filed at 01/18/2020 1329 Gross per 24 hour  Intake 1380 ml  Output 475 ml  Net 905 ml   Weight change:  Exam:   General:  Pt is alert, follows commands appropriately, not in acute distress  HEENT: No icterus, No thrush, No neck mass, [place]/AT  Cardiovascular: RRR, S1/S2, no rubs, no gallops  Respiratory: CTA bilaterally, no wheezing, no crackles, no rhonchi  Abdomen: Soft/+BS, non tender, non distended, no  guarding  Extremities: No edema, No lymphangitis, No petechiae, No rashes,  no synovitis   Data Reviewed: I have personally reviewed following labs and imaging studies Basic Metabolic Panel: Recent Labs  Lab 01/15/20 1722 01/16/20 0418 01/17/20 0444 01/18/20 0430  NA 134* 135 137 137  K 2.3* 2.8* 3.3* 3.4*  CL 94* 98 105 107  CO2 25 27 24 22   GLUCOSE 104* 117* 102* 91  BUN 14 11 9 9   CREATININE 0.57 0.49 0.39* 0.37*  CALCIUM 8.0* 7.8* 8.1* 8.3*  MG 1.5* 2.3 1.8 1.9  PHOS 1.4*  --   --   --    Liver Function Tests: Recent Labs  Lab 01/15/20 1722 01/16/20 0418 01/17/20 0444 01/18/20 0430  AST 76* 52* 61* 52*  ALT 43 34 38 37  ALKPHOS 214* 177* 192* 198*  BILITOT 2.2* 1.4* 1.3* 1.0  PROT 6.1* 5.3* 5.5* 5.5*  ALBUMIN 2.2* 1.9* 1.9* 1.9*   Recent Labs  Lab 01/15/20 1722  LIPASE 17   No results for input(s): AMMONIA in the last 168 hours. Coagulation Profile: Recent Labs  Lab 01/17/20 0444  INR 1.1   CBC: Recent Labs  Lab 01/15/20 1722 01/16/20 0418 01/18/20 0430  WBC 12.4* 8.0 6.6  NEUTROABS 9.7*  --   --   HGB 10.0* 8.7* 9.2*  HCT 30.0* 26.8*  26.4* 29.6*  MCV 112.8* 116.0* 119.8*  PLT 282 235 291   Cardiac Enzymes: No results for input(s): CKTOTAL, CKMB, CKMBINDEX, TROPONINI in the last 168 hours. BNP: Invalid input(s): POCBNP CBG: No results for input(s): GLUCAP in the last 168 hours. HbA1C: No results for input(s): HGBA1C in the last 72 hours. Urine analysis:    Component Value Date/Time   COLORURINE AMBER (A) 01/16/2020 1107   APPEARANCEUR HAZY (A) 01/16/2020 1107   LABSPEC 1.031 (H) 01/16/2020 1107   PHURINE 5.0 01/16/2020 1107   GLUCOSEU NEGATIVE 01/16/2020 1107   HGBUR NEGATIVE 01/16/2020 1107   BILIRUBINUR NEGATIVE 01/16/2020 1107   KETONESUR NEGATIVE 01/16/2020 1107   PROTEINUR NEGATIVE 01/16/2020 1107   NITRITE NEGATIVE 01/16/2020 1107   LEUKOCYTESUR SMALL (A) 01/16/2020 1107   Sepsis Labs: @LABRCNTIP (procalcitonin:4,lacticidven:4) ) Recent Results (from the past 240 hour(s))  SARS CORONAVIRUS 2  (Karine Garn 6-24 HRS) Nasopharyngeal Nasopharyngeal Swab     Status: None   Collection Time: 01/15/20  8:31 PM   Specimen: Nasopharyngeal Swab  Result Value Ref Range Status   SARS Coronavirus 2 NEGATIVE NEGATIVE Final    Comment: (NOTE) SARS-CoV-2 target nucleic acids are NOT DETECTED. The SARS-CoV-2 RNA is generally detectable in upper and lower respiratory specimens during the acute phase of infection. Negative results do not preclude SARS-CoV-2 infection, do not rule out co-infections with other pathogens, and should not be used as the sole basis for treatment or other patient management decisions. Negative results must be combined with clinical observations, patient history, and epidemiological information. The expected result is Negative. Fact Sheet for Patients: SugarRoll.be Fact Sheet for Healthcare Providers: https://www.woods-mathews.com/ This test is not yet approved or cleared by the [place] FDA and  has been authorized for detection and/or diagnosis of SARS-CoV-2 by FDA under an Emergency Use Authorization (EUA). This EUA will remain  in effect (meaning this test can be used) for the duration of the COVID-19 declaration under Section 56 4(b)(1) of the Act, 21 U.S.C. section 360bbb-3(b)(1), unless the authorization is terminated or revoked sooner. Performed at [facility], [place] [address], [place], [place] [address]   Culture, Urine  Status: None (Preliminary result)   Collection Time: 01/16/20 11:08 AM   Specimen: Urine, Clean Catch  Result Value Ref Range Status   Specimen Description   Final    URINE, CLEAN CATCH Performed at [facility], [address], [place], [place] [address]    Special Requests   Final    NONE Performed at [facility], [address], [place], [place] [address]    Culture   Final    CULTURE REINCUBATED FOR BETTER GROWTH Performed at [facility], [place] [address] [place], [place]  [address]    Report Status PENDING  Incomplete     Scheduled Meds: . folic acid  1 mg Oral Daily  . LORazepam  0-4 mg Intravenous Q12H  . multivitamin with minerals  1 tablet Oral Daily  . pantoprazole (PROTONIX) IV  40 mg Intravenous Q12H  . thiamine  100 mg Oral Daily   Or  . thiamine  100 mg Intravenous Daily  . vitamin B-12  500 mcg Oral Daily   Continuous Infusions: . sodium chloride Stopped (01/17/20 1558)  . aztreonam Stopped (01/18/20 1329)  . metronidazole Stopped (01/18/20 1210)  . vancomycin Stopped (01/18/20 1000)    Procedures/Studies: CT Abdomen Pelvis W Contrast  Result Date: 01/15/2020 CLINICAL DATA:  Nausea, vomiting and diarrhea for the past 2 days. Abdominal pain. EXAM: CT ABDOMEN AND PELVIS WITH CONTRAST TECHNIQUE: Multidetector CT imaging of the abdomen and pelvis was performed using the standard protocol following bolus administration of intravenous contrast. CONTRAST:  135mL OMNIPAQUE IOHEXOL 300 MG/ML  SOLN COMPARISON:  None. FINDINGS: Lower chest: Mild linear scarring at both lung bases. Hepatobiliary: Marked diffuse low density of the liver. Normal appearing gallbladder. Pancreas: Unremarkable. No pancreatic ductal dilatation or surrounding inflammatory changes. Spleen: Normal in size without focal abnormality. Adrenals/Urinary Tract: Small amount of air in the urinary bladder, compatible with recent catheterization. Normal appearing adrenal glands, kidneys and ureters. Stomach/Bowel: Lipomatous changes involving the ileocecal valve. Mild fat density wall thickening involving the right colon. Minimal right pericolonic soft tissue stranding. The remainder of the colon is unremarkable. Unremarkable stomach and small bowel. No evidence of appendicitis. Vascular/Lymphatic: No significant vascular findings are present. No enlarged abdominal or pelvic lymph nodes. Reproductive: Status post hysterectomy. No adnexal masses. Other: Small amount of free peritoneal fluid in the  pelvis. Musculoskeletal: Mild dextroconvex lumbar scoliosis. Otherwise, unremarkable bones. IMPRESSION: 1. Mild changes of previous colitis involving the right colon with probable minimal acute right colon colitis. 2. Marked diffuse hepatic steatosis. 3. Small amount of free peritoneal fluid in the pelvis. Electronically Signed   By: Claudie Revering M.D.   On: 01/15/2020 19:37   DG Chest Portable 1 View  Result Date: 01/15/2020 CLINICAL DATA:  Chest pain EXAM: PORTABLE CHEST 1 VIEW COMPARISON:  None. FINDINGS: The heart size and mediastinal contours are within normal limits. Both lungs are clear. The visualized skeletal structures are unremarkable. IMPRESSION: No active disease. Electronically Signed   By: Prudencio Pair M.D.   On: 01/15/2020 18:13   ECHOCARDIOGRAM COMPLETE  Result Date: 01/16/2020    ECHOCARDIOGRAM REPORT   Patient Name:   Annette Lucas Date of Exam: 01/16/2020 Medical Rec #:  [id number]     Height:       64.0 in Accession #:    [id number]    Weight:       144.4 lb Date of Birth:  1971/08/30     BSA:          1.703  m Patient Age:    89 years      BP:           115/76 mmHg Patient Gender: F             HR:           86 bpm. Exam Location:  [facility] Procedure: 2D Echo Indications:    Chest Pain 786.50 / R07.9  History:        Patient has no prior history of Echocardiogram examinations.                 Signs/Symptoms:Chest Pain; Risk Factors:Non-Smoker. GERD, ETOH.  Sonographer:    Leavy Cella RDCS (AE) Referring Phys: [phone] Goldy Calandra IMPRESSIONS  1. Left ventricular ejection fraction, by estimation, is 60 to 65%. The left ventricle has normal function. The left ventricle has no regional wall motion abnormalities. Left ventricular diastolic parameters were normal.  2. Right ventricular systolic function is normal. The right ventricular size is normal.  3. The mitral valve is normal in structure. Trivial mitral valve regurgitation.  4. The aortic valve is tricuspid. Aortic valve regurgitation is  not visualized. No aortic stenosis is present. FINDINGS  Left Ventricle: Left ventricular ejection fraction, by estimation, is 60 to 65%. The left ventricle has normal function. The left ventricle has no regional wall motion abnormalities. The left ventricular internal cavity size was normal in size. There is  no left ventricular hypertrophy. Left ventricular diastolic parameters were normal. Right Ventricle: The right ventricular size is normal. No increase in right ventricular wall thickness. Right ventricular systolic function is normal. Left Atrium: Left atrial size was normal in size. Right Atrium: Right atrial size was normal in size. Pericardium: There is no evidence of pericardial effusion. Mitral Valve: The mitral valve is normal in structure. Trivial mitral valve regurgitation. Tricuspid Valve: The tricuspid valve is grossly normal. Tricuspid valve regurgitation is not demonstrated. Aortic Valve: The aortic valve is tricuspid. Aortic valve regurgitation is not visualized. No aortic stenosis is present. Pulmonic Valve: The pulmonic valve was grossly normal. Pulmonic valve regurgitation is not visualized. Aorta: The aortic root is normal in size and structure. Venous: The inferior vena cava was not well visualized. IAS/Shunts: The interatrial septum was not well visualized.  LEFT VENTRICLE PLAX 2D LVIDd:         4.06 cm  Diastology LVIDs:         2.44 cm  LV e' lateral:   8.16 cm/s LV PW:         1.06 cm  LV E/e' lateral: 10.1 LV IVS:        0.73 cm  LV e' medial:    7.18 cm/s LVOT diam:     1.70 cm  LV E/e' medial:  11.5 LVOT Area:     2.27 cm  RIGHT VENTRICLE RV S prime:     14.30 cm/s TAPSE (M-mode): 1.6 cm LEFT ATRIUM             Index LA diam:        3.60 cm 2.11 cm/m LA Vol (A2C):   35.9 ml 21.07 ml/m LA Vol (A4C):   37.6 ml 22.07 ml/m LA Biplane Vol: 37.2 ml 21.84 ml/m   AORTA Ao Root diam: 2.30 cm MITRAL VALVE MV Area (PHT): 2.74 cm    SHUNTS MV Decel Time: 277 msec    Systemic Diam: 1.70 cm MV  E velocity: 82.70 cm/s MV A velocity: 69.00 cm/s MV E/A ratio:  1.20 Kate Sable MD Electronically signed by Kate Sable MD Signature Date/Time: 01/16/2020/11:29:57 AM    Final     Orson Eva, DO  [facility]  If 7PM-7AM, please contact night-coverage [email or web address] Password [email or web address] 01/18/2020, 4:53 PM   LOS: 2 days

## 2020-01-18 NOTE — Progress Notes (Signed)
Subjective: Feeling ok from GI perspective today. Main concern is back and shoulder pain that she feels is from the bed. Abdominal pain continues to improve. No BM in last 24 hours. Given no stool in several days, doubt CDiff. No N/V. No other GI complaints. Tolerated diet well last night. Hoping to go home soon.  Objective: Vital signs in last 24 hours: Temp:  [97.7 F (36.5 C)-98.2 F (36.8 C)] 97.8 F (36.6 C) (04/23 0612) Pulse Rate:  [90-103] 103 (04/23 0612) Resp:  [16-18] 18 (04/23 0612) BP: (122-135)/(88-97) 135/97 (04/23 0612) SpO2:  [98 %-100 %] 100 % (04/23 0612) Last BM Date: 01/16/20 General:   Alert and oriented, pleasant Eyes:  No icterus, sclera clear. Conjuctiva pink.  Heart:  S1, S2 present, no murmurs noted.  Lungs: Clear to auscultation bilaterally, without wheezing, rales, or rhonchi.  Abdomen:  Bowel sounds present, soft, non-tender, non-distended. No HSM or hernias noted. No rebound or guarding. No masses appreciated  Msk:  Symmetrical without gross deformities. Pulses:  Normal bilateral DP pulses noted. Extremities:  Without clubbing or edema. Neurologic:  Alert and  oriented x4;  grossly normal neurologically. Skin:  Warm and dry, intact without significant lesions.  Psych:  Alert and cooperative. Normal mood and affect.  Intake/Output from previous day: 04/22 0701 - 04/23 0700 In: 957.6 [I.V.:57.6; IV Piggyback:900] Out: 775 [Urine:775] Intake/Output this shift: No intake/output data recorded.  Lab Results: Recent Labs    01/15/20 1722 01/16/20 0418 01/18/20 0430  WBC 12.4* 8.0 6.6  HGB 10.0* 8.7* 9.2*  HCT 30.0* 26.8*  26.4* 29.6*  PLT 282 235 291   BMET Recent Labs    01/16/20 0418 01/17/20 0444 01/18/20 0430  NA 135 137 137  K 2.8* 3.3* 3.4*  CL 98 105 107  CO2 27 24 22   GLUCOSE 117* 102* 91  BUN 11 9 9   CREATININE 0.49 0.39* 0.37*  CALCIUM 7.8* 8.1* 8.3*   LFT Recent Labs    01/16/20 0418 01/17/20 0444 01/18/20 0430   PROT 5.3* 5.5* 5.5*  ALBUMIN 1.9* 1.9* 1.9*  AST 52* 61* 52*  ALT 34 38 37  ALKPHOS 177* 192* 198*  BILITOT 1.4* 1.3* 1.0   PT/INR Recent Labs    01/17/20 0444  LABPROT 13.8  INR 1.1   Hepatitis Panel Recent Labs    01/16/20 1016  HEPBSAG NON REACTIVE  HCVAB NON REACTIVE     Studies/Results: ECHOCARDIOGRAM COMPLETE  Result Date: 01/16/2020    ECHOCARDIOGRAM REPORT   Patient Name:   Annette Lucas Date of Exam: 01/16/2020 Medical Rec #:  [id number]     Height:       64.0 in Accession #:    [id number]    Weight:       144.4 lb Date of Birth:  03-07-1971     BSA:          1.703 m Patient Age:    49 years      BP:           115/76 mmHg Patient Gender: F             HR:           86 bpm. [facility]am Location:  [facility] Procedure: 2D Echo Indications:    Chest Pain 786.50 / R07.9  History:        Patient has no prior history of Echocardiogram examinations.  Signs/Symptoms:Chest Pain; Risk Factors:Non-Smoker. GERD, ETOH.  Sonographer:    Leavy Cella RDCS (AE) Referring Phys: [phone] DAVID TAT IMPRESSIONS  1. Left ventricular ejection fraction, by estimation, is 60 to 65%. The left ventricle has normal function. The left ventricle has no regional wall motion abnormalities. Left ventricular diastolic parameters were normal.  2. Right ventricular systolic function is normal. The right ventricular size is normal.  3. The mitral valve is normal in structure. Trivial mitral valve regurgitation.  4. The aortic valve is tricuspid. Aortic valve regurgitation is not visualized. No aortic stenosis is present. FINDINGS  Left Ventricle: Left ventricular ejection fraction, by estimation, is 60 to 65%. The left ventricle has normal function. The left ventricle has no regional wall motion abnormalities. The left ventricular internal cavity size was normal in size. There is  no left ventricular hypertrophy. Left ventricular diastolic parameters were normal. Right Ventricle: The right ventricular size is  normal. No increase in right ventricular wall thickness. Right ventricular systolic function is normal. Left Atrium: Left atrial size was normal in size. Right Atrium: Right atrial size was normal in size. Pericardium: There is no evidence of pericardial effusion. Mitral Valve: The mitral valve is normal in structure. Trivial mitral valve regurgitation. Tricuspid Valve: The tricuspid valve is grossly normal. Tricuspid valve regurgitation is not demonstrated. Aortic Valve: The aortic valve is tricuspid. Aortic valve regurgitation is not visualized. No aortic stenosis is present. Pulmonic Valve: The pulmonic valve was grossly normal. Pulmonic valve regurgitation is not visualized. Aorta: The aortic root is normal in size and structure. Venous: The inferior vena cava was not well visualized. IAS/Shunts: The interatrial septum was not well visualized.  LEFT VENTRICLE PLAX 2D LVIDd:         4.06 cm  Diastology LVIDs:         2.44 cm  LV e' lateral:   8.16 cm/s LV PW:         1.06 cm  LV E/e' lateral: 10.1 LV IVS:        0.73 cm  LV e' medial:    7.18 cm/s LVOT diam:     1.70 cm  LV E/e' medial:  11.5 LVOT Area:     2.27 cm  RIGHT VENTRICLE RV S prime:     14.30 cm/s TAPSE (M-mode): 1.6 cm LEFT ATRIUM             Index LA diam:        3.60 cm 2.11 cm/m LA Vol (A2C):   35.9 ml 21.07 ml/m LA Vol (A4C):   37.6 ml 22.07 ml/m LA Biplane Vol: 37.2 ml 21.84 ml/m   AORTA Ao Root diam: 2.30 cm MITRAL VALVE MV Area (PHT): 2.74 cm    SHUNTS MV Decel Time: 277 msec    Systemic Diam: 1.70 cm MV E velocity: 82.70 cm/s MV A velocity: 69.00 cm/s MV E/A ratio:  1.20 Kate Sable MD Electronically signed by Kate Sable MD Signature Date/Time: 01/16/2020/11:29:57 AM    Final     Assessment: 49 year old female with history of alcohol abuse, IBS presenting with 4-day history of acute onset nausea, vomiting, nonbloody diarrhea associated with abdominal pain.  She developed substernal chest pain after several episodes of  vomiting.  Felt to be noncardiac.  Colitis: Involving primarily the proximal colon on CT. Improving with symptomatic therapy.  Stool studies not yet collected.  Suspect infectious etiology but cannot exclude IBD. Patient is currently on Vanc/aztreonam IV  Macrocytic anemia: Folate level is low. No evidence  of iron deficiency. Hgb on admission 10.0 and down to 8.7 Wednesday but now 9.2 today. No overt GI bleeding. She is heme positive. Remote EGD. No prior colonoscopy.  Abnormal LFTs: Likely alcoholic hepatitis. DF 10. Marked hepatic steatosis noted on CT.  Numbers improving with AST/ALT 52/37, alk phos stable at 198, bili improved at 1.0). Iron sats 92%, ferritin 326 possible due to etoh abuse. Hepatitis B surface antigen and HCV Ab both negative.    Plan: 1. Complete antibiotics 2. ETOH cessation 3. Outpatient follow-up with Dr. Laural Golden to consider +/- EGD 4. Trend liver labs as outpatient 5. Anticipate d/c in the next 24-48 hours.   Thank you for allowing [place] to participate in the care of Annette Kaufmann, DNP, AGNP-C Adult & Gerontological Nurse Practitioner [facility]    LOS: 2 days    01/18/2020, 8:50 AM

## 2020-01-18 NOTE — Progress Notes (Signed)
Physical Therapy Treatment Patient Details Name: Annette Lucas MRN: [id number] DOB: January 11, 1971 Today's Date: 01/18/2020    History of Present Illness 49 year old female with a history of alcohol abuse, irritable bowel syndrome presenting with headache, nausea, vomiting, and loose stools that began on 01/13/2020. The patient had denied any fevers, chills, hematochezia, melena, dysuria, hematuria. She did have some diffuse abdominal pain. After she began vomiting she developed chest pain that was substernal in nature. She denied any shortness of breath, coughing, hemoptysis. The patient has also been complaining of generalized weakness with tingling in her bilateral hands. She denies any focal extremity weakness, dysarthria, dysphagia, focal extremity weakness, facial droop. She continues to drink approximately 2 glasses of wine daily. She has been drinking daily for the past 5 years.    PT Comments    Patient able to transition to seated EOB with HOB elevated without physical assist. Upon seated, she demonstrates good sitting tolerance and completes exercises in sitting. She demonstrates ataxic movements with exercises and requires UE support for balance while completing. She is fearful to transfer to chair due to fear of falling. She requires physical assist for transfer to standing with RW and verbal cueing for RW use. She has shakiness and unsteadiness upon standing and she did not wish to ambulate further distance than to the chair at bedside. Tolerated sitting in chair with family present. Patient will benefit from continued physical therapy in hospital and recommended venue below to increase strength, balance, endurance for safe ADLs and gait.    Follow Up Recommendations  SNF;Supervision/Assistance - 24 hour(vs home with max HH support)     Equipment Recommendations  Rolling [place] with 5" wheels;3in1 (PT)    Recommendations for Other Services       Precautions / Restrictions  Precautions Precautions: Fall Restrictions Weight Bearing Restrictions: No    Mobility  Bed Mobility Overal bed mobility: Needs Assistance Bed Mobility: Supine to Sit     Supine to sit: HOB elevated;Supervision     General bed mobility comments: increased time, use of bedrail and elevated HOB to achieve upsitting sitting at EOB  Transfers Overall transfer level: Needs assistance Equipment used: Rolling Lun (2 wheeled) Transfers: Sit to/from [facility] Sit to Stand: Min assist Stand pivot transfers: Mod assist       General transfer comment: cues for hand placement, min assist to power up, increased unsteadiness and shakiness upon standing requiring increased assistance to maintain balance, verbal cueing for LE movement and RW navigation  Ambulation/Gait Ambulation/Gait assistance: Mod assist Gait Distance (Feet): 2 Feet Assistive device: Rolling [place] (2 wheeled) Gait Pattern/deviations: Step-to pattern;Decreased stride length Gait velocity: decreased   General Gait Details: ataxic-like gait due to increased full body shakiness upon standing, verbal cueing for LE movement, limited by weakness and fear of falling   Stairs             Wheelchair Mobility    Modified Rankin (Stroke Patients Only)       Balance Overall balance assessment: Needs assistance Sitting-balance support: Feet supported;No upper extremity supported Sitting balance-Leahy Scale: Fair Sitting balance - Comments: seated EOB   Standing balance support: During functional activity;Bilateral upper extremity supported Standing balance-Leahy Scale: Poor Standing balance comment: with RW                            Cognition Arousal/Alertness: Awake/alert Behavior During Therapy: WFL for tasks assessed/performed Overall Cognitive Status: Within Functional Limits for tasks  assessed                                        Exercises General  Exercises - Lower Extremity Long Arc Quad: AROM;Both;Seated;15 reps Hip Flexion/Marching: AROM;Both;15 reps;Seated Toe Raises: AROM;Both;15 reps;Seated Heel Raises: AROM;Both;15 reps;Seated    General Comments        Pertinent Vitals/Pain Pain Assessment: Faces Faces Pain Scale: Hurts little more Pain Location: back and bilateral shoulders Pain Descriptors / Indicators: Grimacing Pain Intervention(s): Limited activity within patient's tolerance;Monitored during session;Repositioned    Home Living                      Prior Function            PT Goals (current goals can now be found in the care plan section) Acute Rehab PT Goals Patient Stated Goal: return home or with parents temporarily PT Goal Formulation: With patient/family Time For Goal Achievement: 01/31/20 Potential to Achieve Goals: Good Progress towards PT goals: Progressing toward goals    Frequency    Min 2X/week      PT Plan Current plan remains appropriate    Co-evaluation              AM-PAC PT "6 Clicks" Mobility   Outcome Measure  Help needed turning from your back to your side while in a flat bed without using bedrails?: A Little Help needed moving from lying on your back to sitting on the side of a flat bed without using bedrails?: A Little Help needed moving to and from a bed to a chair (including a wheelchair)?: A Lot Help needed standing up from a chair using your arms (e.g., wheelchair or bedside chair)?: A Little Help needed to walk in hospital room?: A Lot Help needed climbing 3-5 steps with a railing? : Total 6 Click Score: 14    End of Session Equipment Utilized During Treatment: Gait belt Activity Tolerance: Patient limited by fatigue Patient left: in chair;with call bell/phone within reach;with family/visitor present Nurse Communication: Mobility status PT Visit Diagnosis: Unsteadiness on feet (R26.81);Other abnormalities of gait and mobility (R26.89);Muscle  weakness (generalized) (M62.81)     Time: [id number] PT Time Calculation (min) (ACUTE ONLY): 18 min  Charges:  $Therapeutic Activity: 8-22 mins                     11:13 AM, 01/18/20 Mearl Latin PT, DPT Physical Therapist at [facility]

## 2020-01-19 LAB — BASIC METABOLIC PANEL
Anion gap: 8 (ref 5–15)
BUN: 10 mg/dL (ref 6–20)
CO2: 21 mmol/L — ABNORMAL LOW (ref 22–32)
Calcium: 8.3 mg/dL — ABNORMAL LOW (ref 8.9–10.3)
Chloride: 105 mmol/L (ref 98–111)
Creatinine, Ser: 0.35 mg/dL — ABNORMAL LOW (ref 0.44–1.00)
GFR calc Af Amer: >60 mL/min (ref >60)
GFR calc non Af Amer: >60 mL/min (ref >60)
Glucose, Bld: 101 mg/dL — ABNORMAL HIGH (ref 70–99)
Potassium: 3.6 mmol/L (ref 3.5–5.1)
Sodium: 134 mmol/L — ABNORMAL LOW (ref 135–145)

## 2020-01-19 LAB — MAGNESIUM: Magnesium: 1.7 mg/dL (ref 1.7–2.4)

## 2020-01-19 MED ORDER — MAGNESIUM SULFATE 2 GM/50ML IV SOLN
2.0000 g | Freq: Once | INTRAVENOUS | Status: AC
  Administered 2020-01-19: 2 g via INTRAVENOUS
  Filled 2020-01-19: qty 50

## 2020-01-19 MED ORDER — CYANOCOBALAMIN 500 MCG PO TABS: 500.0000 ug | ORAL_TABLET | Freq: Every day | ORAL | Status: DC

## 2020-01-19 MED ORDER — MAGNESIUM OXIDE 400 (241.3 MG) MG PO TABS: 400.0000 mg | ORAL_TABLET | Freq: Every day | ORAL | 0 refills | Status: DC

## 2020-01-19 MED ORDER — MAGNESIUM OXIDE 400 (241.3 MG) MG PO TABS
400.0000 mg | ORAL_TABLET | Freq: Every day | ORAL | Status: DC
  Administered 2020-01-19: 400 mg via ORAL
  Filled 2020-01-19: qty 1

## 2020-01-19 MED ORDER — METRONIDAZOLE 500 MG PO TABS: 500.0000 mg | ORAL_TABLET | Freq: Three times a day (TID) | ORAL | 0 refills | Status: DC

## 2020-01-19 MED ORDER — SULFAMETHOXAZOLE-TRIMETHOPRIM 800-160 MG PO TABS: 1.0000 | ORAL_TABLET | Freq: Two times a day (BID) | ORAL | Status: DC

## 2020-01-19 MED ORDER — METRONIDAZOLE 500 MG PO TABS: 500.0000 mg | ORAL_TABLET | Freq: Three times a day (TID) | ORAL | Status: DC

## 2020-01-19 MED ORDER — SULFAMETHOXAZOLE-TRIMETHOPRIM 800-160 MG PO TABS: 1.0000 | ORAL_TABLET | Freq: Two times a day (BID) | ORAL | 0 refills | Status: DC

## 2020-01-19 MED ORDER — FOSFOMYCIN TROMETHAMINE 3 G PO PACK
3.0000 g | PACK | Freq: Once | ORAL | Status: AC
  Administered 2020-01-19: 3 g via ORAL
  Filled 2020-01-19: qty 3

## 2020-01-19 MED ORDER — MAGNESIUM OXIDE 400 (241.3 MG) MG PO TABS: 400.0000 mg | ORAL_TABLET | Freq: Every day | ORAL | 0 refills | Status: AC

## 2020-01-19 NOTE — Discharge Instructions (Signed)

## 2020-01-19 NOTE — TOC Transition Note (Signed)
Transition of Care [facility]) - CM/SW Discharge Note  Patient Details  Name: Kellen Walkinshaw MRN: [id number] Date of Birth: July 07, 1971  Transition of [facility]) CM/SW Contact:  Sherie Don, LCSW Phone Number: 01/19/2020, 12:21 PM  Clinical Narrative: TOC notified of patient's need for [facility]. Patient is uninsured and charity [facility] provider for this week is [place]. CSW called Floydene Flock with [facility] to make referral for charity [facility]. [facility] accepted referral. Information provided to [facility] forfollow up with patient. Physician updated. TOC signing off.  Final next level of care: [place] Barriers to Discharge: Barriers Resolved  Discharge Plan and Services Discharge Planning Services: CM Consult HH Arranged: PT, Social Work [facility] Agency: [place] ([place]) Date [place]: 01/19/20 Time [place]: 1148 Representative spoke with at [place]: Floydene Flock  Readmission Risk Interventions No flowsheet data found.

## 2020-01-19 NOTE — Plan of Care (Signed)

## 2020-01-19 NOTE — Progress Notes (Signed)
I reviewed discharge instructions with patient and mother, they verbalized they understood. Discharge packet was given to patient and instructed patient to call if she had any questions.

## 2020-01-20 LAB — URINE CULTURE: Culture: >=100000 — AB

## 2020-02-11 ENCOUNTER — Telehealth: Payer: Self-pay | Admitting: Gastroenterology

## 2020-02-11 NOTE — Telephone Encounter (Signed)
Annette Lucas, patient needs hospital follow up for colitis, anemia, etoh hepatitis with Dr. Laural Golden. She was seen several weeks back when we were covering for him. Thanks.    This is from Neil Crouch.

## 2020-02-11 NOTE — Telephone Encounter (Signed)
Annette Lucas, patient needs hospital follow up for colitis, anemia, etoh hepatitis with Dr. Laural Golden. She was seen several weeks back when we were covering for him. Thanks.

## 2020-02-12 ENCOUNTER — Ambulatory Visit (INDEPENDENT_AMBULATORY_CARE_PROVIDER_SITE_OTHER): Payer: Self-pay | Admitting: Internal Medicine

## 2021-01-26 IMAGING — DX DG CHEST 1V PORT
1 series · 1 of 1 positions shown · non-contrast
Comparison: None.

CLINICAL DATA: Chest pain

EXAM:
PORTABLE CHEST 1 VIEW

[chest ap]
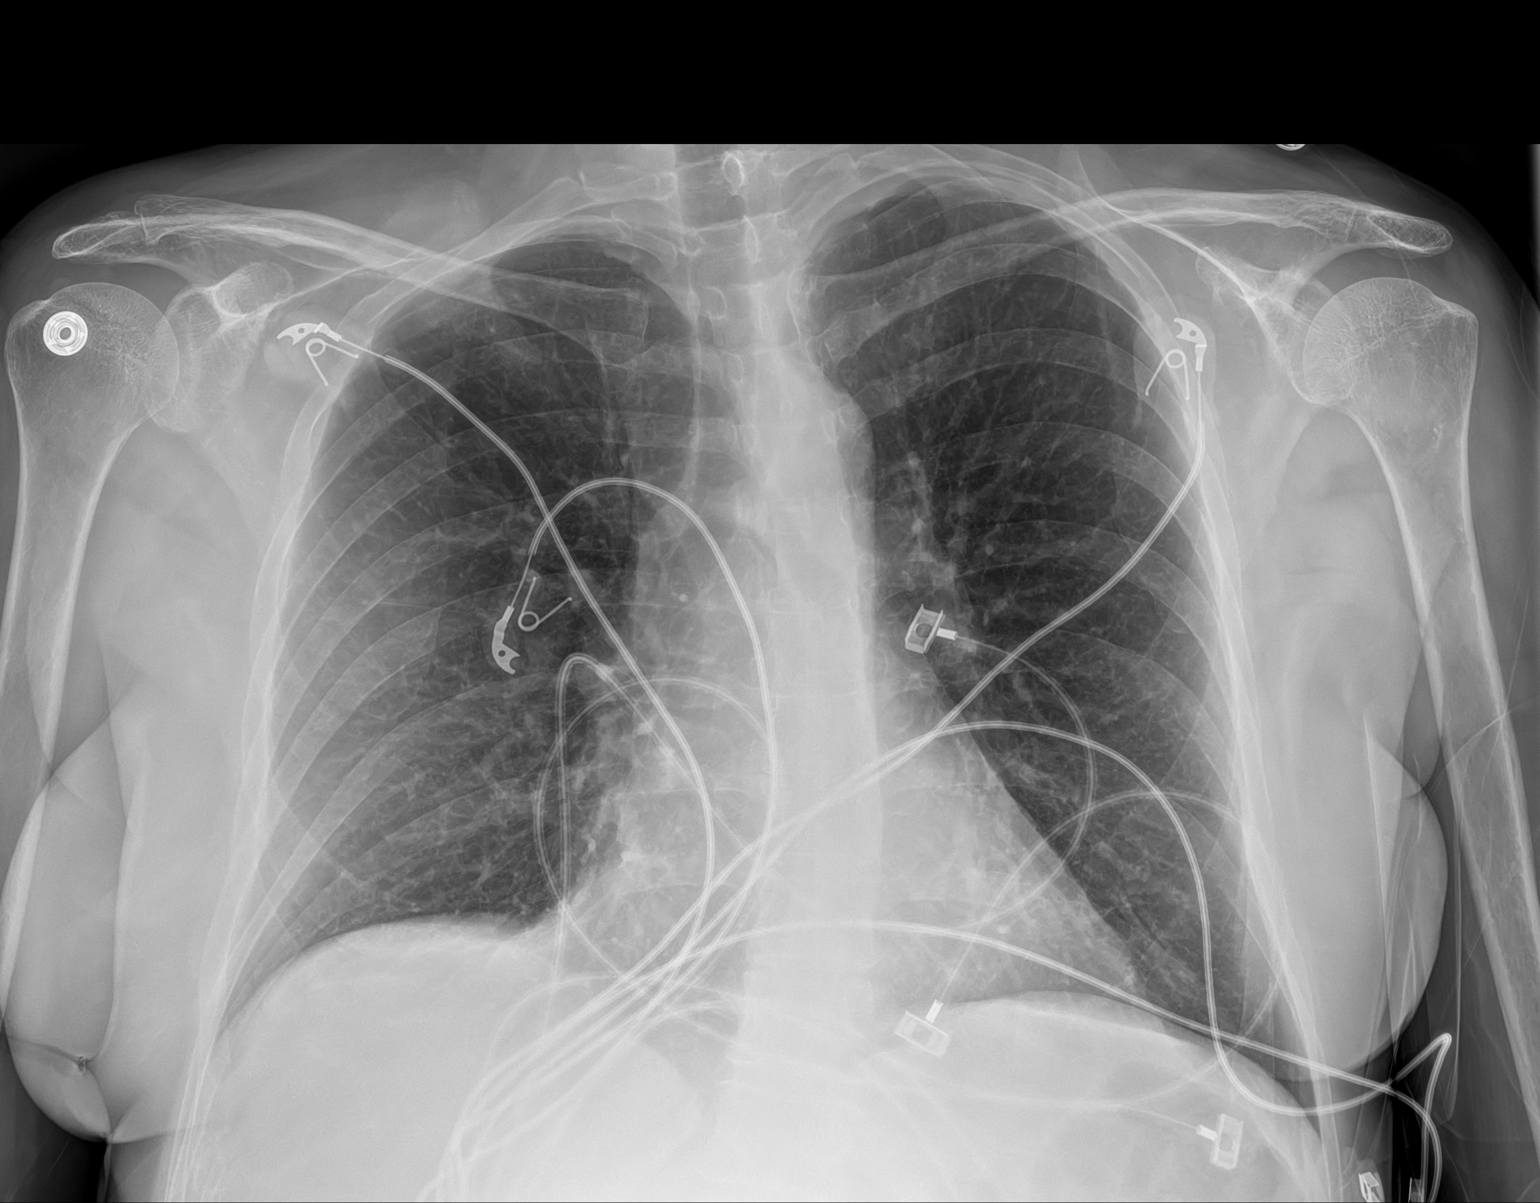

[1 of 1 positions shown; findings below may reference images not displayed]

FINDINGS: The heart size and mediastinal contours are within normal limits.
Both lungs are clear. The visualized skeletal structures are
unremarkable.
IMPRESSION: No active disease.

## 2021-01-26 IMAGING — CT CT ABD-PELV W/ CM
2 of 6 series · 15 of 46 positions shown, 17 images · IV contrast (Omnipaque or Isovue)
Comparison: None.

CLINICAL DATA: Nausea, vomiting and diarrhea for the past 2 days.
Abdominal pain.

EXAM:
CT ABDOMEN AND PELVIS WITH CONTRAST
TECHNIQUE: Multidetector CT imaging of the abdomen and pelvis was performed
using the standard protocol following bolus administration of
intravenous contrast.
CONTRAST:  100mL OMNIPAQUE IOHEXOL 300 MG/ML  SOLN

[Series 2: axial st · axial · 0.65mm/px · z∈[+566,+1031]mm · 12 of 107 slices shown, 14 images]
[im 7/107  soft-tissue]
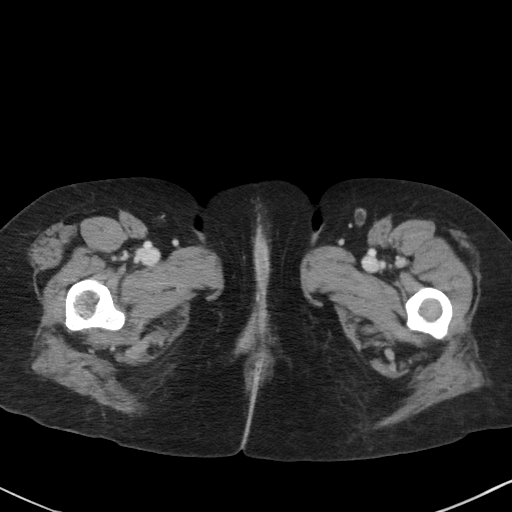
[im 7/107  bone]
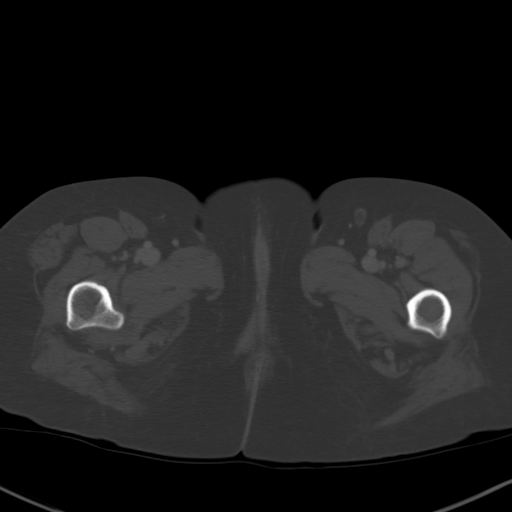
[im 19/107  soft-tissue]
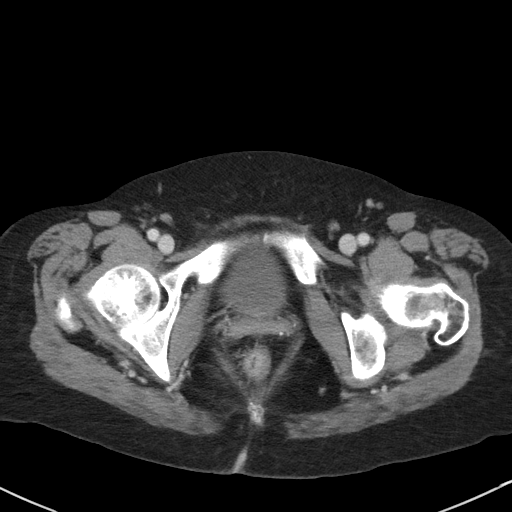
[im 25/107  soft-tissue]
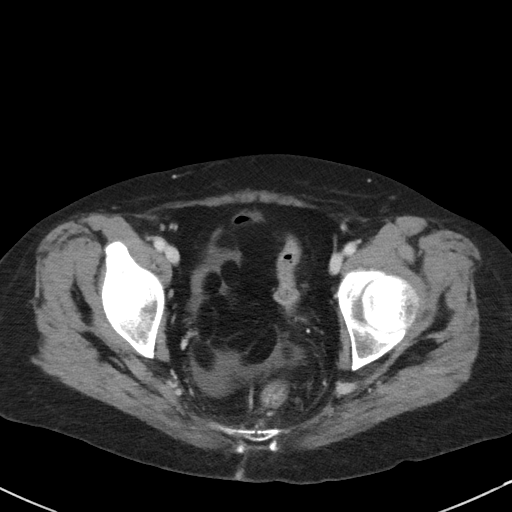
[im 32/107  soft-tissue]
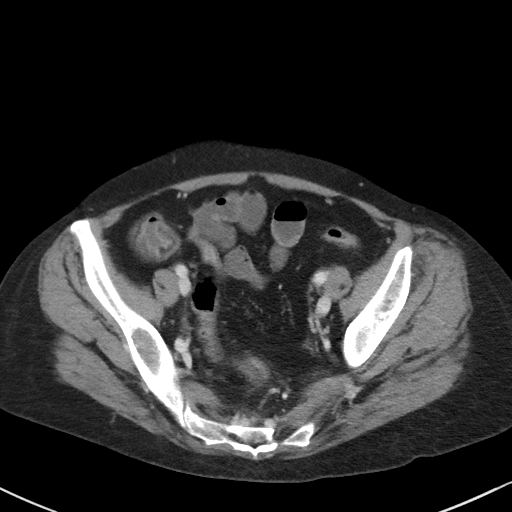
[im 44/107  soft-tissue]
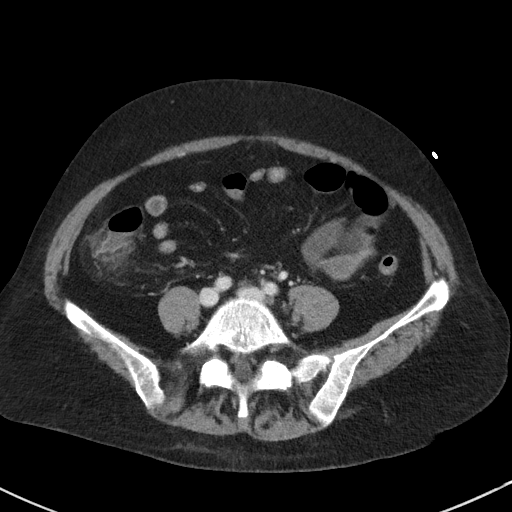
[im 50/107  soft-tissue]
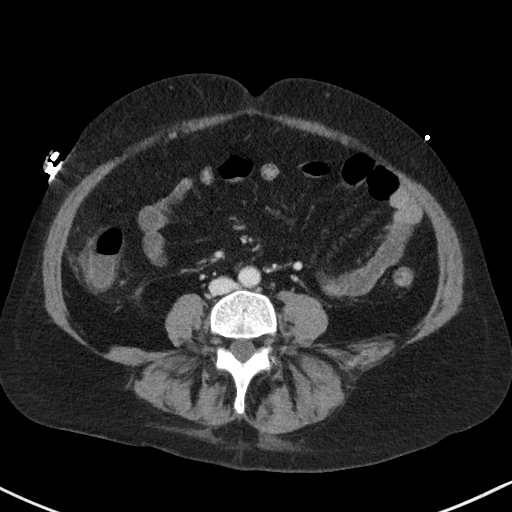
[im 57/107  soft-tissue]
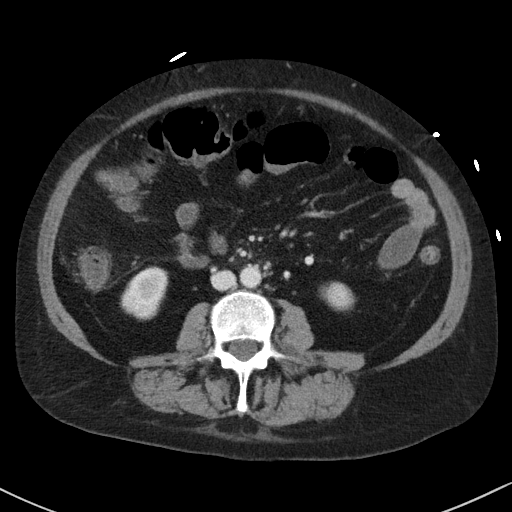
[im 69/107  soft-tissue]
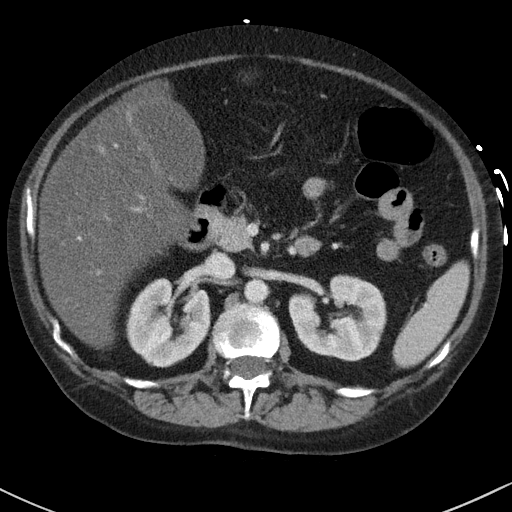
[im 75/107  soft-tissue]
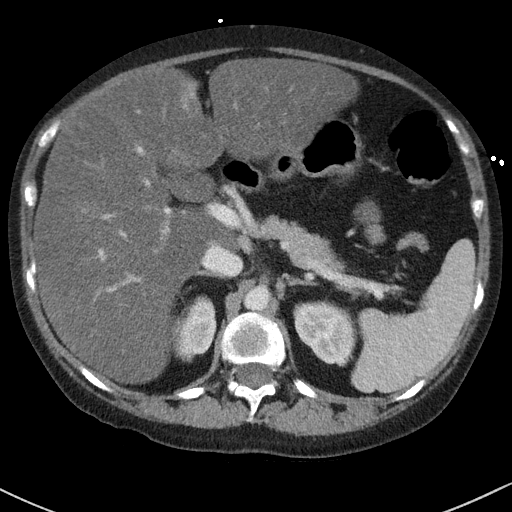
[im 75/107  bone]
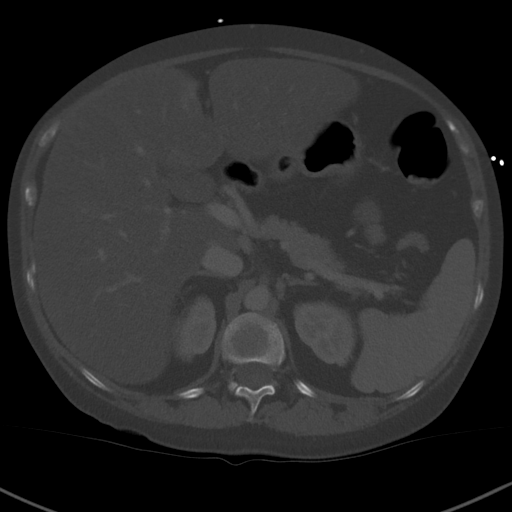
[im 82/107  soft-tissue]
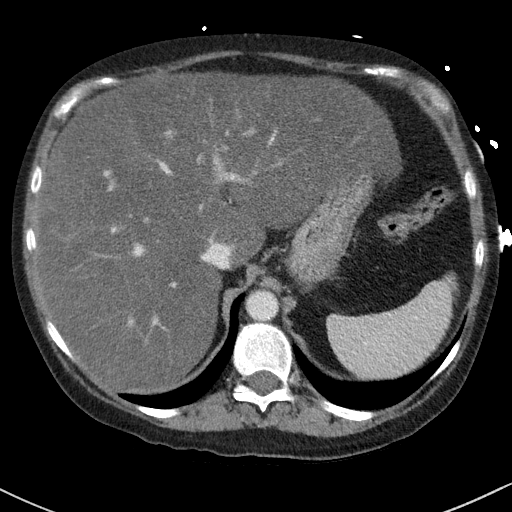
[im 94/107  soft-tissue]
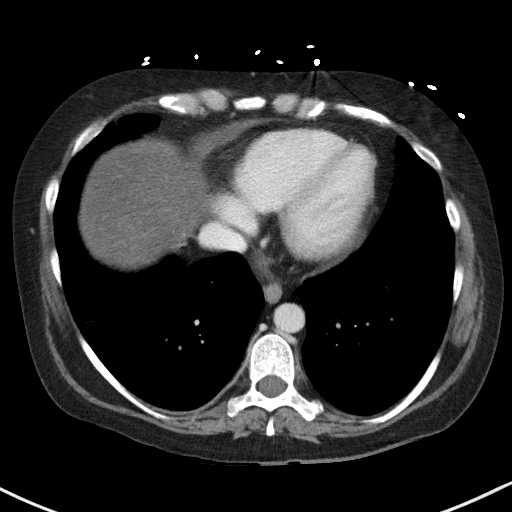
[im 100/107  soft-tissue]
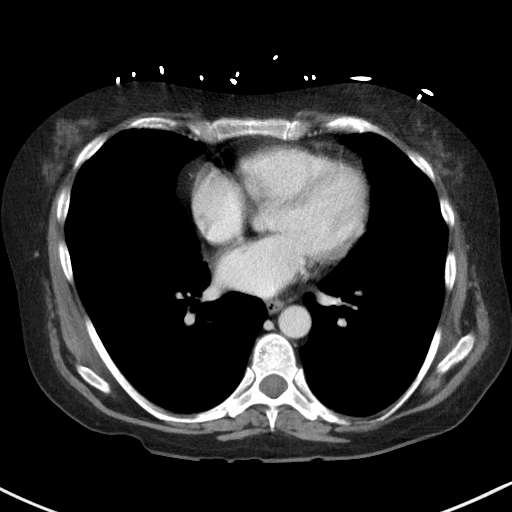

[Series 5: coronal st · coronal · 0.64mm/px · 3 of 96 slices shown]
[im 32/96  soft-tissue]
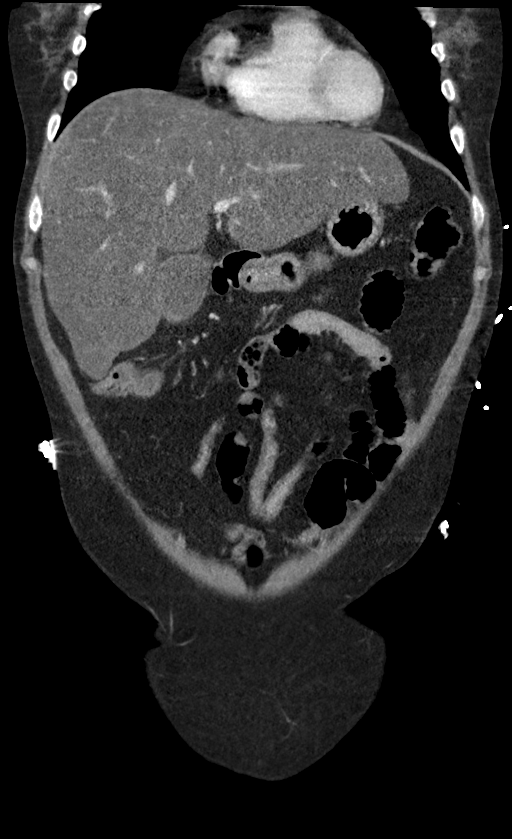
[im 43/96  soft-tissue]
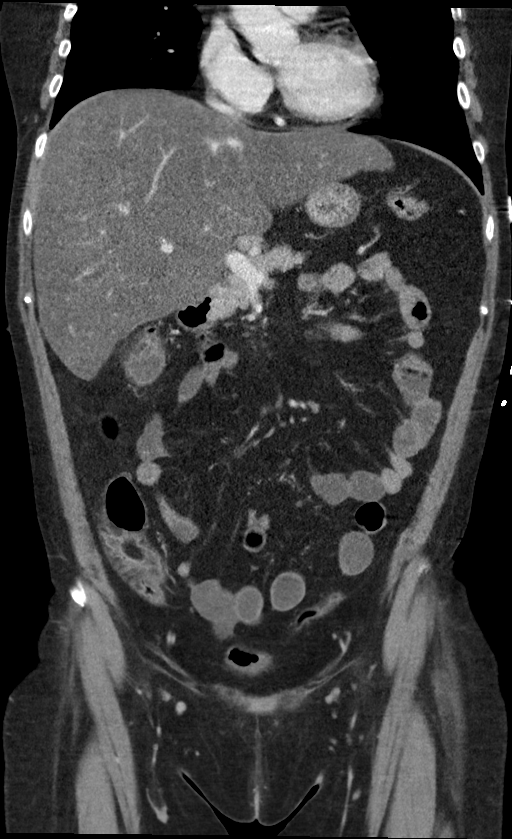
[im 53/96  soft-tissue]
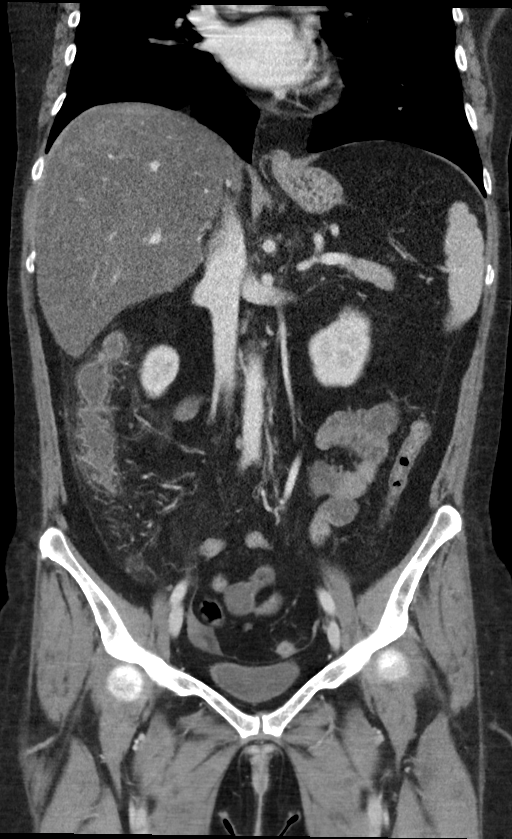

[15 of 46 positions shown; findings below may reference images not displayed]

FINDINGS: Lower chest: Mild linear scarring at both lung bases.

Hepatobiliary: Marked diffuse low density of the liver. Normal
appearing gallbladder.

Pancreas: Unremarkable. No pancreatic ductal dilatation or
surrounding inflammatory changes.

Spleen: Normal in size without focal abnormality.

Adrenals/Urinary Tract: Small amount of air in the urinary bladder,
compatible with recent catheterization. Normal appearing adrenal
glands, kidneys and ureters.

Stomach/Bowel: Lipomatous changes involving the ileocecal valve.
Mild fat density wall thickening involving the right colon. Minimal
right pericolonic soft tissue stranding. The remainder of the colon
is unremarkable. Unremarkable stomach and small bowel. No evidence
of appendicitis.

Vascular/Lymphatic: No significant vascular findings are present. No
enlarged abdominal or pelvic lymph nodes.

Reproductive: Status post hysterectomy. No adnexal masses.

Other: Small amount of free peritoneal fluid in the pelvis.

Musculoskeletal: Mild dextroconvex lumbar scoliosis. Otherwise,
unremarkable bones.
IMPRESSION: 1. Mild changes of previous colitis involving the right colon with
probable minimal acute right colon colitis.
2. Marked diffuse hepatic steatosis.
3. Small amount of free peritoneal fluid in the pelvis.

## 2021-02-26 ENCOUNTER — Ambulatory Visit (INDEPENDENT_AMBULATORY_CARE_PROVIDER_SITE_OTHER): Payer: Medicaid Other | Admitting: Orthopedic Surgery

## 2021-02-26 ENCOUNTER — Other Ambulatory Visit: Payer: Self-pay

## 2021-02-26 DIAGNOSIS — S92412A Displaced fracture of proximal phalanx of left great toe, initial encounter for closed fracture: Secondary | ICD-10-CM

## 2021-02-27 ENCOUNTER — Encounter: Payer: Self-pay | Admitting: Orthopedic Surgery

## 2021-02-27 NOTE — Progress Notes (Signed)
Office Visit Note   Patient: Annette Lucas           Date of Birth: May 08, 1971           MRN: [id number] Visit Date: 02/26/2021 Requested by: Rosalee Kaufman, PA-C [place],  [place] [address] PCP: Rosine Door  Subjective: Chief Complaint  Patient presents with  . Right Foot - Injury    HPI: Annette Lucas is a 50 year old female with left toe injury.  She injured her foot 2 weeks ago coming down the stairs.  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  Takes Neurontin 4 times a day.  Does not take any type of anticoagulant.  This was a plantarflexion type injury.  The pain started radiating up the leg about a week after the injury and she went to have radiographs performed 3 days ago which are reviewed today.  They do show a well aligned comminuted proximal phalanx fracture of the left great toe.              ROS: All systems reviewed are negative as they relate to the chief complaint within the history of present illness.  Patient denies  fevers or chills.   Assessment & Plan: Visit Diagnoses:  1. Displaced fracture of proximal phalanx of left great toe, initial encounter for closed fracture     Plan: Impression is left great toe proximal phalanx fracture with overall maintained coronal and sagittal alignment with comminution but no significant intra-articular displacement.  This should be a self-limited injury.  At 2 weeks out likely has some early healing present.  Continue weightbearing in fracture boot with 2-week return with repeat radiographs.  No indication for surgical fixation at this time.  She has quite a bit of swelling in the toe.  No fracture blisters.  Elevate is much as possible.  Follow-Up Instructions: No follow-ups on file.   Orders:  No orders of the defined types were placed in this encounter.  No orders of the defined types were placed in this encounter.      Procedures: No procedures performed   Clinical Data: No additional findings.  Objective: Vital Signs: LMP 06/14/2011  Physical Exam:   Constitutional: Patient appears well-developed HEENT:  Head: Normocephalic Eyes:EOM are normal Neck: Normal range of motion Cardiovascular: Normal rate Pulmonary/chest: Effort normal Neurologic: Patient is alert Skin: Skin is warm Psychiatric: Patient has normal mood and affect    Ortho Exam: Ortho exam demonstrates full active and passive range of motion of the ankles bilaterally.  Swelling is present in the left great toe region.  No real tarsometatarsal swelling.  Pedal pulses palpable.  Sensation is diminished consistent with neuropathy on both the plantar and dorsal surface of both feet.  Compartments soft.  Radiographic examination demonstrates fracture of the great toe proximal phalanx as described but no tarsometatarsal malalignment or irregular spacing.  Specialty Comments:  No specialty comments available.  Imaging: No results found.   PMFS History: Patient Active Problem List   Diagnosis Date Noted  . Abnormal LFTs   . Acute pyelonephritis 01/16/2020  . Acute colitis 01/16/2020  . Heme positive stool 01/16/2020  . Pyelonephritis 01/16/2020  . Hypokalemia 01/15/2020  . Anemia 01/15/2020   Past Medical History:  Diagnosis Date  . Allergy to meat   . GERD (gastroesophageal reflux disease)   . IBS (irritable bowel syndrome)     No family  history on file.  Past Surgical History:  Procedure Laterality Date  . CYSTOSCOPY  07/06/2011   Procedure: CYSTOSCOPY;  Surgeon: Clarene Duke, MD;  Location: [facility];  Service: Gynecology;  Laterality: N/A;  . ENDOMETRIAL ABLATION    . LAPAROSCOPIC SALPINGO OOPHERECTOMY Bilateral 09/01/2016   Procedure: LAPAROSCOPIC SALPINGO OOPHORECTOMY;  Surgeon: Cheri Fowler, MD;  Location: [facility];  Service: Gynecology;  Laterality: Bilateral;  . VAGINAL HYSTERECTOMY  07/06/2011   Procedure:  HYSTERECTOMY VAGINAL;  Surgeon: Clarene Duke, MD;  Location: [facility];  Service: Gynecology;  Laterality: N/A;   Social History   Occupational History  . Not on file  Tobacco Use  . Smoking status: Never Smoker  . Smokeless tobacco: Never Used  Vaping Use  . Vaping Use: Never used  Substance and Sexual Activity  . Alcohol use: Yes    Comment: occ  . Drug use: No  . Sexual activity: Not on file

## 2021-03-12 ENCOUNTER — Ambulatory Visit (INDEPENDENT_AMBULATORY_CARE_PROVIDER_SITE_OTHER): Payer: Medicaid Other | Admitting: Orthopedic Surgery

## 2021-03-12 ENCOUNTER — Ambulatory Visit: Payer: Self-pay

## 2021-03-12 ENCOUNTER — Encounter: Payer: Self-pay | Admitting: Orthopedic Surgery

## 2021-03-12 DIAGNOSIS — S92412A Displaced fracture of proximal phalanx of left great toe, initial encounter for closed fracture: Secondary | ICD-10-CM

## 2021-03-12 NOTE — Progress Notes (Signed)
   Post-Op Visit Note   Patient: Annette Lucas           Date of Birth: 04-Jan-1971           MRN: [id number] Visit Date: 03/12/2021 PCP: Annette Kaufman, PA-C   Assessment & Plan:  Chief Complaint:  Chief Complaint  Patient presents with   Left Foot - Follow-up, Fracture   Visit Diagnoses:  1. Displaced fracture of proximal phalanx of left great toe, initial encounter for closed fracture     Plan: Annette Lucas presents now 2 weeks out left great toe comminuted fracture.  She has been nonweightbearing.  On exam no calf tenderness negative [place].  Swelling is diminished in the left great toe.  Overall coronal and sagittal alignment appears intact compared to the right great toe.  Radiographs show no change in fracture alignment or position.  Plan is to continue nonweightbearing.  She has developed a partial-thickness abrasion/ulcer on the back of the heel.  She is going spend less time in the fracture boot.  Dressing applied to that today.  Come back in 2 weeks so we can repeat x-rays on the toe and potentially initiate some weightbearing and harder soled shoes.  For now nonweightbearing on the forefoot.  She is taking gabapentin for her neuropathy but no other accessory pain medicine other than ibuprofen.  Follow-Up Instructions: Return in about 2 weeks (around 03/26/2021).   Orders:  Orders Placed This Encounter  Procedures   XR Toe Great Left   No orders of the defined types were placed in this encounter.   Imaging: XR Toe Great Left  Result Date: 03/12/2021 AP lateral oblique radiographs left great toe reviewed.  Comminuted fracture again noted with overall maintenance of coronal and sagittal alignment.  No callus formation.  Fracture does extend into the joint of the IP joint and MTP joint.  No significant displacement at these levels.   PMFS History: Patient Active Problem List   Diagnosis Date Noted   Abnormal LFTs    Acute pyelonephritis 01/16/2020   Acute colitis  01/16/2020   Heme positive stool 01/16/2020   Pyelonephritis 01/16/2020   Hypokalemia 01/15/2020   Anemia 01/15/2020   Past Medical History:  Diagnosis Date   Allergy to meat    GERD (gastroesophageal reflux disease)    IBS (irritable bowel syndrome)     History reviewed. No pertinent family history.  Past Surgical History:  Procedure Laterality Date   CYSTOSCOPY  07/06/2011   Procedure: CYSTOSCOPY;  Surgeon: Clarene Duke, MD;  Location: [facility];  Service: Gynecology;  Laterality: N/A;   ENDOMETRIAL ABLATION     LAPAROSCOPIC SALPINGO OOPHERECTOMY Bilateral 09/01/2016   Procedure: LAPAROSCOPIC SALPINGO OOPHORECTOMY;  Surgeon: Cheri Fowler, MD;  Location: [facility];  Service: Gynecology;  Laterality: Bilateral;   VAGINAL HYSTERECTOMY  07/06/2011   Procedure: HYSTERECTOMY VAGINAL;  Surgeon: Clarene Duke, MD;  Location: [facility];  Service: Gynecology;  Laterality: N/A;   Social History   Occupational History   Not on file  Tobacco Use   Smoking status: Never   Smokeless tobacco: Never  Vaping Use   Vaping Use: Never used  Substance and Sexual Activity   Alcohol use: Yes    Comment: occ   Drug use: No   Sexual activity: Not on file

## 2021-03-26 ENCOUNTER — Ambulatory Visit: Payer: Self-pay

## 2021-03-26 ENCOUNTER — Ambulatory Visit (INDEPENDENT_AMBULATORY_CARE_PROVIDER_SITE_OTHER): Payer: Medicaid Other | Admitting: Orthopedic Surgery

## 2021-03-26 DIAGNOSIS — S92412A Displaced fracture of proximal phalanx of left great toe, initial encounter for closed fracture: Secondary | ICD-10-CM

## 2021-03-28 ENCOUNTER — Encounter: Payer: Self-pay | Admitting: Orthopedic Surgery

## 2021-03-28 NOTE — Progress Notes (Signed)
   Post-Op Visit Note   Patient: Annette Lucas           Date of Birth: 1971/06/15           MRN: [id number] Visit Date: 03/26/2021 PCP: Rosalee Kaufman, PA-C   Assessment & Plan:  Chief Complaint:  Chief Complaint  Patient presents with   Left Foot - Follow-up, Fracture   Visit Diagnoses:  1. Displaced fracture of proximal phalanx of left great toe, initial encounter for closed fracture     Plan: Annette Lucas is a 50 year old patient with left great toe fracture now 6 weeks out.  She has been getting around in the fracture boot.  She has neuropathy affecting both feet.  On exam there is still some swelling but not too much pain with range of motion of the IP or MTP joint.  Motion is predictably stiff.  Radiographs look very good.  Plan at this time is to transition out of boot into regular shoes.  [place] shoes would be ideal in this circumstance.  Shoes with a relatively hard sole encouraged.  Come back in 6 weeks for final check.  Patient is concerned about the swelling in the toe which is completely expected based on the severity of the fracture.  Overall bony alignment looks good.  Follow-up in 6 weeks for final clinical check.  May or may not need radiographs at that time depending on how it is feeling.  Follow-Up Instructions: No follow-ups on file.   Orders:  Orders Placed This Encounter  Procedures   XR Toe Great Left   No orders of the defined types were placed in this encounter.   Imaging: No results found.  PMFS History: Patient Active Problem List   Diagnosis Date Noted   Abnormal LFTs    Acute pyelonephritis 01/16/2020   Acute colitis 01/16/2020   Heme positive stool 01/16/2020   Pyelonephritis 01/16/2020   Hypokalemia 01/15/2020   Anemia 01/15/2020   Past Medical History:  Diagnosis Date   Allergy to meat    GERD (gastroesophageal reflux disease)    IBS (irritable bowel syndrome)     History reviewed. No pertinent family history.  Past  Surgical History:  Procedure Laterality Date   CYSTOSCOPY  07/06/2011   Procedure: CYSTOSCOPY;  Surgeon: Clarene Duke, MD;  Location: [facility];  Service: Gynecology;  Laterality: N/A;   ENDOMETRIAL ABLATION     LAPAROSCOPIC SALPINGO OOPHERECTOMY Bilateral 09/01/2016   Procedure: LAPAROSCOPIC SALPINGO OOPHORECTOMY;  Surgeon: Cheri Fowler, MD;  Location: [facility];  Service: Gynecology;  Laterality: Bilateral;   VAGINAL HYSTERECTOMY  07/06/2011   Procedure: HYSTERECTOMY VAGINAL;  Surgeon: Clarene Duke, MD;  Location: [facility];  Service: Gynecology;  Laterality: N/A;   Social History   Occupational History   Not on file  Tobacco Use   Smoking status: Never   Smokeless tobacco: Never  Vaping Use   Vaping Use: Never used  Substance and Sexual Activity   Alcohol use: Yes    Comment: occ   Drug use: No   Sexual activity: Not on file

## 2021-04-27 ENCOUNTER — Other Ambulatory Visit: Payer: Self-pay

## 2021-04-27 ENCOUNTER — Ambulatory Visit (INDEPENDENT_AMBULATORY_CARE_PROVIDER_SITE_OTHER): Payer: Medicaid Other

## 2021-04-27 ENCOUNTER — Ambulatory Visit (INDEPENDENT_AMBULATORY_CARE_PROVIDER_SITE_OTHER): Payer: Medicaid Other | Admitting: Orthopedic Surgery

## 2021-04-27 DIAGNOSIS — S92412A Displaced fracture of proximal phalanx of left great toe, initial encounter for closed fracture: Secondary | ICD-10-CM

## 2021-05-01 ENCOUNTER — Encounter: Payer: Self-pay | Admitting: Orthopedic Surgery

## 2021-05-01 NOTE — Progress Notes (Signed)
   Post-Fracture Visit Note   Patient: Annette Lucas           Date of Birth: 1971/08/10           MRN: [id number] Visit Date: 04/27/2021 PCP: Rosalee Kaufman, PA-C   Assessment & Plan:  Chief Complaint:  Chief Complaint  Patient presents with   Left Foot - Fracture, Follow-up   Visit Diagnoses:  1. Displaced fracture of proximal phalanx of left great toe, initial encounter for closed fracture     Plan: Patient is a 50 year old female who returns for evaluation of the left great toe fracture.  She states that her pain is improving and getting better and better.  She is taking vitamin D supplementation every day.  She does have occasional throbbing pain at night but nothing that she really has to take any medication for consistently.  Pain is not waking her up at night.  New radiographs taken today show excellent fracture consolidation compared with prior radiographs.  She has minimal tenderness on exam today with palpable DP pulse.  With improving symptoms and improving radiographs, plan for patient to follow up with the office as needed if her pain does not continue to improve.  If she is still having significant pain in 6 to 8 weeks, recommend she return to the office for repeat evaluation.  Follow-Up Instructions: No follow-ups on file.   Orders:  Orders Placed This Encounter  Procedures   XR Toe Great Left   No orders of the defined types were placed in this encounter.   Imaging: No results found.  PMFS History: Patient Active Problem List   Diagnosis Date Noted   Abnormal LFTs    Acute pyelonephritis 01/16/2020   Acute colitis 01/16/2020   Heme positive stool 01/16/2020   Pyelonephritis 01/16/2020   Hypokalemia 01/15/2020   Anemia 01/15/2020   Past Medical History:  Diagnosis Date   Allergy to meat    GERD (gastroesophageal reflux disease)    IBS (irritable bowel syndrome)     No family history on file.  Past Surgical History:  Procedure Laterality Date    CYSTOSCOPY  07/06/2011   Procedure: CYSTOSCOPY;  Surgeon: Clarene Duke, MD;  Location: [facility];  Service: Gynecology;  Laterality: N/A;   ENDOMETRIAL ABLATION     LAPAROSCOPIC SALPINGO OOPHERECTOMY Bilateral 09/01/2016   Procedure: LAPAROSCOPIC SALPINGO OOPHORECTOMY;  Surgeon: Cheri Fowler, MD;  Location: [facility];  Service: Gynecology;  Laterality: Bilateral;   VAGINAL HYSTERECTOMY  07/06/2011   Procedure: HYSTERECTOMY VAGINAL;  Surgeon: Clarene Duke, MD;  Location: [facility];  Service: Gynecology;  Laterality: N/A;   Social History   Occupational History   Not on file  Tobacco Use   Smoking status: Never   Smokeless tobacco: Never  Vaping Use   Vaping Use: Never used  Substance and Sexual Activity   Alcohol use: Yes    Comment: occ   Drug use: No   Sexual activity: Not on file

## 2022-09-13 ENCOUNTER — Emergency Department (HOSPITAL_COMMUNITY): Payer: Medicaid Other

## 2022-09-13 ENCOUNTER — Emergency Department (HOSPITAL_COMMUNITY)
Admission: EM | Admit: 2022-09-13 | Discharge: 2022-09-13 | Disposition: A | Discharge status: Home / Self Care | Payer: Medicaid Other | Attending: Emergency Medicine | Admitting: Emergency Medicine

## 2022-09-13 ENCOUNTER — Encounter (HOSPITAL_COMMUNITY): Payer: Self-pay

## 2022-09-13 DIAGNOSIS — Z1152 Encounter for screening for COVID-19: Secondary | ICD-10-CM | POA: Diagnosis not present

## 2022-09-13 DIAGNOSIS — N390 Urinary tract infection, site not specified: Secondary | ICD-10-CM | POA: Insufficient documentation

## 2022-09-13 DIAGNOSIS — R531 Weakness: Secondary | ICD-10-CM | POA: Diagnosis present

## 2022-09-13 DIAGNOSIS — R29898 Other symptoms and signs involving the musculoskeletal system: Secondary | ICD-10-CM

## 2022-09-13 DIAGNOSIS — E876 Hypokalemia: Secondary | ICD-10-CM | POA: Diagnosis not present

## 2022-09-13 DIAGNOSIS — R4701 Aphasia: Secondary | ICD-10-CM | POA: Diagnosis not present

## 2022-09-13 LAB — URINALYSIS, ROUTINE W REFLEX MICROSCOPIC
Bilirubin Urine: NEGATIVE
Glucose, UA: NEGATIVE mg/dL
Ketones, ur: NEGATIVE mg/dL
Nitrite: NEGATIVE
Protein, ur: NEGATIVE mg/dL
Specific Gravity, Urine: 1.006 (ref 1.005–1.030)
pH: 6 (ref 5.0–8.0)

## 2022-09-13 LAB — DIFFERENTIAL
Abs Immature Granulocytes: 0.05 10*3/uL (ref 0.00–0.07)
Basophils Absolute: 0.1 10*3/uL (ref 0.0–0.1)
Basophils Relative: 0 %
Eosinophils Absolute: 0.1 10*3/uL (ref 0.0–0.5)
Eosinophils Relative: 1 %
Immature Granulocytes: 0 %
Lymphocytes Relative: 16 %
Lymphs Abs: 2.1 10*3/uL (ref 0.7–4.0)
Monocytes Absolute: 0.9 10*3/uL (ref 0.1–1.0)
Monocytes Relative: 7 %
Neutro Abs: 9.4 10*3/uL — ABNORMAL HIGH (ref 1.7–7.7)
Neutrophils Relative %: 76 %

## 2022-09-13 LAB — RESP PANEL BY RT-PCR (RSV, FLU A&B, COVID)  RVPGX2
Influenza A by PCR: NEGATIVE
Influenza B by PCR: NEGATIVE
Resp Syncytial Virus by PCR: POSITIVE — AB
SARS Coronavirus 2 by RT PCR: NEGATIVE

## 2022-09-13 LAB — COMPREHENSIVE METABOLIC PANEL
ALT: 47 U/L — ABNORMAL HIGH (ref 0–44)
AST: 74 U/L — ABNORMAL HIGH (ref 15–41)
Albumin: 2.9 g/dL — ABNORMAL LOW (ref 3.5–5.0)
Alkaline Phosphatase: 188 U/L — ABNORMAL HIGH (ref 38–126)
Anion gap: 10 (ref 5–15)
BUN: 9 mg/dL (ref 6–20)
CO2: 25 mmol/L (ref 22–32)
Calcium: 8.6 mg/dL — ABNORMAL LOW (ref 8.9–10.3)
Chloride: 101 mmol/L (ref 98–111)
Creatinine, Ser: 0.37 mg/dL — ABNORMAL LOW (ref 0.44–1.00)
GFR, Estimated: >60 mL/min (ref >60)
Glucose, Bld: 107 mg/dL — ABNORMAL HIGH (ref 70–99)
Potassium: 2.9 mmol/L — ABNORMAL LOW (ref 3.5–5.1)
Sodium: 136 mmol/L (ref 135–145)
Total Bilirubin: 0.9 mg/dL (ref 0.3–1.2)
Total Protein: 7.1 g/dL (ref 6.5–8.1)

## 2022-09-13 LAB — CBG MONITORING, ED: Glucose-Capillary: 85 mg/dL (ref 70–99)

## 2022-09-13 LAB — PROTIME-INR
INR: 1.1 (ref 0.8–1.2)
Prothrombin Time: 14.5 seconds (ref 11.4–15.2)

## 2022-09-13 LAB — ETHANOL: Alcohol, Ethyl (B): <10 mg/dL (ref <10)

## 2022-09-13 LAB — CBC
HCT: 37.6 % (ref 36.0–46.0)
Hemoglobin: 12.8 g/dL (ref 12.0–15.0)
MCH: 36.7 pg — ABNORMAL HIGH (ref 26.0–34.0)
MCHC: 34 g/dL (ref 30.0–36.0)
MCV: 107.7 fL — ABNORMAL HIGH (ref 80.0–100.0)
Platelets: 280 10*3/uL (ref 150–400)
RBC: 3.49 MIL/uL — ABNORMAL LOW (ref 3.87–5.11)
RDW: 12.2 % (ref 11.5–15.5)
WBC: 12.6 10*3/uL — ABNORMAL HIGH (ref 4.0–10.5)
nRBC: 0 % (ref 0.0–0.2)

## 2022-09-13 LAB — MAGNESIUM: Magnesium: 1.6 mg/dL — ABNORMAL LOW (ref 1.7–2.4)

## 2022-09-13 LAB — APTT: aPTT: 32 seconds (ref 24–36)

## 2022-09-13 MED ORDER — POTASSIUM CHLORIDE CRYS ER 20 MEQ PO TBCR
40.0000 meq | EXTENDED_RELEASE_TABLET | Freq: Once | ORAL | Status: AC
  Administered 2022-09-13: 40 meq via ORAL
  Filled 2022-09-13: qty 2

## 2022-09-13 MED ORDER — SULFAMETHOXAZOLE-TRIMETHOPRIM 800-160 MG PO TABS: 1.0000 | ORAL_TABLET | Freq: Two times a day (BID) | ORAL | 0 refills | Status: DC

## 2022-09-13 MED ORDER — POTASSIUM CHLORIDE ER 10 MEQ PO TBCR: 10.0000 meq | EXTENDED_RELEASE_TABLET | Freq: Every day | ORAL | 0 refills | Status: DC

## 2022-09-13 MED ORDER — SODIUM CHLORIDE 0.9% FLUSH: 3.0000 mL | Freq: Once | INTRAVENOUS | Status: DC

## 2022-09-13 MED ORDER — MAGNESIUM SULFATE 2 GM/50ML IV SOLN
2.0000 g | Freq: Once | INTRAVENOUS | Status: AC
  Administered 2022-09-13: 2 g via INTRAVENOUS
  Filled 2022-09-13: qty 50

## 2022-09-13 NOTE — ED Provider Triage Note (Signed)
Emergency Medicine Provider Triage Evaluation Note  Annette Lucas , a 51 y.o. female  was evaluated in triage.  Pt complains of speech weakness in legs.  Review of Systems  Positive: Recent flulike symptoms Negative: No headache  Physical Exam  BP 123/83 (BP Location: Right Arm)   Pulse (!) 102   Temp 97.7 F (36.5 C) (Oral)   Resp 15   LMP 06/14/2011   SpO2 92%  Gen:   Awake, no distress   Resp:  Normal effort  MSK:   Moves extremities without difficulty  Other:    Medical Decision Making  Medically screening exam initiated at 6:01 PM.  Appropriate orders placed.  Sherisa Gilvin was informed that the remainder of the evaluation will be completed by another provider, this initial triage assessment does not replace that evaluation, and the importance of remaining in the ED until their evaluation is complete.     Hayden Rasmussen, MD 09/13/22 [phone]

## 2022-09-13 NOTE — ED Provider Notes (Signed)
[facility] EMERGENCY DEPARTMENT Provider Note   CSN: [id number] Arrival date & time: 09/13/22  1746     History {Add pertinent medical, surgical, social history, OB history to HPI:1} Chief Complaint  Patient presents with   Aphasia    Annette Lucas is a 51 y.o. female.  She has a history of a polyneuropathy.  She is here with 4 days of some difficulty with her speech and increased unsteadiness and weakness in her legs.  She endorses sometimes headaches and sometimes blurry vision.  No chest pain shortness of breath nausea vomiting diarrhea.  She said she has numbness in her hands and feet but this is baseline for her.  She denies any alcohol use.  The history is provided by the patient.  Weakness Severity:  Moderate Onset quality:  Gradual Duration:  4 days Timing:  Constant Progression:  Unchanged Chronicity:  New Context: not alcohol use   Relieved by:  Nothing Worsened by:  Activity Ineffective treatments:  None tried Associated symptoms: aphasia, ataxia, extremity numbness, headaches and vision change   Associated symptoms: no abdominal pain, no cough, no diarrhea, no fever, no foul-smelling urine, no loss of consciousness, no nausea and no vomiting   Risk factors: neurologic disease        Home Medications Prior to Admission medications   Medication Sig Start Date End Date Taking? Authorizing Provider  cetirizine (ZYRTEC) 10 MG tablet Take 10 mg by mouth daily as needed.     [provider]  folic acid (FOLVITE) 1 MG tablet Take 1 tablet (1 mg total) by mouth daily. 01/18/20   Orson Eva, MD  ibuprofen (ADVIL,MOTRIN) 200 MG tablet Take 400 mg by mouth every 6 (six) hours as needed. For pain.     [provider]  magnesium oxide (MAG-OX) 400 (241.3 Mg) MG tablet Take 1 tablet (400 mg total) by mouth daily. 01/19/20   Orson Eva, MD  metroNIDAZOLE (FLAGYL) 500 MG tablet Take 1 tablet (500 mg total) by mouth every 8 (eight) hours. 01/19/20   Orson Eva,  MD  sulfamethoxazole-trimethoprim (BACTRIM DS) 800-160 MG tablet Take 1 tablet by mouth every 12 (twelve) hours. 01/19/20   Orson Eva, MD  vitamin B-12 (CYANOCOBALAMIN) 500 MCG tablet Take 1 tablet (500 mcg total) by mouth daily. 01/18/20   Orson Eva, MD  vitamin B-12 (CYANOCOBALAMIN) 500 MCG tablet Take 1 tablet (500 mcg total) by mouth daily. 01/20/20   Orson Eva, MD      Allergies    Levofloxacin, Other, and Penicillins    Review of Systems   Review of Systems  Constitutional:  Negative for fever.  HENT:  Negative for sore throat.   Eyes:  Positive for visual disturbance.  Respiratory:  Negative for cough.   Gastrointestinal:  Negative for abdominal pain, diarrhea, nausea and vomiting.  Musculoskeletal:  Positive for gait problem.  Skin:  Negative for rash.  Neurological:  Positive for speech difficulty, weakness, numbness and headaches. Negative for loss of consciousness.    Physical Exam Updated Vital Signs BP 137/77   Pulse (!) 108   Temp 97.7 F (36.5 C) (Oral)   Resp 15   LMP 06/14/2011   SpO2 96%  Physical Exam Vitals and nursing note reviewed.  Constitutional:      General: She is not in acute distress.    Appearance: Normal appearance. She is well-developed.  HENT:     Head: Normocephalic and atraumatic.  Eyes:     Conjunctiva/sclera: Conjunctivae normal.  Cardiovascular:  Rate and Rhythm: Normal rate and regular rhythm.     Heart sounds: No murmur heard. Pulmonary:     Effort: Pulmonary effort is normal. No respiratory distress.     Breath sounds: Normal breath sounds.  Abdominal:     Palpations: Abdomen is soft.     Tenderness: There is no abdominal tenderness.  Musculoskeletal:        General: No tenderness. Normal range of motion.     Cervical back: Neck supple.     Right lower leg: Edema present.     Left lower leg: Edema present.  Skin:    General: Skin is warm and dry.     Capillary Refill: Capillary refill takes less than 2 seconds.   Neurological:     General: No focal deficit present.     Mental Status: She is alert and oriented to person, place, and time.     Cranial Nerves: No cranial nerve deficit.     Sensory: No sensory deficit.     Motor: No weakness.     ED Results / Procedures / Treatments   Labs (all labs ordered are listed, but only abnormal results are displayed) Labs Reviewed  CBC - Abnormal; Notable for the following components:      Result Value   WBC 12.6 (*)    RBC 3.49 (*)    MCV 107.7 (*)    MCH 36.7 (*)    All other components within normal limits  DIFFERENTIAL - Abnormal; Notable for the following components:   Neutro Abs 9.4 (*)    All other components within normal limits  COMPREHENSIVE METABOLIC PANEL - Abnormal; Notable for the following components:   Potassium 2.9 (*)    Glucose, Bld 107 (*)    Creatinine, Ser 0.37 (*)    Calcium 8.6 (*)    Albumin 2.9 (*)    AST 74 (*)    ALT 47 (*)    Alkaline Phosphatase 188 (*)    All other components within normal limits  RESP PANEL BY RT-PCR (RSV, FLU A&B, COVID)  RVPGX2  PROTIME-INR  APTT  ETHANOL  CBG MONITORING, ED  CBG MONITORING, ED    EKG EKG Interpretation  Date/Time:  Monday September 13 2022 18:06:35 EST Ventricular Rate:  100 PR Interval:  108 QRS Duration: 82 QT Interval:  368 QTC Calculation: 474 R Axis:   4 Text Interpretation: Sinus rhythm with short PR Cannot rule out Anterior infarct (cited on or before 16-Jan-2020) ST & T wave abnormality, consider inferolateral ischemia Abnormal ECG When compared with ECG of 16-Jan-2020 06:52, T wave inversion less evident in Lateral leads Confirmed by Aletta Edouard [phone]) on 09/13/2022 6:10:34 PM  Radiology CT HEAD WO CONTRAST  Result Date: 09/13/2022 CLINICAL DATA:  Aphasia dizziness for 4 days EXAM: CT HEAD WITHOUT CONTRAST TECHNIQUE: Contiguous axial images were obtained from the base of the skull through the vertex without intravenous contrast. RADIATION DOSE  REDUCTION: This exam was performed according to the departmental dose-optimization program which includes automated exposure control, adjustment of the mA and/or kV according to patient size and/or use of iterative reconstruction technique. COMPARISON:  None Available. FINDINGS: Brain: No evidence of acute infarction, hemorrhage, hydrocephalus, extra-axial collection or mass lesion/mass effect. Vascular: No hyperdense vessel or unexpected calcification. Skull: Normal. Negative for fracture or focal lesion. Sinuses/Orbits: No acute finding. Other: None IMPRESSION: Negative head CT without contrast. Electronically Signed   By: Donavan Foil M.D.   On: 09/13/2022 19:05    Procedures  Procedures  {Document cardiac monitor, telemetry assessment procedure when appropriate:1}  Medications Ordered in ED Medications  sodium chloride flush (NS) 0.9 % injection 3 mL (has no administration in time range)  potassium chloride SA (KLOR-CON M) CR tablet 40 mEq (has no administration in time range)    ED Course/ Medical Decision Making/ A&P                           Medical Decision Making Amount and/or Complexity of Data Reviewed Labs: ordered. Radiology: ordered.  Risk Prescription drug management.   This patient complains of ***; this involves an extensive number of treatment Options and is a complaint that carries with it a high risk of complications and morbidity. The differential includes ***  I ordered, reviewed and interpreted labs, which included *** I ordered medication *** and reviewed PMP when indicated. I ordered imaging studies which included *** and I independently    visualized and interpreted imaging which showed *** Additional history obtained from *** Previous records obtained and reviewed *** I consulted *** and discussed lab and imaging findings and discussed disposition.  Cardiac monitoring reviewed, *** Social determinants considered, *** Critical Interventions: ***  After  the interventions stated above, I reevaluated the patient and found *** Admission and further testing considered, ***   {Document critical care time when appropriate:1} {Document review of labs and clinical decision tools ie heart score, Chads2Vasc2 etc:1}  {Document your independent review of radiology images, and any outside records:1} {Document your discussion with family members, caretakers, and with consultants:1} {Document social determinants of health affecting pt's care:1} {Document your decision making why or why not admission, treatments were needed:1} Final Clinical Impression(s) / ED Diagnoses Final diagnoses:  None    Rx / DC Orders ED Discharge Orders     None

## 2022-09-13 NOTE — ED Triage Notes (Addendum)
Pt c/o slurred speech, dizziness for four days. Pt does not have hx of CVA. Not on blood thinners. Dr. Melina Copa called to triage for EDP evaluation. No code stroke as pt is outside of window per Dr. Melina Copa

## 2022-09-13 NOTE — ED Notes (Signed)
Pt ambulated from wc to toilet/wc to bed. Pt has difficulty walking. Pt very unsteady and wobbly. Requires assistance. States that she cannot feel her feet.

## 2022-09-13 NOTE — ED Notes (Signed)
Went over [employer]. All questions answered. Wheeled out to lobby and assisted into car.

## 2022-09-13 NOTE — Discharge Instructions (Signed)
You are seen in the emergency department for 4 days of leg weakness and some slurred speech.  You had a CAT scan that did not show any obvious signs of stroke.  Your potassium and magnesium were significantly low and there was also signs of a urinary tract infection.  We are putting you on a potassium supplement and some antibiotics.  Please continue your regular medications and follow-up with your primary care doctor and your neurology team.  Return to the emergency department if any worsening or concerning symptoms.

## 2022-09-15 LAB — URINE CULTURE

## 2022-10-19 ENCOUNTER — Encounter: Payer: Self-pay | Admitting: Internal Medicine

## 2022-11-16 ENCOUNTER — Telehealth: Payer: Self-pay | Admitting: Gastroenterology

## 2022-11-16 ENCOUNTER — Encounter: Payer: Self-pay | Admitting: Gastroenterology

## 2022-11-16 ENCOUNTER — Ambulatory Visit (INDEPENDENT_AMBULATORY_CARE_PROVIDER_SITE_OTHER): Payer: Medicaid Other | Admitting: Gastroenterology

## 2022-11-16 VITALS — BP 121/81 | HR 97 | Temp 98.4°F | Ht 64.0 in

## 2022-11-16 DIAGNOSIS — R531 Weakness: Secondary | ICD-10-CM | POA: Diagnosis not present

## 2022-11-16 DIAGNOSIS — R7989 Other specified abnormal findings of blood chemistry: Secondary | ICD-10-CM

## 2022-11-16 NOTE — Progress Notes (Unsigned)
GI Office Note    Referring Provider: Lanelle Bal, PA-C Primary Care Physician:  Lanelle Bal, PA-C  Primary Gastroenterologist: Elon Alas. Abbey Chatters, DO   Chief Complaint   Chief Complaint  Patient presents with   Elevated LFT's    Patient here today referred by [facility] due to elevated LFT's. Patient denies any nausea, vomiting, abdominal pains, constipation nor diarrhea.     History of Present Illness   Annette Lucas is a 52 y.o. female presenting today at the request of Lanelle Bal, PA-C for elevated LFTs.   Labs 09/13/22: WBC 12.6, AST 74, ALT 47, Alk Phos 188. Ehtanol level negative.   Labs 09/23/2022: Alk phos 213, AST 59, ALT 30, albumin 3.1, ferritin 913, B12 greater than 2000, WBC 12  Labs 10/08/2022: Albumin 3.2, alk phos 189, AST 43, ALT 31, creatinine 0.58, iron 69, ferritin 870, B12 1211, normal folate, hemoglobin 12.3, MCV 98  Labs 11/04/22: Iron normal, ferritin 781, ANA negative. HFE testing unavailable for review.   Abdominal ultrasound 10/14/2022: -Hepatomegaly with liver measuring 19 cm in overall length -Hepatic steatosis -No biliary or intrahepatic ductal dilation or lesions -No definite gallstones, wall thickening or pericholecystic fluid.  Questionable small amounts of sludge layering in the gallbladder -CBD measuring 4 mm -Spleen in upper limits of normal.  Having lower extremity weakness and neuropathy in hands and feet. Unable to walk. Has had swelling in her legs and feet as well. Doing PT currently and due to see neurology end of next month in [facility]. Symptoms began just prior to her ED visit in December. Denied any fever, chills. Lack of energy was present. Is not a big eater at baseline and not as normal as it used to be. Was treated for UTI in the ED. No N/V. Some pruritus - certain clothes make her feel itchy more sensitive than she used to be. Bowel habits are improved Used to have loose stools and goes pretty much daily. No melena or  brbpr. No abdominal pain. No unintentional weight loss. No reflux or dysphagia. No confusion or mental status changes. Foes report some bloating in her stomach are since all of her other symptoms developed. Drinks a lot of water and is constantly thirsty and may drink large amounts of fluid and not have a large amount of fluid. Feels like her edema is slightly better but not significantly. In the mornings her legs and feet are not as swollen and gets worse throughout the day.   Occasional alcohol use.    Current Outpatient Medications  Medication Sig Dispense Refill   folic acid (FOLVITE) 1 MG tablet Take 1 tablet (1 mg total) by mouth daily.     gabapentin (NEURONTIN) 600 MG tablet Take 600 mg by mouth 4 (four) times daily.     ibuprofen (ADVIL,MOTRIN) 200 MG tablet Take 400 mg by mouth every 6 (six) hours as needed. For pain.      magnesium oxide (MAG-OX) 400 (241.3 Mg) MG tablet Take 1 tablet (400 mg total) by mouth daily. 30 tablet 0   milk thistle 175 MG tablet Take 175 mg by mouth daily.     pantoprazole (PROTONIX) 40 MG tablet Take 40 mg by mouth daily.     potassium chloride (KLOR-CON) 10 MEQ tablet Take 1 tablet (10 mEq total) by mouth daily. (Patient taking differently: Take 20 mEq by mouth daily.) 10 tablet 0   torsemide (DEMADEX) 20 MG tablet Take 20 mg by mouth 2 (two) times daily.  Turmeric (QC TUMERIC COMPLEX) 500 MG CAPS Take by mouth daily at 6 (six) AM.     vitamin B-12 (CYANOCOBALAMIN) 500 MCG tablet Take 1 tablet (500 mcg total) by mouth daily. (Patient taking differently: Take 5,000 mcg by mouth daily.)     No current facility-administered medications for this visit.    Past Medical History:  Diagnosis Date   Allergy to meat    GERD (gastroesophageal reflux disease)    IBS (irritable bowel syndrome)     Past Surgical History:  Procedure Laterality Date   CYSTOSCOPY  07/06/2011   Procedure: CYSTOSCOPY;  Surgeon: Clarene Duke, MD;  Location: [facility];  Service:  Gynecology;  Laterality: N/A;   ENDOMETRIAL ABLATION     LAPAROSCOPIC SALPINGO OOPHERECTOMY Bilateral 09/01/2016   Procedure: LAPAROSCOPIC SALPINGO OOPHORECTOMY;  Surgeon: Cheri Fowler, MD;  Location: [facility];  Service: Gynecology;  Laterality: Bilateral;   VAGINAL HYSTERECTOMY  07/06/2011   Procedure: HYSTERECTOMY VAGINAL;  Surgeon: Clarene Duke, MD;  Location: [facility];  Service: Gynecology;  Laterality: N/A;    History reviewed. No pertinent family history.  Allergies as of 11/16/2022 - Review Complete 11/16/2022  Allergen Reaction Noted   Levofloxacin Anaphylaxis 06/22/2011   Other Anaphylaxis 08/23/2016   Penicillins Anaphylaxis 06/22/2011    Social History   Socioeconomic History   Marital status: Married    Spouse name: Not on file   Number of children: Not on file   Years of education: Not on file   Highest education level: Not on file  Occupational History   Not on file  Tobacco Use   Smoking status: Never   Smokeless tobacco: Never  Vaping Use   Vaping Use: Never used  Substance and Sexual Activity   Alcohol use: Yes    Comment: occ   Drug use: No   Sexual activity: Not on file  Other Topics Concern   Not on file  Social History Narrative   Not on file   Social Determinants of Health   Financial Resource Strain: Not on file  Food Insecurity: Not on file  Transportation Needs: Not on file  Physical Activity: Not on file  Stress: Not on file  Social Connections: Not on file  Intimate Partner Violence: Not on file     Review of Systems   Gen: Denies any fever, chills, fatigue, weight loss, lack of appetite.  CV: Denies chest pain, heart palpitations, peripheral edema, syncope.  Resp: Denies shortness of breath at rest or with exertion. Denies wheezing or cough.  GI: see HPI GU : Denies urinary burning, urinary frequency, urinary hesitancy MS: Denies joint pain, muscle weakness, cramps, or limitation of movement.  Derm: Denies rash, itching, dry  skin Psych: Denies depression, anxiety, memory loss, and confusion Heme: Denies bruising, bleeding, and enlarged lymph nodes.   Physical Exam   BP 121/81 (BP Location: Left Arm, Patient Position: Sitting, Cuff Size: Large)   Pulse 97   Temp 98.4 F (36.9 C) (Temporal)   Ht 5' 4"$  (1.626 m)   LMP 06/14/2011   BMI 24.79 kg/m   General:   Alert and oriented. Pleasant and cooperative. Well-nourished and well-developed.  Head:  Normocephalic and atraumatic. Eyes:  Without icterus, sclera clear and conjunctiva pink.  Ears:  Normal auditory acuity. Mouth:  No deformity or lesions, oral mucosa pink.  Lungs:  Clear to auscultation bilaterally. No wheezes, rales, or rhonchi. No distress.  Heart:  S1, S2 present without murmurs appreciated.  Abdomen:  +BS, soft, non-tender  and non-distended. No HSM noted. No guarding or rebound. No masses appreciated. Exam limited as patient in wheelchair.  Rectal:  Deferred  Msk: in wheelchair Extremities:  +2 edema up to knees bilaterally.  Neurologic:  Alert and  oriented x4;  grossly normal neurologically. Skin:  Intact. Scab to left shin.  Psych:  Alert and cooperative. Normal mood and affect.   Assessment   Annette Lucas is a 52 y.o. female with a history of meat allergy, GERD, IBS presenting today with elevated liver enzymes and weakness.  Elevated LFTs, weakness: Patient has been experiencing some mild pruritus with certain clothes, being more sensitive than she used to be.  She has denied any fever, chills, melena, Rifenbark, abdominal pain, unintentional weight loss, reflux, dysphagia, confusion or mental status changes.  She has been having some lower extremity weakness, neuropathy, and edema as well as fatigue.  Mother and patient also note bloating since her symptoms began as well as polydipsia without significant urination.  Currently on torsemide 20 mg twice daily.  Was previously on Lasix.  Currently needing potassium replacement.  Patient  does admit to occasional alcohol use, but not recently.  Per review of labs her hemoglobin has been stable, her ferritin has been slightly elevated however improving since December.  Does admit to occasional alcohol use.  Etiology unclear at this time as no documented history of hypertension, diabetes, or hyperlipidemia, possible viral illness as cause of symptoms however unable to rule out autoimmune etiology given her associated neurologic symptoms.  BMI within normal limits.  Will recheck HFP now including full serologic workup with viral hepatitis panel, CMV and EBV serologies, AMA, ASMA, alpha-1 antitrypsin, antiliver kidney antibody, and ceruloplasmin.  Previous workup has been negative for ANA.  Hemochromatosis labs have been checked however report not available for review, will request this.  For now advised to follow fatty liver diet, avoid alcohol.  Encouraged her to follow-up with primary care for management of diuretics and possibly add spironolactone given her +2 edema bilateral.  Also advised 2 g sodium diet.  No evidence of hepatomegaly or right upper quadrant tenderness on exam, however on limited given patient wheelchair and unable to get to exam table.   PLAN   HFP, Viral hepatitis panel, CMV and EBV serologies, AMA, ASMA, alpha 1 antitrypsin, anlk ab, ceruloplasmin Request hemachromatosis labs form PCP (no results in [facility] system) Fatty liver diet, avoid alcohol Follow up with PCP for diuretic management, may benefit from addition of spironolactone given symptomatic hypokalemia Use compression stockings 2g sodium diet Follow up in 2 months, sooner if needed   Venetia Night, MSN, FNP-BC, AGACNP-BC [facility]

## 2022-11-16 NOTE — Telephone Encounter (Signed)
Please request hemachromatosis labs from PCP. No report available in [facility] system for me to review. Thank you.   Venetia Night, MSN, APRN, FNP-BC, AGACNP-BC [facility]

## 2022-11-16 NOTE — Patient Instructions (Addendum)
Instructions for fatty liver: Recommend 1-2# weight loss per week until ideal body weight through exercise & diet. Low fat/cholesterol diet.   Avoid sweets, sodas, fruit juices, sweetened beverages like tea, etc. Gradually increase exercise from 15 min daily up to 1 hr per day 5 days/week. (When able) Limit alcohol use. Limit sodium intake to 2032m daily.   Continue to discuss diuretic management with your primary care provider.  As we discussed you may benefit from spironolactone.  Also for your swelling I would encourage you to use compression stockings to your lower extremities daily and remove nightly.  I am ordering lab work for you have completed to further evaluate your liver enzymes.  As we discussed your ultrasound is reassuring.  Please follow the above instructions regarding fatty liver.  I will request your hemochromatosis DNA testing from your primary care.  It was a pleasure to see you today. I want to create trusting relationships with patients. If you receive a survey regarding your visit,  I greatly appreciate you taking time to fill this out on paper or through your MyChart. I value your feedback.  CVenetia Night MSN, FNP-BC, AGACNP-BC [facility]

## 2022-11-17 LAB — CMV ABS, IGG+IGM (CYTOMEGALOVIRUS)
CMV IgM: <30 AU/mL
Cytomegalovirus Ab-IgG: <0.6 U/mL

## 2022-11-17 LAB — EPSTEIN-BARR VIRUS VCA ANTIBODY PANEL
EBV NA IgG: 204 U/mL — ABNORMAL HIGH
EBV VCA IgG: 195 U/mL — ABNORMAL HIGH
EBV VCA IgM: <36 U/mL

## 2022-11-18 ENCOUNTER — Encounter: Payer: Self-pay | Admitting: Gastroenterology

## 2022-11-19 ENCOUNTER — Telehealth: Payer: Self-pay

## 2022-11-19 NOTE — Telephone Encounter (Signed)
Lab results from outside provider scanned under media for review.

## 2022-11-20 LAB — HEPATIC FUNCTION PANEL
AG Ratio: 1 (calc) (ref 1.0–2.5)
ALT: 17 U/L (ref 6–29)
AST: 23 U/L (ref 10–35)
Albumin: 3.3 g/dL — ABNORMAL LOW (ref 3.6–5.1)
Alkaline phosphatase (APISO): 129 U/L (ref 37–153)
Bilirubin, Direct: 0.2 mg/dL (ref 0.0–0.2)
Globulin: 3.3 g/dL (calc) (ref 1.9–3.7)
Indirect Bilirubin: 0.5 mg/dL (calc) (ref 0.2–1.2)
Total Bilirubin: 0.7 mg/dL (ref 0.2–1.2)
Total Protein: 6.6 g/dL (ref 6.1–8.1)

## 2022-11-20 LAB — CERULOPLASMIN: Ceruloplasmin: 28 mg/dL (ref 18–53)

## 2022-11-20 LAB — ANTI-SMOOTH MUSCLE ANTIBODY, IGG: Actin (Smooth Muscle) Antibody (IGG): <20 U (ref <20)

## 2022-11-20 LAB — ANTI-MICROSOMAL ANTIBODY LIVER / KIDNEY: LKM1 Ab: <=20 U (ref <=20.0)

## 2022-11-20 LAB — HEPATITIS A ANTIBODY, IGM: Hep A IgM: NONREACTIVE

## 2022-11-20 LAB — HEPATITIS C ANTIBODY: Hepatitis C Ab: NONREACTIVE

## 2022-11-20 LAB — HEPATITIS B SURFACE ANTIGEN: Hepatitis B Surface Ag: NONREACTIVE

## 2022-11-20 LAB — MITOCHONDRIAL ANTIBODIES: Mitochondrial M2 Ab, IgG: <=20 U (ref <=20.0)

## 2022-11-20 LAB — ALPHA-1-ANTITRYPSIN: A-1 Antitrypsin, Ser: 155 mg/dL (ref 83–199)

## 2022-11-20 LAB — HEPATITIS B CORE ANTIBODY, TOTAL: Hep B Core Total Ab: NONREACTIVE

## 2022-11-22 NOTE — Telephone Encounter (Signed)
LMOM for pt to call office  

## 2022-11-22 NOTE — Telephone Encounter (Signed)
See previous note

## 2023-01-18 ENCOUNTER — Encounter: Payer: Self-pay | Admitting: Gastroenterology

## 2023-01-18 ENCOUNTER — Ambulatory Visit: Payer: Medicaid Other | Admitting: Gastroenterology

## 2023-01-18 NOTE — Progress Notes (Deleted)
GI Office Note    Referring Provider: Lianne Moris, PA-C Primary Care Physician:  Lianne Moris, PA-C Primary Gastroenterologist: Hennie Duos. Marletta Lor, DO  Date:  01/18/2023  ID:  Annette Lucas, DOB 1970/10/11, MRN [id number]   Chief Complaint   No chief complaint on file.  History of Present Illness  Annette Lucas is a 52 y.o. female with a history of *** presenting today with complaint of   Labs 09/13/22: WBC 12.6, AST 74, ALT 47, Alk Phos 188. Ehtanol level negative.    Labs 09/23/2022: Alk phos 213, AST 59, ALT 30, albumin 3.1, ferritin 913, B12 greater than 2000, WBC 12   Labs 10/08/2022: Albumin 3.2, alk phos 189, AST 43, ALT 31, creatinine 0.58, iron 69, ferritin 870, B12 1211, normal folate, hemoglobin 12.3, MCV 98   Labs 11/04/22: Iron normal, ferritin 781, ANA negative. HFE testing unavailable for review.    Abdominal ultrasound 10/14/2022: -Hepatomegaly with liver measuring 19 cm in overall length -Hepatic steatosis -No biliary or intrahepatic ductal dilation or lesions -No definite gallstones, wall thickening or pericholecystic fluid.  Questionable small amounts of sludge layering in the gallbladder -CBD measuring 4 mm -Spleen in upper limits of normal.   Initial consultation 11/16/2022. *** Complete serologic workup of elevated LFTs with HFP, viral hepatitis, CMV, EBV, autoimmune serologies, and ceruloplasmin.  Requested prior hemochromatosis labs.  Counseled on fatty liver diet and to avoid alcohol.  Advised use of compression stockings and follow low-sodium diet.  Hemochromatosis labs normal.  Labs 11/16/2022: Liver enzymes have normalized.  Autoimmune labs and viral hepatitis panel negative.  Also negative for CMV.  EBV panel reflected previous mono infection unable to determine at what point.  Today:   Current Outpatient Medications  Medication Sig Dispense Refill   folic acid (FOLVITE) 1 MG tablet Take 1 tablet (1 mg total) by mouth daily.     gabapentin  (NEURONTIN) 600 MG tablet Take 600 mg by mouth 4 (four) times daily.     ibuprofen (ADVIL,MOTRIN) 200 MG tablet Take 400 mg by mouth every 6 (six) hours as needed. For pain.      magnesium oxide (MAG-OX) 400 (241.3 Mg) MG tablet Take 1 tablet (400 mg total) by mouth daily. 30 tablet 0   milk thistle 175 MG tablet Take 175 mg by mouth daily.     pantoprazole (PROTONIX) 40 MG tablet Take 40 mg by mouth daily.     potassium chloride (KLOR-CON) 10 MEQ tablet Take 1 tablet (10 mEq total) by mouth daily. (Patient taking differently: Take 20 mEq by mouth daily.) 10 tablet 0   torsemide (DEMADEX) 20 MG tablet Take 20 mg by mouth 2 (two) times daily.     Turmeric (QC TUMERIC COMPLEX) 500 MG CAPS Take by mouth daily at 6 (six) AM.     vitamin B-12 (CYANOCOBALAMIN) 500 MCG tablet Take 1 tablet (500 mcg total) by mouth daily. (Patient taking differently: Take 5,000 mcg by mouth daily.)     No current facility-administered medications for this visit.    Past Medical History:  Diagnosis Date   Allergy to meat    GERD (gastroesophageal reflux disease)    IBS (irritable bowel syndrome)     Past Surgical History:  Procedure Laterality Date   CYSTOSCOPY  07/06/2011   Procedure: CYSTOSCOPY;  Surgeon: Zenaida Niece, MD;  Location: [facility];  Service: Gynecology;  Laterality: N/A;   ENDOMETRIAL ABLATION     LAPAROSCOPIC SALPINGO OOPHERECTOMY Bilateral 09/01/2016   Procedure: LAPAROSCOPIC SALPINGO OOPHORECTOMY;  Surgeon: Lavina Hamman, MD;  Location: [facility];  Service: Gynecology;  Laterality: Bilateral;   VAGINAL HYSTERECTOMY  07/06/2011   Procedure: HYSTERECTOMY VAGINAL;  Surgeon: Zenaida Niece, MD;  Location: [facility];  Service: Gynecology;  Laterality: N/A;    No family history on file.  Allergies as of 01/18/2023 - Review Complete 11/16/2022  Allergen Reaction Noted   Levofloxacin Anaphylaxis 06/22/2011   Other Anaphylaxis 08/23/2016   Penicillins Anaphylaxis 06/22/2011    Social History    Socioeconomic History   Marital status: Married    Spouse name: Not on file   Number of children: Not on file   Years of education: Not on file   Highest education level: Not on file  Occupational History   Not on file  Tobacco Use   Smoking status: Never   Smokeless tobacco: Never  Vaping Use   Vaping Use: Never used  Substance and Sexual Activity   Alcohol use: Yes    Comment: occ   Drug use: No   Sexual activity: Not on file  Other Topics Concern   Not on file  Social History Narrative   Not on file   Social Determinants of Health   Financial Resource Strain: Not on file  Food Insecurity: Not on file  Transportation Needs: Not on file  Physical Activity: Not on file  Stress: Not on file  Social Connections: Not on file     Review of Systems   Gen: Denies fever, chills, anorexia. Denies fatigue, weakness, weight loss.  CV: Denies chest pain, palpitations, syncope, peripheral edema, and claudication. Resp: Denies dyspnea at rest, cough, wheezing, coughing up blood, and pleurisy. GI: See HPI Derm: Denies rash, itching, dry skin Psych: Denies depression, anxiety, memory loss, confusion. No homicidal or suicidal ideation.  Heme: Denies bruising, bleeding, and enlarged lymph nodes.   Physical Exam   LMP 06/14/2011   General:   Alert and oriented. No distress noted. Pleasant and cooperative.  Head:  Normocephalic and atraumatic. Eyes:  Conjuctiva clear without scleral icterus. Mouth:  Oral mucosa pink and moist. Good dentition. No lesions. Lungs:  Clear to auscultation bilaterally. No wheezes, rales, or rhonchi. No distress.  Heart:  S1, S2 present without murmurs appreciated.  Abdomen:  +BS, soft, non-tender and non-distended. No rebound or guarding. No HSM or masses noted. Rectal: *** Msk:  Symmetrical without gross deformities. Normal posture. Extremities:  Without edema. Neurologic:  Alert and  oriented x4 Psych:  Alert and cooperative. Normal mood and  affect.   Assessment  Annette Lucas is a 52 y.o. female with a history of knee allergy, GERD, IBS, and elevated liver enzymes presenting today for follow-up of elevated liver enzymes and weakness.  Elevated LFTs:  Weakness:  PLAN   ***     Brooke Bonito, MSN, FNP-BC, AGACNP-BC [facility]

## 2023-12-20 ENCOUNTER — Emergency Department (HOSPITAL_COMMUNITY)

## 2023-12-20 ENCOUNTER — Other Ambulatory Visit: Payer: Self-pay

## 2023-12-20 ENCOUNTER — Inpatient Hospital Stay (HOSPITAL_COMMUNITY)
Admission: EM | Admit: 2023-12-20 | Discharge: 2023-12-24 | DRG: 493 | Disposition: A | Discharge status: Rehabilitation Facility | Attending: Internal Medicine | Admitting: Internal Medicine

## 2023-12-20 ENCOUNTER — Ambulatory Visit: Payer: Self-pay | Admitting: Student

## 2023-12-20 ENCOUNTER — Encounter (HOSPITAL_COMMUNITY): Payer: Self-pay

## 2023-12-20 DIAGNOSIS — Z23 Encounter for immunization: Secondary | ICD-10-CM

## 2023-12-20 DIAGNOSIS — W010XXA Fall on same level from slipping, tripping and stumbling without subsequent striking against object, initial encounter: Secondary | ICD-10-CM | POA: Diagnosis present

## 2023-12-20 DIAGNOSIS — S82891A Other fracture of right lower leg, initial encounter for closed fracture: Secondary | ICD-10-CM | POA: Diagnosis not present

## 2023-12-20 DIAGNOSIS — S82301A Unspecified fracture of lower end of right tibia, initial encounter for closed fracture: Principal | ICD-10-CM | POA: Diagnosis present

## 2023-12-20 DIAGNOSIS — S82231B Displaced oblique fracture of shaft of right tibia, initial encounter for open fracture type I or II: Principal | ICD-10-CM | POA: Diagnosis present

## 2023-12-20 DIAGNOSIS — Y92002 Bathroom of unspecified non-institutional (private) residence single-family (private) house as the place of occurrence of the external cause: Secondary | ICD-10-CM

## 2023-12-20 DIAGNOSIS — S82201A Unspecified fracture of shaft of right tibia, initial encounter for closed fracture: Principal | ICD-10-CM | POA: Diagnosis present

## 2023-12-20 DIAGNOSIS — S82831B Other fracture of upper and lower end of right fibula, initial encounter for open fracture type I or II: Secondary | ICD-10-CM | POA: Diagnosis present

## 2023-12-20 DIAGNOSIS — G629 Polyneuropathy, unspecified: Secondary | ICD-10-CM

## 2023-12-20 DIAGNOSIS — Z79899 Other long term (current) drug therapy: Secondary | ICD-10-CM

## 2023-12-20 DIAGNOSIS — Z880 Allergy status to penicillin: Secondary | ICD-10-CM

## 2023-12-20 DIAGNOSIS — Z8673 Personal history of transient ischemic attack (TIA), and cerebral infarction without residual deficits: Secondary | ICD-10-CM

## 2023-12-20 DIAGNOSIS — K589 Irritable bowel syndrome without diarrhea: Secondary | ICD-10-CM | POA: Diagnosis present

## 2023-12-20 DIAGNOSIS — S82899A Other fracture of unspecified lower leg, initial encounter for closed fracture: Secondary | ICD-10-CM | POA: Diagnosis present

## 2023-12-20 DIAGNOSIS — Z881 Allergy status to other antibiotic agents status: Secondary | ICD-10-CM

## 2023-12-20 DIAGNOSIS — K219 Gastro-esophageal reflux disease without esophagitis: Secondary | ICD-10-CM | POA: Diagnosis present

## 2023-12-20 DIAGNOSIS — D62 Acute posthemorrhagic anemia: Secondary | ICD-10-CM | POA: Diagnosis not present

## 2023-12-20 DIAGNOSIS — Z888 Allergy status to other drugs, medicaments and biological substances status: Secondary | ICD-10-CM

## 2023-12-20 DIAGNOSIS — E876 Hypokalemia: Secondary | ICD-10-CM | POA: Diagnosis present

## 2023-12-20 DIAGNOSIS — W19XXXA Unspecified fall, initial encounter: Secondary | ICD-10-CM

## 2023-12-20 DIAGNOSIS — F419 Anxiety disorder, unspecified: Secondary | ICD-10-CM | POA: Insufficient documentation

## 2023-12-20 DIAGNOSIS — S82891B Other fracture of right lower leg, initial encounter for open fracture type I or II: Principal | ICD-10-CM

## 2023-12-20 HISTORY — DX: Cerebral infarction, unspecified: I63.9

## 2023-12-20 HISTORY — DX: Polyneuropathy, unspecified: G62.9

## 2023-12-20 HISTORY — DX: Essential (primary) hypertension: I10

## 2023-12-20 LAB — CBC WITH DIFFERENTIAL/PLATELET
Abs Immature Granulocytes: 0.04 10*3/uL (ref 0.00–0.07)
Basophils Absolute: 0.1 10*3/uL (ref 0.0–0.1)
Basophils Relative: 1 %
Eosinophils Absolute: 0 10*3/uL (ref 0.0–0.5)
Eosinophils Relative: 0 %
HCT: 36.9 % (ref 36.0–46.0)
Hemoglobin: 12.7 g/dL (ref 12.0–15.0)
Immature Granulocytes: 0 %
Lymphocytes Relative: 15 %
Lymphs Abs: 1.5 10*3/uL (ref 0.7–4.0)
MCH: 33.7 pg (ref 26.0–34.0)
MCHC: 34.4 g/dL (ref 30.0–36.0)
MCV: 97.9 fL (ref 80.0–100.0)
Monocytes Absolute: 0.5 10*3/uL (ref 0.1–1.0)
Monocytes Relative: 5 %
Neutro Abs: 7.6 10*3/uL (ref 1.7–7.7)
Neutrophils Relative %: 79 %
Platelets: 227 10*3/uL (ref 150–400)
RBC: 3.77 MIL/uL — ABNORMAL LOW (ref 3.87–5.11)
RDW: 15.2 % (ref 11.5–15.5)
WBC: 9.7 10*3/uL (ref 4.0–10.5)
nRBC: 0 % (ref 0.0–0.2)

## 2023-12-20 LAB — BASIC METABOLIC PANEL
Anion gap: 10 (ref 5–15)
BUN: <5 mg/dL — ABNORMAL LOW (ref 6–20)
CO2: 22 mmol/L (ref 22–32)
Calcium: 8.2 mg/dL — ABNORMAL LOW (ref 8.9–10.3)
Chloride: 113 mmol/L — ABNORMAL HIGH (ref 98–111)
Creatinine, Ser: 0.44 mg/dL (ref 0.44–1.00)
GFR, Estimated: >60 mL/min (ref >60)
Glucose, Bld: 87 mg/dL (ref 70–99)
Potassium: 3.1 mmol/L — ABNORMAL LOW (ref 3.5–5.1)
Sodium: 145 mmol/L (ref 135–145)

## 2023-12-20 LAB — PROTIME-INR
INR: 1 (ref 0.8–1.2)
Prothrombin Time: 13.1 s (ref 11.4–15.2)

## 2023-12-20 MED ORDER — PANTOPRAZOLE SODIUM 40 MG PO TBEC: 40.0000 mg | DELAYED_RELEASE_TABLET | Freq: Every day | ORAL | Status: DC

## 2023-12-20 MED ORDER — PROPOFOL 10 MG/ML IV BOLUS
0.5000 mg/kg | Freq: Once | INTRAVENOUS | Status: AC
  Administered 2023-12-20: 30.6 mg via INTRAVENOUS
  Filled 2023-12-20: qty 20

## 2023-12-20 MED ORDER — OXYCODONE HCL 5 MG PO TABS
5.0000 mg | ORAL_TABLET | Freq: Four times a day (QID) | ORAL | Status: DC | PRN
  Administered 2023-12-21: 5 mg via ORAL
  Filled 2023-12-20: qty 1

## 2023-12-20 MED ORDER — HYDROMORPHONE HCL 1 MG/ML IJ SOLN
0.5000 mg | INTRAMUSCULAR | Status: DC | PRN
  Administered 2023-12-20 – 2023-12-21 (×2): 0.5 mg via INTRAVENOUS
  Filled 2023-12-20 (×2): qty 0.5

## 2023-12-20 MED ORDER — GABAPENTIN 300 MG PO CAPS
300.0000 mg | ORAL_CAPSULE | Freq: Three times a day (TID) | ORAL | Status: DC
  Administered 2023-12-21 – 2023-12-24 (×10): 300 mg via ORAL
  Filled 2023-12-20 (×10): qty 1

## 2023-12-20 MED ORDER — CLINDAMYCIN PHOSPHATE 900 MG/50ML IV SOLN
900.0000 mg | INTRAVENOUS | Status: AC
  Administered 2023-12-20: 900 mg via INTRAVENOUS
  Filled 2023-12-20: qty 50

## 2023-12-20 MED ORDER — ACETAMINOPHEN 325 MG PO TABS: 650.0000 mg | ORAL_TABLET | Freq: Four times a day (QID) | ORAL | Status: DC | PRN

## 2023-12-20 MED ORDER — FOLIC ACID 1 MG PO TABS
1.0000 mg | ORAL_TABLET | Freq: Every day | ORAL | Status: DC
  Administered 2023-12-21 – 2023-12-24 (×4): 1 mg via ORAL
  Filled 2023-12-20 (×4): qty 1

## 2023-12-20 MED ORDER — BUSPIRONE HCL 5 MG PO TABS
5.0000 mg | ORAL_TABLET | Freq: Two times a day (BID) | ORAL | Status: DC
  Administered 2023-12-21 – 2023-12-24 (×7): 5 mg via ORAL
  Filled 2023-12-20 (×7): qty 1

## 2023-12-20 MED ORDER — POTASSIUM CHLORIDE 2 MEQ/ML IV SOLN
INTRAVENOUS | Status: AC
Start: 2023-12-20 — End: 2023-12-21
  Filled 2023-12-20 (×2): qty 1000

## 2023-12-20 MED ORDER — TETANUS-DIPHTH-ACELL PERTUSSIS 5-2.5-18.5 LF-MCG/0.5 IM SUSY
0.5000 mL | PREFILLED_SYRINGE | Freq: Once | INTRAMUSCULAR | Status: AC
  Administered 2023-12-20: 0.5 mL via INTRAMUSCULAR
  Filled 2023-12-20: qty 0.5

## 2023-12-20 MED ORDER — POLYETHYLENE GLYCOL 3350 17 G PO PACK
17.0000 g | PACK | Freq: Every day | ORAL | Status: DC | PRN
  Administered 2023-12-23: 17 g via ORAL
  Filled 2023-12-20: qty 1

## 2023-12-20 MED ORDER — MELATONIN 5 MG PO TABS: 5.0000 mg | ORAL_TABLET | Freq: Every evening | ORAL | Status: DC | PRN

## 2023-12-20 MED ORDER — PROCHLORPERAZINE EDISYLATE 10 MG/2ML IJ SOLN: 5.0000 mg | Freq: Four times a day (QID) | INTRAMUSCULAR | Status: DC | PRN

## 2023-12-20 MED ORDER — HYDROMORPHONE HCL 1 MG/ML IJ SOLN
0.5000 mg | INTRAMUSCULAR | Status: AC
  Administered 2023-12-20: 0.5 mg via INTRAVENOUS
  Filled 2023-12-20: qty 1

## 2023-12-20 MED ORDER — HYDROMORPHONE HCL 1 MG/ML IJ SOLN
1.0000 mg | Freq: Once | INTRAMUSCULAR | Status: AC
  Administered 2023-12-20: 1 mg via INTRAVENOUS
  Filled 2023-12-20: qty 1

## 2023-12-20 MED ORDER — POTASSIUM CHLORIDE CRYS ER 20 MEQ PO TBCR
40.0000 meq | EXTENDED_RELEASE_TABLET | ORAL | Status: AC
  Administered 2023-12-20: 20 meq via ORAL
  Filled 2023-12-20: qty 2

## 2023-12-20 NOTE — Progress Notes (Addendum)
 Consult received from EDP for open right distal tib/fib fracture. In the ED patient received IV clindamycin and tetanus. Patient had bed side irrigation, closed reduction, and splinting. Patient will need to go to the OR tomorrow. NPO after midnight. Hold chemical DVT ppx. Full consult to follow.

## 2023-12-20 NOTE — ED Triage Notes (Signed)
 Patient was walking and hit right ankle on a door frame and cut it open. Per EMS they lost pulses in right foot 1731. Pulses found by this RN at 1831 at rate of 95. Patient has history of neuropathy    178fent and 500 bolus given by EMS

## 2023-12-20 NOTE — Progress Notes (Signed)
 Orthopedic Tech Progress Note Patient Details:  Annette Lucas [birth year] [id number]  Ortho Devices Type of Ortho Device: Stirrup splint, Post (short leg) splint Ortho Device/Splint Location: RLE Ortho Device/Splint Interventions: Ordered, Adjustment, Application   Post Interventions Patient Tolerated: Poor Splint applied following wound wash out during conscious sedation. Darleen Crocker 12/20/2023, 8:11 PM

## 2023-12-20 NOTE — ED Provider Notes (Signed)
 [place] EMERGENCY DEPARTMENT AT [facility] Provider Note   CSN: [id number] Arrival date & time: 12/20/23  1829     History {Add pertinent medical, surgical, social history, OB history to HPI:1} Chief Complaint  Patient presents with   Ankle Injury    Annette Lucas is a 53 y.o. female.  She has a history of colitis, neuropathy of hands and feet.  She said about 2 hours ago she was cleaning the bathroom when she slipped and twisted her ankle, hit it into the door frame.  Since then she has had significant pain and bleeding.  She is not on a blood thinner.  The history is provided by the patient and the EMS personnel.  Ankle Injury This is a new problem. The current episode started 1 to 2 hours ago. The problem occurs constantly. The problem has not changed since onset.Pertinent negatives include no chest pain, no abdominal pain, no headaches and no shortness of breath. The symptoms are aggravated by bending. Nothing relieves the symptoms. She has tried rest for the symptoms. The treatment provided no relief.       Home Medications Prior to Admission medications   Medication Sig Start Date End Date Taking? Authorizing Provider  folic acid (FOLVITE) 1 MG tablet Take 1 tablet (1 mg total) by mouth daily. 01/18/20   Catarina Hartshorn, MD  gabapentin (NEURONTIN) 600 MG tablet Take 600 mg by mouth 4 (four) times daily.    [provider]  ibuprofen (ADVIL,MOTRIN) 200 MG tablet Take 400 mg by mouth every 6 (six) hours as needed. For pain.     [provider]  magnesium oxide (MAG-OX) 400 (241.3 Mg) MG tablet Take 1 tablet (400 mg total) by mouth daily. 01/19/20   Catarina Hartshorn, MD  milk thistle 175 MG tablet Take 175 mg by mouth daily.    [provider]  pantoprazole (PROTONIX) 40 MG tablet Take 40 mg by mouth daily.    [provider]  potassium chloride (KLOR-CON) 10 MEQ tablet Take 1 tablet (10 mEq total) by mouth daily. Patient taking differently:  Take 20 mEq by mouth daily. 09/13/22   Terrilee Files, MD  torsemide (DEMADEX) 20 MG tablet Take 20 mg by mouth 2 (two) times daily.    [provider]  Turmeric (QC TUMERIC COMPLEX) 500 MG CAPS Take by mouth daily at 6 (six) AM.    [provider]  vitamin B-12 (CYANOCOBALAMIN) 500 MCG tablet Take 1 tablet (500 mcg total) by mouth daily. Patient taking differently: Take 5,000 mcg by mouth daily. 01/18/20   Catarina Hartshorn, MD      Allergies    Levofloxacin, Other, and Penicillins    Review of Systems   Review of Systems  Respiratory:  Negative for shortness of breath.   Cardiovascular:  Negative for chest pain.  Gastrointestinal:  Negative for abdominal pain.  Neurological:  Negative for headaches.    Physical Exam Updated Vital Signs BP 136/88 (BP Location: Right Arm)   Pulse (!) 103   Temp 98.2 F (36.8 C) (Oral)   Resp 18   Ht 5\' 4"  (1.626 m)   Wt 61.2 kg   LMP 06/14/2011   SpO2 100%   BMI 23.17 kg/m  Physical Exam Vitals and nursing note reviewed.  Constitutional:      General: She is not in acute distress.    Appearance: Normal appearance. She is well-developed.  HENT:     Head: Normocephalic and atraumatic.  Eyes:  Conjunctiva/sclera: Conjunctivae normal.  Cardiovascular:     Rate and Rhythm: Regular rhythm. Tachycardia present.     Heart sounds: No murmur heard. Pulmonary:     Effort: Pulmonary effort is normal. No respiratory distress.     Breath sounds: Normal breath sounds.  Abdominal:     Palpations: Abdomen is soft.     Tenderness: There is no abdominal tenderness.  Musculoskeletal:        General: Tenderness, deformity and signs of injury present.     Cervical back: Neck supple.     Comments: Her right ankle has a large laceration across the medial aspect.  Distal pulses are intact.  Diminished sensation of both feet symmetric.  She is able to wiggle her toes.  Skin:    General: Skin is warm and dry.     Capillary Refill:  Capillary refill takes less than 2 seconds.  Neurological:     Mental Status: She is alert.     ED Results / Procedures / Treatments   Labs (all labs ordered are listed, but only abnormal results are displayed) Labs Reviewed  BASIC METABOLIC PANEL  CBC WITH DIFFERENTIAL/PLATELET  PROTIME-INR    EKG None  Radiology No results found.  Procedures Procedures  {Document cardiac monitor, telemetry assessment procedure when appropriate:1}  Medications Ordered in ED Medications  HYDROmorphone (DILAUDID) injection 1 mg (has no administration in time range)  clindamycin (CLEOCIN) IVPB 900 mg (has no administration in time range)  Tdap (BOOSTRIX) injection 0.5 mL (has no administration in time range)    ED Course/ Medical Decision Making/ A&P   {   Click here for ABCD2, HEART and other calculatorsREFRESH Note before signing :1}                              Medical Decision Making Amount and/or Complexity of Data Reviewed Labs: ordered. Radiology: ordered.  Risk Prescription drug management.   ***  {Document critical care time when appropriate:1} {Document review of labs and clinical decision tools ie heart score, Chads2Vasc2 etc:1}  {Document your independent review of radiology images, and any outside records:1} {Document your discussion with family members, caretakers, and with consultants:1} {Document social determinants of health affecting pt's care:1} {Document your decision making why or why not admission, treatments were needed:1} Final Clinical Impression(s) / ED Diagnoses Final diagnoses:  None    Rx / DC Orders ED Discharge Orders     None

## 2023-12-20 NOTE — H&P (Incomplete)
 History and Physical  Puneet Selden ZOX:[id number] DOB: 01-01-1971 DOA: 12/20/2023  Referring physician: Dr. Charm Barges, EDP  PCP: Lianne Moris, PA-C  Outpatient Specialists: None Patient coming from: Home  Chief Complaint: Fall, right ankle pain.  HPI: Annette Lucas is a 53 y.o. female with medical history significant for peripheral neuropathy on gabapentin, GERD, who presents to the ER after a fall at home.  She slipped in the bathroom, twisting her right ankle and hitting it into a door frame.  States she was in her usual state of health prior to this.  EMS was activated.  She had significant pain and bleeding.  She was brought into the ER for further evaluation.  Not on prescribed blood thinners.  In the ER, right ankle x-ray showed moderately angulated and comminuted distal right tibia and fibula fractures.  EDP discussed the case with orthopedic surgery Dr. Linna Caprice.  Recommended admission to medicine team with plan for surgical repair tomorrow 12/21/2023.  Admitted by [facility], hospitalist service.  Pain control in place.  N.p.o. after midnight.  ED Course: Temperature 9.4.  BP 130/75, pulse 98, respiratory 16, O2 saturation 95% on room air.  Lab work remarkable for serum potassium 3.1, repleted.  Review of Systems: Review of systems as noted in the HPI. All other systems reviewed and are negative.   Past Medical History:  Diagnosis Date   Allergy to meat    GERD (gastroesophageal reflux disease)    IBS (irritable bowel syndrome)    Past Surgical History:  Procedure Laterality Date   CYSTOSCOPY  07/06/2011   Procedure: CYSTOSCOPY;  Surgeon: Zenaida Niece, MD;  Location: [facility];  Service: Gynecology;  Laterality: N/A;   ENDOMETRIAL ABLATION     LAPAROSCOPIC SALPINGO OOPHERECTOMY Bilateral 09/01/2016   Procedure: LAPAROSCOPIC SALPINGO OOPHORECTOMY;  Surgeon: Lavina Hamman, MD;  Location: [facility];  Service: Gynecology;  Laterality: Bilateral;   VAGINAL HYSTERECTOMY  07/06/2011    Procedure: HYSTERECTOMY VAGINAL;  Surgeon: Zenaida Niece, MD;  Location: [facility];  Service: Gynecology;  Laterality: N/A;    Social History:  reports that she has never smoked. She has never used smokeless tobacco. She reports current alcohol use. She reports that she does not use drugs.   Allergies  Allergen Reactions   Levofloxacin Anaphylaxis   Other Anaphylaxis    Meat allergy: Beef, pork, lamb   Penicillins Anaphylaxis    Has patient had a PCN reaction causing immediate rash, facial/tongue/throat swelling, SOB or lightheadedness with hypotension: Yes Has patient had a PCN reaction causing severe rash involving mucus membranes or skin necrosis: No Has patient had a PCN reaction that required hospitalization Yes Has patient had a PCN reaction occurring within the last 10 years: No If all of the above answers are "NO", then may proceed with Cephalosporin use.     Family history: None reported.   Prior to Admission medications   Medication Sig Start Date End Date Taking? Authorizing Provider  ASHWAGANDHA GUMMIES PO Take 2 each by mouth in the morning.   Yes [provider]  busPIRone (BUSPAR) 5 MG tablet Take 5 mg by mouth 2 (two) times daily.   Yes [provider]  folic acid (FOLVITE) 1 MG tablet Take 1 tablet (1 mg total) by mouth daily. Patient taking differently: Take 1 mg by mouth in the morning. 01/18/20  Yes Tat, Onalee Hua, MD  gabapentin (NEURONTIN) 600 MG tablet Take 600 mg by mouth 4 (four) times daily.   Yes [provider]  ibuprofen (ADVIL,MOTRIN)  200 MG tablet Take 400 mg by mouth every 6 (six) hours as needed. For pain.    Yes [provider]  magnesium oxide (MAG-OX) 400 (241.3 Mg) MG tablet Take 1 tablet (400 mg total) by mouth daily. Patient taking differently: Take 400 mg by mouth in the morning. 01/19/20  Yes Tat, Onalee Hua, MD  milk thistle 175 MG tablet Take 175 mg by mouth in the morning.   Yes [provider]   pantoprazole (PROTONIX) 40 MG tablet Take 40 mg by mouth in the morning.   Yes [provider]  potassium chloride (KLOR-CON) 10 MEQ tablet Take 1 tablet (10 mEq total) by mouth daily. Patient taking differently: Take 20 mEq by mouth in the morning. 09/13/22  Yes Terrilee Files, MD  torsemide (DEMADEX) 20 MG tablet Take 20 mg by mouth 2 (two) times daily.   Yes [provider]  Turmeric (QC TUMERIC COMPLEX) 500 MG CAPS Take 500 mg by mouth in the morning.   Yes [provider]  vitamin B-12 (CYANOCOBALAMIN) 500 MCG tablet Take 1 tablet (500 mcg total) by mouth daily. Patient taking differently: Take 5,000 mcg by mouth in the morning. 01/18/20  Yes TatOnalee Hua, MD    Physical Exam: BP (!) 145/93   Pulse (!) 110   Temp 98.6 F (37 C) (Oral)   Resp 20   Ht 5\' 4"  (1.626 m)   Wt 61.2 kg   LMP 06/14/2011   SpO2 100%   BMI 23.17 kg/m   General: 53 y.o. year-old female well developed well nourished in no acute distress.  Alert and oriented x3. Cardiovascular: Regular rate and rhythm with no rubs or gallops.  No thyromegaly or JVD noted.  No lower extremity edema. 2/4 pulses in all 4 extremities. Respiratory: Clear to auscultation with no wheezes or rales. Good inspiratory effort. Abdomen: Soft nontender nondistended with normal bowel sounds x4 quadrants. Muskuloskeletal: No cyanosis, clubbing or edema noted bilaterally.  Right ankle in stabilizer splint. Neuro: CN II-XII intact, strength, sensation, reflexes Skin: No ulcerative lesions noted or rashes Psychiatry: Judgement and insight appear normal. Mood is appropriate for condition and setting          Labs on Admission:  Basic Metabolic Panel: Recent Labs  Lab 12/20/23 1933  NA 145  K 3.1*  CL 113*  CO2 22  GLUCOSE 87  BUN <5*  CREATININE 0.44  CALCIUM 8.2*   Liver Function Tests: No results for input(s): "AST", "ALT", "ALKPHOS", "BILITOT", "PROT", "ALBUMIN" in the last 168 hours. No results  for input(s): "LIPASE", "AMYLASE" in the last 168 hours. No results for input(s): "AMMONIA" in the last 168 hours. CBC: Recent Labs  Lab 12/20/23 1933  WBC 9.7  NEUTROABS 7.6  HGB 12.7  HCT 36.9  MCV 97.9  PLT 227   Cardiac Enzymes: No results for input(s): "CKTOTAL", "CKMB", "CKMBINDEX", "TROPONINI" in the last 168 hours.  BNP (last 3 results) No results for input(s): "BNP" in the last 8760 hours.  ProBNP (last 3 results) No results for input(s): "PROBNP" in the last 8760 hours.  CBG: No results for input(s): "GLUCAP" in the last 168 hours.  Radiological Exams on Admission: DG Ankle Complete Right Result Date: 12/20/2023 CLINICAL DATA:  Status post reduction of right ankle fractures. EXAM: RIGHT ANKLE - COMPLETE 3+ VIEW COMPARISON:  Same day. FINDINGS: The right ankle has been casted and immobilized. There is continued presence of moderately angulated and comminuted fractures involving the distal right tibia and fibula.  IMPRESSION: Moderately angulated and comminuted distal right tibial and fibular fractures. Electronically Signed   By: Lupita Raider M.D.   On: 12/20/2023 20:27   DG Ankle Right Port Result Date: 12/20/2023 CLINICAL DATA:  Right ankle laceration after injury. EXAM: PORTABLE RIGHT ANKLE - 2 VIEW COMPARISON:  None Available. FINDINGS: Only a single lateral image was obtained. This demonstrates severely displaced fractures involving the distal right tibia and fibula. IMPRESSION: Severely displaced distal right tibial and fibular fractures. Electronically Signed   By: Lupita Raider M.D.   On: 12/20/2023 19:10    EKG: I independently viewed the EKG done and my findings are as followed: None available at the time of this visit.  Assessment/Plan Present on Admission:  (Resolved) Right tibial fracture  Ankle fracture  Active Problems:   Ankle fracture  Right ankle fracture, post mechanical fall, POA Plan for surgery on 12/21/2023 Appreciate orthopedic surgery  assistance Continue pain control and bowel regimen Gentle IV fluid hydration N.p.o. after midnight  Peripheral neuropathy Resume home gabapentin  Chronic anxiety Resume home BuSpar  GERD States she takes Protonix as needed Will hold off Protonix since prescription was to cover until January 2025.   Time: 75 minutes.   DVT prophylaxis: SCDs.  Defer chemical DVT chemical prophylaxis to orthopedic surgery.  Code Status: Full code.  Family Communication: Family members at bedside.  Disposition Plan: Admitted to MedSurg unit.  Consults called: Orthopedic surgery consulted by EDP.  Admission status: Observation status.   Status is: Observation    Darlin Drop MD [facility] Pager [phone]  If 7PM-7AM, please contact night-coverage [email or web address] Password [facility]  12/20/2023, 8:46 PM

## 2023-12-21 ENCOUNTER — Observation Stay (HOSPITAL_COMMUNITY): Admitting: Certified Registered Nurse Anesthetist

## 2023-12-21 ENCOUNTER — Other Ambulatory Visit: Payer: Self-pay

## 2023-12-21 ENCOUNTER — Inpatient Hospital Stay (HOSPITAL_COMMUNITY)

## 2023-12-21 ENCOUNTER — Observation Stay (HOSPITAL_COMMUNITY)

## 2023-12-21 ENCOUNTER — Encounter (HOSPITAL_COMMUNITY)
Admission: EM | Disposition: A | Discharge status: Rehabilitation Facility | Payer: Self-pay | Source: Home / Self Care | Attending: Internal Medicine

## 2023-12-21 ENCOUNTER — Encounter (HOSPITAL_COMMUNITY): Payer: Self-pay | Admitting: Internal Medicine

## 2023-12-21 DIAGNOSIS — D62 Acute posthemorrhagic anemia: Secondary | ICD-10-CM | POA: Diagnosis not present

## 2023-12-21 DIAGNOSIS — S82201B Unspecified fracture of shaft of right tibia, initial encounter for open fracture type I or II: Secondary | ICD-10-CM

## 2023-12-21 DIAGNOSIS — E876 Hypokalemia: Secondary | ICD-10-CM | POA: Diagnosis present

## 2023-12-21 DIAGNOSIS — S82201S Unspecified fracture of shaft of right tibia, sequela: Secondary | ICD-10-CM | POA: Diagnosis not present

## 2023-12-21 DIAGNOSIS — Z880 Allergy status to penicillin: Secondary | ICD-10-CM | POA: Diagnosis not present

## 2023-12-21 DIAGNOSIS — K219 Gastro-esophageal reflux disease without esophagitis: Secondary | ICD-10-CM | POA: Diagnosis present

## 2023-12-21 DIAGNOSIS — S82301A Unspecified fracture of lower end of right tibia, initial encounter for closed fracture: Secondary | ICD-10-CM | POA: Diagnosis present

## 2023-12-21 DIAGNOSIS — S82251A Displaced comminuted fracture of shaft of right tibia, initial encounter for closed fracture: Secondary | ICD-10-CM

## 2023-12-21 DIAGNOSIS — I10 Essential (primary) hypertension: Secondary | ICD-10-CM | POA: Diagnosis not present

## 2023-12-21 DIAGNOSIS — Z23 Encounter for immunization: Secondary | ICD-10-CM | POA: Diagnosis not present

## 2023-12-21 DIAGNOSIS — F419 Anxiety disorder, unspecified: Secondary | ICD-10-CM | POA: Diagnosis present

## 2023-12-21 DIAGNOSIS — S82201D Unspecified fracture of shaft of right tibia, subsequent encounter for closed fracture with routine healing: Secondary | ICD-10-CM | POA: Diagnosis not present

## 2023-12-21 DIAGNOSIS — K5900 Constipation, unspecified: Secondary | ICD-10-CM | POA: Diagnosis not present

## 2023-12-21 DIAGNOSIS — W010XXA Fall on same level from slipping, tripping and stumbling without subsequent striking against object, initial encounter: Secondary | ICD-10-CM | POA: Diagnosis present

## 2023-12-21 DIAGNOSIS — Z79899 Other long term (current) drug therapy: Secondary | ICD-10-CM | POA: Diagnosis not present

## 2023-12-21 DIAGNOSIS — S82401S Unspecified fracture of shaft of right fibula, sequela: Secondary | ICD-10-CM | POA: Diagnosis not present

## 2023-12-21 DIAGNOSIS — G629 Polyneuropathy, unspecified: Secondary | ICD-10-CM

## 2023-12-21 DIAGNOSIS — M792 Neuralgia and neuritis, unspecified: Secondary | ICD-10-CM | POA: Diagnosis not present

## 2023-12-21 DIAGNOSIS — Y92002 Bathroom of unspecified non-institutional (private) residence single-family (private) house as the place of occurrence of the external cause: Secondary | ICD-10-CM | POA: Diagnosis not present

## 2023-12-21 DIAGNOSIS — Z888 Allergy status to other drugs, medicaments and biological substances status: Secondary | ICD-10-CM | POA: Diagnosis not present

## 2023-12-21 DIAGNOSIS — M79604 Pain in right leg: Secondary | ICD-10-CM | POA: Diagnosis not present

## 2023-12-21 DIAGNOSIS — K589 Irritable bowel syndrome without diarrhea: Secondary | ICD-10-CM | POA: Diagnosis present

## 2023-12-21 DIAGNOSIS — S82301B Unspecified fracture of lower end of right tibia, initial encounter for open fracture type I or II: Secondary | ICD-10-CM | POA: Diagnosis present

## 2023-12-21 DIAGNOSIS — S82891A Other fracture of right lower leg, initial encounter for closed fracture: Secondary | ICD-10-CM | POA: Diagnosis present

## 2023-12-21 DIAGNOSIS — S82831B Other fracture of upper and lower end of right fibula, initial encounter for open fracture type I or II: Secondary | ICD-10-CM | POA: Diagnosis present

## 2023-12-21 DIAGNOSIS — S82891B Other fracture of right lower leg, initial encounter for open fracture type I or II: Secondary | ICD-10-CM | POA: Diagnosis not present

## 2023-12-21 DIAGNOSIS — Z8673 Personal history of transient ischemic attack (TIA), and cerebral infarction without residual deficits: Secondary | ICD-10-CM | POA: Diagnosis not present

## 2023-12-21 DIAGNOSIS — Z881 Allergy status to other antibiotic agents status: Secondary | ICD-10-CM | POA: Diagnosis not present

## 2023-12-21 DIAGNOSIS — K5901 Slow transit constipation: Secondary | ICD-10-CM | POA: Diagnosis not present

## 2023-12-21 HISTORY — PX: EXTERNAL FIXATION LEG: SHX1549

## 2023-12-21 HISTORY — PX: INCISION AND DRAINAGE OF WOUND: SHX1803

## 2023-12-21 LAB — CBC
HCT: 37.1 % (ref 36.0–46.0)
HCT: 39.6 % (ref 36.0–46.0)
Hemoglobin: 12.5 g/dL (ref 12.0–15.0)
Hemoglobin: 13.4 g/dL (ref 12.0–15.0)
MCH: 33.8 pg (ref 26.0–34.0)
MCH: 33.9 pg (ref 26.0–34.0)
MCHC: 33.7 g/dL (ref 30.0–36.0)
MCHC: 33.8 g/dL (ref 30.0–36.0)
MCV: 100.3 fL — ABNORMAL HIGH (ref 80.0–100.0)
MCV: 100.3 fL — ABNORMAL HIGH (ref 80.0–100.0)
Platelets: 241 10*3/uL (ref 150–400)
Platelets: 243 10*3/uL (ref 150–400)
RBC: 3.7 MIL/uL — ABNORMAL LOW (ref 3.87–5.11)
RBC: 3.95 MIL/uL (ref 3.87–5.11)
RDW: 15.4 % (ref 11.5–15.5)
RDW: 15.5 % (ref 11.5–15.5)
WBC: 10.6 10*3/uL — ABNORMAL HIGH (ref 4.0–10.5)
WBC: 12 10*3/uL — ABNORMAL HIGH (ref 4.0–10.5)
nRBC: 0 % (ref 0.0–0.2)
nRBC: 0 % (ref 0.0–0.2)

## 2023-12-21 LAB — BASIC METABOLIC PANEL
Anion gap: 9 (ref 5–15)
BUN: 5 mg/dL — ABNORMAL LOW (ref 6–20)
CO2: 26 mmol/L (ref 22–32)
Calcium: 8.5 mg/dL — ABNORMAL LOW (ref 8.9–10.3)
Chloride: 108 mmol/L (ref 98–111)
Creatinine, Ser: 0.64 mg/dL (ref 0.44–1.00)
GFR, Estimated: >60 mL/min (ref >60)
Glucose, Bld: 104 mg/dL — ABNORMAL HIGH (ref 70–99)
Potassium: 3.7 mmol/L (ref 3.5–5.1)
Sodium: 143 mmol/L (ref 135–145)

## 2023-12-21 LAB — CREATININE, SERUM
Creatinine, Ser: 0.77 mg/dL (ref 0.44–1.00)
GFR, Estimated: >60 mL/min (ref >60)

## 2023-12-21 LAB — SURGICAL PCR SCREEN
MRSA, PCR: NEGATIVE
Staphylococcus aureus: NEGATIVE

## 2023-12-21 LAB — HIV ANTIBODY (ROUTINE TESTING W REFLEX): HIV Screen 4th Generation wRfx: NONREACTIVE

## 2023-12-21 LAB — MAGNESIUM: Magnesium: 1.6 mg/dL — ABNORMAL LOW (ref 1.7–2.4)

## 2023-12-21 LAB — PHOSPHORUS: Phosphorus: 3.3 mg/dL (ref 2.5–4.6)

## 2023-12-21 SURGERY — IRRIGATION AND DEBRIDEMENT WOUND
Anesthesia: General | Site: Ankle | Laterality: Right

## 2023-12-21 MED ORDER — SUGAMMADEX SODIUM 200 MG/2ML IV SOLN
INTRAVENOUS | Status: DC | PRN
  Administered 2023-12-21: 200 mg via INTRAVENOUS

## 2023-12-21 MED ORDER — MIDAZOLAM HCL 2 MG/2ML IJ SOLN
INTRAMUSCULAR | Status: AC
  Filled 2023-12-21: qty 2

## 2023-12-21 MED ORDER — POVIDONE-IODINE 10 % EX SWAB
2.0000 | Freq: Once | CUTANEOUS | Status: AC
  Administered 2023-12-21: 2 via TOPICAL

## 2023-12-21 MED ORDER — PROPOFOL 10 MG/ML IV BOLUS
INTRAVENOUS | Status: AC
  Filled 2023-12-21: qty 20

## 2023-12-21 MED ORDER — LIDOCAINE 2% (20 MG/ML) 5 ML SYRINGE
INTRAMUSCULAR | Status: AC
  Filled 2023-12-21: qty 5

## 2023-12-21 MED ORDER — FENTANYL CITRATE (PF) 100 MCG/2ML IJ SOLN
25.0000 ug | INTRAMUSCULAR | Status: DC | PRN
  Administered 2023-12-21 (×3): 50 ug via INTRAVENOUS

## 2023-12-21 MED ORDER — OXYCODONE HCL 5 MG/5ML PO SOLN: 5.0000 mg | Freq: Once | ORAL | Status: DC | PRN

## 2023-12-21 MED ORDER — LIDOCAINE 2% (20 MG/ML) 5 ML SYRINGE
INTRAMUSCULAR | Status: DC | PRN
  Administered 2023-12-21: 100 mg via INTRAVENOUS

## 2023-12-21 MED ORDER — FENTANYL CITRATE (PF) 100 MCG/2ML IJ SOLN
INTRAMUSCULAR | Status: AC
  Filled 2023-12-21: qty 2

## 2023-12-21 MED ORDER — OXYCODONE HCL 5 MG PO TABS: 5.0000 mg | ORAL_TABLET | Freq: Once | ORAL | Status: DC | PRN

## 2023-12-21 MED ORDER — ROCURONIUM BROMIDE 10 MG/ML (PF) SYRINGE
PREFILLED_SYRINGE | INTRAVENOUS | Status: DC | PRN
  Administered 2023-12-21: 60 mg via INTRAVENOUS

## 2023-12-21 MED ORDER — TORSEMIDE 20 MG PO TABS
20.0000 mg | ORAL_TABLET | Freq: Two times a day (BID) | ORAL | Status: DC
  Administered 2023-12-21 – 2023-12-24 (×7): 20 mg via ORAL
  Filled 2023-12-21 (×7): qty 1

## 2023-12-21 MED ORDER — METHOCARBAMOL 500 MG PO TABS
500.0000 mg | ORAL_TABLET | Freq: Four times a day (QID) | ORAL | Status: DC | PRN
  Administered 2023-12-22: 500 mg via ORAL
  Filled 2023-12-21: qty 1

## 2023-12-21 MED ORDER — POTASSIUM CHLORIDE ER 10 MEQ PO TBCR
10.0000 meq | EXTENDED_RELEASE_TABLET | Freq: Every day | ORAL | Status: DC
  Administered 2023-12-21 – 2023-12-24 (×4): 10 meq via ORAL
  Filled 2023-12-21 (×8): qty 1

## 2023-12-21 MED ORDER — ACETAMINOPHEN 500 MG PO TABS
ORAL_TABLET | ORAL | Status: AC
Start: 2023-12-21 — End: 2023-12-21
  Administered 2023-12-21: 1000 mg via ORAL
  Filled 2023-12-21: qty 1

## 2023-12-21 MED ORDER — ONDANSETRON HCL 4 MG/2ML IJ SOLN: 4.0000 mg | Freq: Once | INTRAMUSCULAR | Status: DC | PRN

## 2023-12-21 MED ORDER — VANCOMYCIN HCL 1000 MG IV SOLR
INTRAVENOUS | Status: AC
  Filled 2023-12-21: qty 20

## 2023-12-21 MED ORDER — PHENYLEPHRINE 80 MCG/ML (10ML) SYRINGE FOR IV PUSH (FOR BLOOD PRESSURE SUPPORT)
PREFILLED_SYRINGE | INTRAVENOUS | Status: DC | PRN
  Administered 2023-12-21 (×3): 80 ug via INTRAVENOUS

## 2023-12-21 MED ORDER — ONDANSETRON HCL 4 MG/2ML IJ SOLN
INTRAMUSCULAR | Status: DC | PRN
  Administered 2023-12-21: 4 mg via INTRAVENOUS

## 2023-12-21 MED ORDER — HYDROMORPHONE HCL 1 MG/ML IJ SOLN
INTRAMUSCULAR | Status: AC
  Filled 2023-12-21: qty 1

## 2023-12-21 MED ORDER — ONDANSETRON HCL 4 MG/2ML IJ SOLN
INTRAMUSCULAR | Status: AC
  Filled 2023-12-21: qty 2

## 2023-12-21 MED ORDER — DIPHENHYDRAMINE HCL 50 MG/ML IJ SOLN
25.0000 mg | Freq: Once | INTRAMUSCULAR | Status: AC
  Administered 2023-12-21: 25 mg via INTRAVENOUS
  Filled 2023-12-21: qty 1

## 2023-12-21 MED ORDER — ACETAMINOPHEN 500 MG PO TABS
1000.0000 mg | ORAL_TABLET | Freq: Once | ORAL | Status: AC
Start: 2023-12-21 — End: 2023-12-21

## 2023-12-21 MED ORDER — ACETAMINOPHEN 325 MG PO TABS
650.0000 mg | ORAL_TABLET | Freq: Four times a day (QID) | ORAL | Status: DC
  Administered 2023-12-21 – 2023-12-24 (×11): 650 mg via ORAL
  Filled 2023-12-21 (×11): qty 2

## 2023-12-21 MED ORDER — DEXAMETHASONE SODIUM PHOSPHATE 10 MG/ML IJ SOLN
INTRAMUSCULAR | Status: DC | PRN
  Administered 2023-12-21: 10 mg via INTRAVENOUS

## 2023-12-21 MED ORDER — VANCOMYCIN HCL IN DEXTROSE 1-5 GM/200ML-% IV SOLN: 1000.0000 mg | Freq: Once | INTRAVENOUS | Status: AC

## 2023-12-21 MED ORDER — TRANEXAMIC ACID-NACL 1000-0.7 MG/100ML-% IV SOLN
INTRAVENOUS | Status: AC
  Filled 2023-12-21: qty 100

## 2023-12-21 MED ORDER — CHLORHEXIDINE GLUCONATE 0.12 % MT SOLN
OROMUCOSAL | Status: AC
  Administered 2023-12-21: 15 mL via OROMUCOSAL
  Filled 2023-12-21: qty 15

## 2023-12-21 MED ORDER — METHOCARBAMOL 1000 MG/10ML IJ SOLN: 500.0000 mg | Freq: Four times a day (QID) | INTRAMUSCULAR | Status: DC | PRN

## 2023-12-21 MED ORDER — FENTANYL CITRATE (PF) 250 MCG/5ML IJ SOLN
INTRAMUSCULAR | Status: DC | PRN
  Administered 2023-12-21 (×2): 50 ug via INTRAVENOUS
  Administered 2023-12-21: 100 ug via INTRAVENOUS

## 2023-12-21 MED ORDER — LACTATED RINGERS IV SOLN: INTRAVENOUS | Status: DC

## 2023-12-21 MED ORDER — DOCUSATE SODIUM 100 MG PO CAPS
100.0000 mg | ORAL_CAPSULE | Freq: Two times a day (BID) | ORAL | Status: DC
  Administered 2023-12-21 – 2023-12-24 (×6): 100 mg via ORAL
  Filled 2023-12-21 (×6): qty 1

## 2023-12-21 MED ORDER — MIDAZOLAM HCL 2 MG/2ML IJ SOLN
INTRAMUSCULAR | Status: DC | PRN
  Administered 2023-12-21: 2 mg via INTRAVENOUS

## 2023-12-21 MED ORDER — POLYETHYLENE GLYCOL 3350 17 G PO PACK
17.0000 g | PACK | Freq: Two times a day (BID) | ORAL | Status: AC
  Administered 2023-12-22 (×2): 17 g via ORAL
  Filled 2023-12-21 (×4): qty 1

## 2023-12-21 MED ORDER — HYDROMORPHONE HCL 1 MG/ML IJ SOLN
1.0000 mg | INTRAMUSCULAR | Status: DC | PRN
  Administered 2023-12-21: 1 mg via INTRAVENOUS
  Filled 2023-12-21: qty 1

## 2023-12-21 MED ORDER — VANCOMYCIN HCL 1000 MG IV SOLR
INTRAVENOUS | Status: DC | PRN
  Administered 2023-12-21: 1000 mg

## 2023-12-21 MED ORDER — ORAL CARE MOUTH RINSE: 15.0000 mL | Freq: Once | OROMUCOSAL | Status: AC

## 2023-12-21 MED ORDER — ENOXAPARIN SODIUM 40 MG/0.4ML IJ SOSY
40.0000 mg | PREFILLED_SYRINGE | INTRAMUSCULAR | Status: DC
  Administered 2023-12-22 – 2023-12-24 (×3): 40 mg via SUBCUTANEOUS
  Filled 2023-12-21 (×3): qty 0.4

## 2023-12-21 MED ORDER — CHLORHEXIDINE GLUCONATE 0.12 % MT SOLN: 15.0000 mL | Freq: Once | OROMUCOSAL | Status: AC

## 2023-12-21 MED ORDER — TRANEXAMIC ACID-NACL 1000-0.7 MG/100ML-% IV SOLN
1000.0000 mg | INTRAVENOUS | Status: AC
  Administered 2023-12-21: 1000 mg via INTRAVENOUS

## 2023-12-21 MED ORDER — HYDROXYZINE HCL 10 MG PO TABS
10.0000 mg | ORAL_TABLET | Freq: Three times a day (TID) | ORAL | Status: DC | PRN
Start: 2023-12-21 — End: 2023-12-24
  Administered 2023-12-21: 10 mg via ORAL
  Filled 2023-12-21: qty 1

## 2023-12-21 MED ORDER — ALBUMIN HUMAN 5 % IV SOLN: INTRAVENOUS | Status: DC | PRN

## 2023-12-21 MED ORDER — ROCURONIUM BROMIDE 10 MG/ML (PF) SYRINGE
PREFILLED_SYRINGE | INTRAVENOUS | Status: AC
  Filled 2023-12-21: qty 10

## 2023-12-21 MED ORDER — CHLORHEXIDINE GLUCONATE 4 % EX SOLN: 60.0000 mL | Freq: Once | CUTANEOUS | Status: DC

## 2023-12-21 MED ORDER — DEXAMETHASONE SODIUM PHOSPHATE 10 MG/ML IJ SOLN
INTRAMUSCULAR | Status: AC
  Filled 2023-12-21: qty 1

## 2023-12-21 MED ORDER — PROPOFOL 10 MG/ML IV BOLUS
INTRAVENOUS | Status: DC | PRN
  Administered 2023-12-21: 90 mg via INTRAVENOUS

## 2023-12-21 MED ORDER — HYDROMORPHONE HCL 1 MG/ML IJ SOLN: 1.0000 mg | INTRAMUSCULAR | Status: AC

## 2023-12-21 MED ORDER — VANCOMYCIN HCL IN DEXTROSE 1-5 GM/200ML-% IV SOLN
INTRAVENOUS | Status: AC
Start: 2023-12-21 — End: 2023-12-21
  Administered 2023-12-21: 1000 mg via INTRAVENOUS
  Filled 2023-12-21: qty 200

## 2023-12-21 MED ORDER — CLINDAMYCIN PHOSPHATE 900 MG/50ML IV SOLN
900.0000 mg | Freq: Three times a day (TID) | INTRAVENOUS | Status: AC
  Administered 2023-12-21 – 2023-12-22 (×3): 900 mg via INTRAVENOUS
  Filled 2023-12-21 (×3): qty 50

## 2023-12-21 MED ORDER — FENTANYL CITRATE (PF) 250 MCG/5ML IJ SOLN
INTRAMUSCULAR | Status: AC
  Filled 2023-12-21: qty 5

## 2023-12-21 MED ORDER — ACETAMINOPHEN 10 MG/ML IV SOLN: 1000.0000 mg | Freq: Once | INTRAVENOUS | Status: DC | PRN

## 2023-12-21 SURGICAL SUPPLY — 54 items
BAG COUNTER SPONGE SURGICOUNT (BAG) ×1 IMPLANT
BIT DRILL CALIBRATED 4.3X320MM (BIT) IMPLANT
BIT DRILL CROWE POINT TWST 4.3 (DRILL) IMPLANT
BNDG ELASTIC 4INX 5YD STR LF (GAUZE/BANDAGES/DRESSINGS) IMPLANT
BNDG ELASTIC 4X5.8 VLCR STR LF (GAUZE/BANDAGES/DRESSINGS) ×1 IMPLANT
BNDG ELASTIC 6INX 5YD STR LF (GAUZE/BANDAGES/DRESSINGS) ×1 IMPLANT
CHLORAPREP W/TINT 26 (MISCELLANEOUS) ×1 IMPLANT
COVER SURGICAL LIGHT HANDLE (MISCELLANEOUS) ×1 IMPLANT
DRAPE C-ARM 42X72 X-RAY (DRAPES) ×1 IMPLANT
DRAPE C-ARMOR (DRAPES) IMPLANT
DRAPE HALF SHEET 40X57 (DRAPES) ×2 IMPLANT
DRAPE INCISE IOBAN 66X45 STRL (DRAPES) IMPLANT
DRAPE U-SHAPE 47X51 STRL (DRAPES) ×1 IMPLANT
DRILL CALIBRATED 4.3X320MM (BIT) ×1 IMPLANT
DRILL CROWE POINT TWIST 4.3 (DRILL) ×1 IMPLANT
DRSG ADAPTIC 3X8 NADH LF (GAUZE/BANDAGES/DRESSINGS) ×1 IMPLANT
DRSG MEPITEL 4X7.2 (GAUZE/BANDAGES/DRESSINGS) IMPLANT
ELECT REM PT RETURN 9FT ADLT (ELECTROSURGICAL) ×1 IMPLANT
ELECTRODE REM PT RTRN 9FT ADLT (ELECTROSURGICAL) ×1 IMPLANT
GAUZE PAD ABD 8X10 STRL (GAUZE/BANDAGES/DRESSINGS) ×1 IMPLANT
GAUZE SPONGE 4X4 12PLY STRL (GAUZE/BANDAGES/DRESSINGS) IMPLANT
GLOVE BIO SURGEON STRL SZ8.5 (GLOVE) ×3 IMPLANT
GLOVE BIOGEL M 7.0 STRL (GLOVE) ×1 IMPLANT
GLOVE BIOGEL PI IND STRL 7.5 (GLOVE) ×1 IMPLANT
GLOVE BIOGEL PI IND STRL 8.5 (GLOVE) ×1 IMPLANT
GOWN STRL REUS W/ TWL XL LVL3 (GOWN DISPOSABLE) ×2 IMPLANT
GUIDEPIN VERSANAIL DSP 3.2X444 (ORTHOPEDIC DISPOSABLE SUPPLIES) IMPLANT
GUIDEWIRE 2.6X80 BEAD TIP (WIRE) IMPLANT
GUIDWIRE 2.6X80 BEAD TIP (WIRE) ×1 IMPLANT
KIT BASIN OR (CUSTOM PROCEDURE TRAY) ×1 IMPLANT
KIT TURNOVER KIT B (KITS) ×1 IMPLANT
MANIFOLD NEPTUNE II (INSTRUMENTS) ×1 IMPLANT
NAIL TIBIAL PHOENIX 9.0X320MM (Nail) IMPLANT
NS IRRIG 1000ML POUR BTL (IV SOLUTION) ×1 IMPLANT
PACK ORTHO EXTREMITY (CUSTOM PROCEDURE TRAY) ×1 IMPLANT
PAD ARMBOARD POSITIONER FOAM (MISCELLANEOUS) ×2 IMPLANT
PAD CAST 4YDX4 CTTN HI CHSV (CAST SUPPLIES) ×1 IMPLANT
PADDING CAST COTTON 6X4 STRL (CAST SUPPLIES) IMPLANT
PENCIL BUTTON HOLSTER BLD 10FT (ELECTRODE) ×1 IMPLANT
SCREW CORT TI DBL LEAD 5X30 (Screw) IMPLANT
SCREW CORT TI DBL LEAD 5X32 (Screw) IMPLANT
SCREW CORT TI DBL LEAD 5X38 (Screw) IMPLANT
SCREW CORT TI DBL LEAD 5X46 (Screw) IMPLANT
STOCKINETTE 6 STRL (DRAPES) ×1 IMPLANT
SUT CHROMIC 2 0 UR 5 27 (SUTURE) IMPLANT
SUT ETHILON 3 0 FSLX (SUTURE) ×1 IMPLANT
SUT MON AB 2-0 CT1 36 (SUTURE) IMPLANT
SUT VIC AB 0 CT1 27XBRD ANBCTR (SUTURE) IMPLANT
SUT VIC AB 2-0 CTB1 (SUTURE) ×1 IMPLANT
TOWEL GREEN STERILE (TOWEL DISPOSABLE) ×1 IMPLANT
TOWEL GREEN STERILE FF (TOWEL DISPOSABLE) ×1 IMPLANT
TUBE CONNECTING 12X1/4 (SUCTIONS) ×1 IMPLANT
WATER STERILE IRR 1000ML POUR (IV SOLUTION) ×1 IMPLANT
YANKAUER SUCT BULB TIP NO VENT (SUCTIONS) IMPLANT

## 2023-12-21 NOTE — H&P (View-Only) (Signed)
 Reason for Consult:Open right ankle fx Referring Physician: Marinda Elk Time called: 0730 Time at bedside: 1601   Annette Lucas is an 53 y.o. female.  HPI: [name] slipped going into her bathroom last night and hit her right foot on the doorframe. She had immediate ankle pain and a large wound and could not bear weight. She was brought to the ED where x-rays showed an open ankle fx and orthopedic surgery was consulted. She lives at home alone and works from home. She does not use any assistive devices to ambulate.  Past Medical History:  Diagnosis Date   Allergy to meat    GERD (gastroesophageal reflux disease)    IBS (irritable bowel syndrome)     Past Surgical History:  Procedure Laterality Date   CYSTOSCOPY  07/06/2011   Procedure: CYSTOSCOPY;  Surgeon: Zenaida Niece, MD;  Location: [facility];  Service: Gynecology;  Laterality: N/A;   ENDOMETRIAL ABLATION     LAPAROSCOPIC SALPINGO OOPHERECTOMY Bilateral 09/01/2016   Procedure: LAPAROSCOPIC SALPINGO OOPHORECTOMY;  Surgeon: Lavina Hamman, MD;  Location: [facility];  Service: Gynecology;  Laterality: Bilateral;   VAGINAL HYSTERECTOMY  07/06/2011   Procedure: HYSTERECTOMY VAGINAL;  Surgeon: Zenaida Niece, MD;  Location: [facility];  Service: Gynecology;  Laterality: N/A;    History reviewed. No pertinent family history.  Social History:  reports that she has never smoked. She has never used smokeless tobacco. She reports current alcohol use. She reports that she does not use drugs.  Allergies:  Allergies  Allergen Reactions   Levofloxacin Anaphylaxis   Other Anaphylaxis    Meat allergy: Beef, pork, lamb   Penicillins Anaphylaxis    Has patient had a PCN reaction causing immediate rash, facial/tongue/throat swelling, SOB or lightheadedness with hypotension: Yes Has patient had a PCN reaction causing severe rash involving mucus membranes or skin necrosis: No Has patient had a PCN reaction that required hospitalization Yes Has  patient had a PCN reaction occurring within the last 10 years: No If all of the above answers are "NO", then may proceed with Cephalosporin use.     Medications: I have reviewed the patient's current medications.  Results for orders placed or performed during the hospital encounter of 12/20/23 (from the past 48 hours)  Basic metabolic panel     Status: Abnormal   Collection Time: 12/20/23  7:33 PM  Result Value Ref Range   Sodium 145 135 - 145 mmol/L   Potassium 3.1 (L) 3.5 - 5.1 mmol/L   Chloride 113 (H) 98 - 111 mmol/L   CO2 22 22 - 32 mmol/L   Glucose, Bld 87 70 - 99 mg/dL    Comment: Glucose reference range applies only to samples taken after fasting for at least 8 hours.   BUN <5 (L) 6 - 20 mg/dL   Creatinine, Ser 0.93 0.44 - 1.00 mg/dL   Calcium 8.2 (L) 8.9 - 10.3 mg/dL   GFR, Estimated >23 >55 mL/min    Comment: (NOTE) Calculated using the CKD-EPI Creatinine Equation (2021)    Anion gap 10 5 - 15    Comment: Performed at [facility], [address] [place], [place] [address]  CBC with Differential     Status: Abnormal   Collection Time: 12/20/23  7:33 PM  Result Value Ref Range   WBC 9.7 4.0 - 10.5 K/uL   RBC 3.77 (L) 3.87 - 5.11 MIL/uL   Hemoglobin 12.7 12.0 - 15.0 g/dL   HCT 25.4 27.0 - 62.3 %  MCV 97.9 80.0 - 100.0 fL   MCH 33.7 26.0 - 34.0 pg   MCHC 34.4 30.0 - 36.0 g/dL   RDW 40.9 81.1 - 91.4 %   Platelets 227 150 - 400 K/uL   nRBC 0.0 0.0 - 0.2 %   Neutrophils Relative % 79 %   Neutro Abs 7.6 1.7 - 7.7 K/uL   Lymphocytes Relative 15 %   Lymphs Abs 1.5 0.7 - 4.0 K/uL   Monocytes Relative 5 %   Monocytes Absolute 0.5 0.1 - 1.0 K/uL   Eosinophils Relative 0 %   Eosinophils Absolute 0.0 0.0 - 0.5 K/uL   Basophils Relative 1 %   Basophils Absolute 0.1 0.0 - 0.1 K/uL   Immature Granulocytes 0 %   Abs Immature Granulocytes 0.04 0.00 - 0.07 K/uL    Comment: Performed at [facility], [address], [place], [place] [address]  Protime-INR      Status: None   Collection Time: 12/20/23  7:33 PM  Result Value Ref Range   Prothrombin Time 13.1 11.4 - 15.2 seconds   INR 1.0 0.8 - 1.2    Comment: (NOTE) INR goal varies based on device and disease states. Performed at [facility], [address] [place], [place] [address]   Surgical pcr screen     Status: None   Collection Time: 12/21/23  5:28 AM   Specimen: Nasal Mucosa; Nasal Swab  Result Value Ref Range   MRSA, PCR NEGATIVE NEGATIVE   Staphylococcus aureus NEGATIVE NEGATIVE    Comment: (NOTE) The Xpert SA Assay (FDA approved for NASAL specimens in patients 71 years of age and older), is one component of a comprehensive surveillance program. It is not intended to diagnose infection nor to guide or monitor treatment. Performed at [facility], [address], [place], [place] [address]   CBC     Status: Abnormal   Collection Time: 12/21/23  6:20 AM  Result Value Ref Range   WBC 10.6 (H) 4.0 - 10.5 K/uL   RBC 3.70 (L) 3.87 - 5.11 MIL/uL   Hemoglobin 12.5 12.0 - 15.0 g/dL   HCT 46.9 62.9 - 52.8 %   MCV 100.3 (H) 80.0 - 100.0 fL   MCH 33.8 26.0 - 34.0 pg   MCHC 33.7 30.0 - 36.0 g/dL   RDW 41.3 24.4 - 01.0 %   Platelets 241 150 - 400 K/uL   nRBC 0.0 0.0 - 0.2 %    Comment: Performed at [facility], [address] [place], [place] [address]  Basic metabolic panel     Status: Abnormal   Collection Time: 12/21/23  6:20 AM  Result Value Ref Range   Sodium 143 135 - 145 mmol/L   Potassium 3.7 3.5 - 5.1 mmol/L   Chloride 108 98 - 111 mmol/L   CO2 26 22 - 32 mmol/L   Glucose, Bld 104 (H) 70 - 99 mg/dL    Comment: Glucose reference range applies only to samples taken after fasting for at least 8 hours.   BUN 5 (L) 6 - 20 mg/dL   Creatinine, Ser 6.64 0.44 - 1.00 mg/dL   Calcium 8.5 (L) 8.9 - 10.3 mg/dL   GFR, Estimated >40 >34 mL/min    Comment: (NOTE) Calculated using the CKD-EPI Creatinine Equation (2021)    Anion gap 9 5 - 15    Comment:  Performed at [facility], [address] [place], [place] [address]  Magnesium  Status: Abnormal   Collection Time: 12/21/23  6:20 AM  Result Value Ref Range   Magnesium 1.6 (L) 1.7 - 2.4 mg/dL    Comment: Performed at [facility], [address], [place], [place] [address]  Phosphorus     Status: None   Collection Time: 12/21/23  6:20 AM  Result Value Ref Range   Phosphorus 3.3 2.5 - 4.6 mg/dL    Comment: Performed at [facility], [address] [place], [place] [address]  HIV Antibody (routine testing w rflx)     Status: None   Collection Time: 12/21/23  6:20 AM  Result Value Ref Range   HIV Screen 4th Generation wRfx Non Reactive Non Reactive    Comment: Performed at [facility], [address], [place], [place] [address]    DG Ankle Complete Right Result Date: 12/20/2023 CLINICAL DATA:  Status post reduction of right ankle fractures. EXAM: RIGHT ANKLE - COMPLETE 3+ VIEW COMPARISON:  Same day. FINDINGS: The right ankle has been casted and immobilized. There is continued presence of moderately angulated and comminuted fractures involving the distal right tibia and fibula. IMPRESSION: Moderately angulated and comminuted distal right tibial and fibular fractures. Electronically Signed   By: Lupita Raider M.D.   On: 12/20/2023 20:27   DG Ankle Right Port Result Date: 12/20/2023 CLINICAL DATA:  Right ankle laceration after injury. EXAM: PORTABLE RIGHT ANKLE - 2 VIEW COMPARISON:  None Available. FINDINGS: Only a single lateral image was obtained. This demonstrates severely displaced fractures involving the distal right tibia and fibula. IMPRESSION: Severely displaced distal right tibial and fibular fractures. Electronically Signed   By: Lupita Raider M.D.   On: 12/20/2023 19:10    Review of Systems  HENT:  Negative for ear discharge, ear pain, hearing loss and tinnitus.   Eyes:  Negative for photophobia and pain.  Respiratory:  Negative for cough and  shortness of breath.   Cardiovascular:  Negative for chest pain.  Gastrointestinal:  Negative for abdominal pain, nausea and vomiting.  Genitourinary:  Negative for dysuria, flank pain, frequency and urgency.  Musculoskeletal:  Positive for arthralgias (Right ankle). Negative for back pain, myalgias and neck pain.  Neurological:  Negative for dizziness and headaches.  Hematological:  Does not bruise/bleed easily.  Psychiatric/Behavioral:  The patient is not nervous/anxious.    Blood pressure 131/77, pulse 92, temperature 99.1 F (37.3 C), temperature source Oral, resp. rate 18, height 5\' 4"  (1.626 m), weight 61.2 kg, last menstrual period 06/14/2011, SpO2 100%. Physical Exam Constitutional:      General: She is not in acute distress.    Appearance: She is well-developed. She is not diaphoretic.  HENT:     Head: Normocephalic and atraumatic.  Eyes:     General: No scleral icterus.       Right eye: No discharge.        Left eye: No discharge.     Conjunctiva/sclera: Conjunctivae normal.  Cardiovascular:     Rate and Rhythm: Normal rate and regular rhythm.  Pulmonary:     Effort: Pulmonary effort is normal. No respiratory distress.  Musculoskeletal:     Cervical back: Normal range of motion.     Comments: RLE No traumatic wounds, ecchymosis, or rash  Short leg splint in place  No knee effusion  Knee stable to varus/ valgus and anterior/posterior stress  Sens DPN, SPN, TN paresthetic (baseline)  Motor EHL 5/5  Toes perfused, No significant edema  Skin:  General: Skin is warm and dry.  Neurological:     Mental Status: She is alert.  Psychiatric:        Mood and Affect: Mood normal.        Behavior: Behavior normal.     Assessment/Plan: Open right ankle fx -- Plan I&D, ORIF today with Dr. Linna Caprice. Please keep NPO.    Freeman Caldron, PA-C Orthopedic Surgery [phone] 12/21/2023, 9:44 AM

## 2023-12-21 NOTE — Interval H&P Note (Signed)
 History and Physical Interval Note:  12/21/2023 3:28 PM  Annette Lucas  has presented today for surgery, with the diagnosis of open right tibia fibula fracture.  The various methods of treatment have been discussed with the patient and family. After consideration of risks, benefits and other options for treatment, the patient has consented to  Procedure(s) with comments: IRRIGATION AND DEBRIDEMENT WOUND (Right) EXTERNAL FIXATION, LOWER EXTREMITY (Right) - zimmer large c arm wound vac with prevena dressing handy bed as a surgical intervention.  The patient's history has been reviewed, patient examined, no change in status, stable for surgery.  I have reviewed the patient's chart and labs.  Questions were answered to the patient's satisfaction.     Caryn Bee P Brittanyann Wittner

## 2023-12-21 NOTE — Anesthesia Procedure Notes (Addendum)
 Procedure Name: Intubation Date/Time: 12/21/2023 3:45 PM  Performed by: Einar Grad, CRNAPre-anesthesia Checklist: Patient identified, Emergency Drugs available, Suction available and Patient being monitored Patient Re-evaluated:Patient Re-evaluated prior to induction Oxygen Delivery Method: Circle System Utilized Preoxygenation: Pre-oxygenation with 100% oxygen Induction Type: IV induction Ventilation: Mask ventilation without difficulty Laryngoscope Size: Mac and 4 Grade View: Grade I Tube type: Oral Tube size: 7.0 mm Number of attempts: 1 Airway Equipment and Method: Stylet Placement Confirmation: ETT inserted through vocal cords under direct vision, positive ETCO2 and breath sounds checked- equal and bilateral Secured at: 22 cm Tube secured with: Tape Dental Injury: Teeth and Oropharynx as per pre-operative assessment

## 2023-12-21 NOTE — Progress Notes (Signed)
 [facility] PROGRESS NOTE    Progress Note  Annette Lucas  UUV:[id number] DOB: 17-Jul-1971 DOA: 12/20/2023 PCP: Lianne Moris, PA-C     Brief Narrative:   Annette Lucas is an 53 y.o. female past medical history of peripheral neuropathy on gabapentin comes into the ER after a fall at home in the bathroom and twisted her ankle called EMS due to significant pain and bleeding   Assessment/Plan:   Right ankle fracture due to mechanical fall: Surgery was consulted was placed n.p.o. for surgical intervention on 12/21/2023. Continue IV fluids. Analgesics  per orthopedic surgery. Orthopedic recommended to hold chemical DVT surgery. Awaiting Ortho further recommendations.  Peripheral neuropathy Continue gabapentin.  Chronic anxiety Continue BuSpar.  DVT prophylaxis: lovenox Family Communication:none Status is: Observation The patient remains OBS appropriate and will d/c before 2 midnights.    Code Status:     Code Status Orders  (From admission, onward)           Start     Ordered   12/20/23 2045  Full code  Continuous       Question:  By:  Answer:  Consent: discussion documented in EHR   12/20/23 2044           Code Status History     Date Active Date Inactive Code Status Order ID Comments User Context   01/15/2020 2158 01/19/2020 1716 Full Code [id number]  Onnie Boer, MD Inpatient         IV Access:   Peripheral IV   Procedures and diagnostic studies:   DG Ankle Complete Right Result Date: 12/20/2023 CLINICAL DATA:  Status post reduction of right ankle fractures. EXAM: RIGHT ANKLE - COMPLETE 3+ VIEW COMPARISON:  Same day. FINDINGS: The right ankle has been casted and immobilized. There is continued presence of moderately angulated and comminuted fractures involving the distal right tibia and fibula. IMPRESSION: Moderately angulated and comminuted distal right tibial and fibular fractures. Electronically Signed   By: Lupita Raider M.D.    On: 12/20/2023 20:27   DG Ankle Right Port Result Date: 12/20/2023 CLINICAL DATA:  Right ankle laceration after injury. EXAM: PORTABLE RIGHT ANKLE - 2 VIEW COMPARISON:  None Available. FINDINGS: Only a single lateral image was obtained. This demonstrates severely displaced fractures involving the distal right tibia and fibula. IMPRESSION: Severely displaced distal right tibial and fibular fractures. Electronically Signed   By: Lupita Raider M.D.   On: 12/20/2023 19:10     Medical Consultants:   None.   Subjective:    Annette Lucas complaining of pain, relates the pain medication does not last long amount  Objective:    Vitals:   12/20/23 2013 12/20/23 2018 12/20/23 2142 12/21/23 0443  BP: (!) 156/71 (!) 133/97 137/75 119/71  Pulse: (!) 102 92 98 99  Resp: (!) 29 16 16    Temp:  99.2 F (37.3 C) 99.4 F (37.4 C) 99 F (37.2 C)  TempSrc:  Oral    SpO2: 100% 100% 95% 96%  Weight:      Height:       SpO2: 96 %   Intake/Output Summary (Last 24 hours) at 12/21/2023 0752 Last data filed at 12/20/2023 2114 Gross per 24 hour  Intake 44.76 ml  Output --  Net 44.76 ml   Filed Weights   12/20/23 1832  Weight: 61.2 kg    Exam: General exam: In no acute distress. Respiratory system: Good air movement and clear to auscultation. Cardiovascular system: S1 & S2 heard,  RRR. No JVD. Gastrointestinal system: Abdomen is nondistended, soft and nontender.  Extremities: No pedal edema. Skin: No rashes, lesions or ulcers Psychiatry: Judgement and insight appear normal. Mood & affect appropriate.    Data Reviewed:    Labs: Basic Metabolic Panel: Recent Labs  Lab 12/20/23 1933 12/21/23 0620  NA 145 143  K 3.1* 3.7  CL 113* 108  CO2 22 26  GLUCOSE 87 104*  BUN <5* 5*  CREATININE 0.44 0.64  CALCIUM 8.2* 8.5*  MG  --  1.6*  PHOS  --  3.3   GFR Estimated Creatinine Clearance: 71.8 mL/min (by C-G formula based on SCr of 0.64 mg/dL). Liver Function Tests: No results for  input(s): "AST", "ALT", "ALKPHOS", "BILITOT", "PROT", "ALBUMIN" in the last 168 hours. No results for input(s): "LIPASE", "AMYLASE" in the last 168 hours. No results for input(s): "AMMONIA" in the last 168 hours. Coagulation profile Recent Labs  Lab 12/20/23 1933  INR 1.0   COVID-19 Labs  No results for input(s): "DDIMER", "FERRITIN", "LDH", "CRP" in the last 72 hours.  Lab Results  Component Value Date   SARSCOV2NAA NEGATIVE 09/13/2022   SARSCOV2NAA NEGATIVE 01/15/2020    CBC: Recent Labs  Lab 12/20/23 1933 12/21/23 0620  WBC 9.7 10.6*  NEUTROABS 7.6  --   HGB 12.7 12.5  HCT 36.9 37.1  MCV 97.9 100.3*  PLT 227 241   Cardiac Enzymes: No results for input(s): "CKTOTAL", "CKMB", "CKMBINDEX", "TROPONINI" in the last 168 hours. BNP (last 3 results) No results for input(s): "PROBNP" in the last 8760 hours. CBG: No results for input(s): "GLUCAP" in the last 168 hours. D-Dimer: No results for input(s): "DDIMER" in the last 72 hours. Hgb A1c: No results for input(s): "HGBA1C" in the last 72 hours. Lipid Profile: No results for input(s): "CHOL", "HDL", "LDLCALC", "TRIG", "CHOLHDL", "LDLDIRECT" in the last 72 hours. Thyroid function studies: No results for input(s): "TSH", "T4TOTAL", "T3FREE", "THYROIDAB" in the last 72 hours.  Invalid input(s): "FREET3" Anemia work up: No results for input(s): "VITAMINB12", "FOLATE", "FERRITIN", "TIBC", "IRON", "RETICCTPCT" in the last 72 hours. Sepsis Labs: Recent Labs  Lab 12/20/23 1933 12/21/23 0620  WBC 9.7 10.6*   Microbiology Recent Results (from the past 240 hours)  Surgical pcr screen     Status: None   Collection Time: 12/21/23  5:28 AM   Specimen: Nasal Mucosa; Nasal Swab  Result Value Ref Range Status   MRSA, PCR NEGATIVE NEGATIVE Final   Staphylococcus aureus NEGATIVE NEGATIVE Final    Comment: (NOTE) The Xpert SA Assay (FDA approved for NASAL specimens in patients 42 years of age and older), is one component of a  comprehensive surveillance program. It is not intended to diagnose infection nor to guide or monitor treatment. Performed at [facility], [address], [place], [place] [address]      Medications:    busPIRone  5 mg Oral BID   folic acid  1 mg Oral Daily   gabapentin  300 mg Oral TID    HYDROmorphone (DILAUDID) injection  1 mg Intravenous STAT   Continuous Infusions:  lactated ringers 1,000 mL with potassium chloride 10 mEq infusion 50 mL/hr at 12/20/23 2319      LOS: 0 days   Marinda Elk  [facility]  12/21/2023, 7:52 AM

## 2023-12-21 NOTE — Op Note (Signed)
 Orthopaedic Surgery Operative Note (CSN: [id number] ) Date of Surgery: 12/21/2023  Admit Date: 12/20/2023   Diagnoses: Pre-Op Diagnoses: Right type II open tibial shaft fracture  Post-Op Diagnosis: Same  Procedures: CPT 27759-Intramedullary nailing of right tibia fracture CPT 11012-Irrigation and debridement of right open tibia fracture  Surgeons : Primary: Roby Lofts, MD  Assistant: Thyra Breed, PA-C  Location: OR 12   Anesthesia:General   Antibiotics: Ancef 2g preop with 1gm vancomycin powder placed topically   Tourniquet time: None    Estimated Blood Loss: 30 mL  Complications:None   Specimens:* No specimens in log *   Implants: Implant Name Type Inv. Item Serial No. Manufacturer Lot No. LRB No. Used Action  NAIL TIBIAL [place] [id number] - [id number] Nail NAIL TIBIAL [place] 9.0X320MM  [employer](ORTH,TRAU,BIO,SG) [id number] Right 1 Implanted  SCREW CORT TI DBL LEAD 5X38 - [id number] Screw SCREW CORT TI DBL LEAD 5X38  [employer](ORTH,TRAU,BIO,SG) [id number] Right 1 Implanted  SCREW CORT TI DBL LEAD 5X32 - [id number] Screw SCREW CORT TI DBL LEAD 5X32  [employer](ORTH,TRAU,BIO,SG) [id number] Right 1 Implanted  SCREW CORT TI DBL LEAD 5X30 - [id number] Screw SCREW CORT TI DBL LEAD 5X30  [employer](ORTH,TRAU,BIO,SG) [id number] Right 1 Implanted  SCREW CORT TI DBL LEAD 5X46 - [id number] Screw SCREW CORT TI DBL LEAD 5X46  [employer](ORTH,TRAU,BIO,SG) [id number] Right 1 Implanted     Indications for Surgery: 53 year old female who sustained a fall and a right open distal tibia and fibula fracture.  Due to the open nature of her injury as well as the unstable nature of her injury I recommend proceeding with I&D and intramedullary nailing of the right tibia.  Risks and benefits were discussed with the patient.  Risks include but not limited to bleeding, infection, malunion, nonunion, hardware failure, hardware irritation, nerve and blood vessel injury, DVT, and the  possibility anesthetic complications.  The patient agreed to proceed with surgery consent was obtained.  Operative Findings: Irrigation and debridement of right open tibia fracture with intramedullary nailing using [employer]immer [employer] [place] nail 9 x 320 mm nail  Procedure: The patient was identified in the preoperative holding area. Consent was confirmed with the patient and their family and all questions were answered. The operative extremity was marked after confirmation with the patient. she was then brought back to the operating room by our anesthesia colleagues.  She was placed under general anesthetic and carefully transferred over to radiolucent flattop table.  A bump was placed under her operative hip.  The right lower extremity was then prepped and draped in usual sterile fashion.  A timeout was performed to verify the patient, the procedure, and the extremity.  Preoperative antibiotics were dosed.  I first started out by performing the debridement.  There was no contamination.  The wound was over the anterior medial aspect of the distal tibia.  It was 9 cm in length.  There was some periosteal stripping.  I cleaned out the hematoma debrided the bone ends and then used low-pressure pulsatile lavage to thoroughly irrigate the wound with approximately 3 L of normal saline.  I then changed my gloves and instruments and proceeded to clamp the fracture as it had an oblique fracture line that was amendable to clamp placement.  I then turned my attention to the nailing portion of the procedure.  A lateral parapatellar incision was made and carried down through skin and subcutaneous tissue.  I then released the lateral retinaculum to mobilize patella medially.  I then directed a threaded  guidewire at appropriate starting point and advanced into the proximal metaphysis.  I then used an awl to enter the medullary canal.  I then passed a ball-tipped guidewire down the center of the canal and seated it into the  distal metaphysis.  I then measured the length and chose to use a 320 mm nail.  I then sequentially reamed from 9 mm to 10 mm and obtained excellent chatter.  I then placed a 9 x 320 mm nail down the center of the canal.  Using perfect circle technique I then placed 2 medial to lateral distal interlocking screws.  I then used the targeting arm proximally to place the 2 proximal interlocking screws.  The targeting arm was removed and final fluoroscopic imaging was obtained.  The incisions were copiously irrigated.  A gram of vancomycin powder was placed in the traumatic laceration.  Layered closure of 0 Vicryl, 2-0 Monocryl and 3-0 nylon was used to close the skin.  Sterile dressings were applied.  A short leg splint was then placed.  The patient was then awoke from anesthesia and taken to the PACU in stable condition.   Debridement type: Excisional Debridement  Side: right  Body Location: Tibia  Tools used for debridement: scalpel, curette, and rongeur  Pre-debridement Wound size (cm):   Length: 9        Width: 2     Depth: 1   Post-debridement Wound size (cm):   N/A-closed  Debridement depth beyond dead/damaged tissue down to healthy viable tissue: yes  Tissue layer involved: skin, subcutaneous tissue, muscle / fascia, bone  Nature of tissue removed: Devitalized Tissue  Irrigation volume: 3L     Irrigation fluid type: Normal Saline   Post Op Plan/Instructions: Patient will be nonweightbearing to the right lower extremity.  She will be admitted for IV antibiotics and physical therapy.  She will receive postoperative Ancef.  She will receive Lovenox for DVT prophylaxis while inpatient and discharged on aspirin 81 mg.  We will have her mobilize with physical and Occupational Therapy.  I was present and performed the entire surgery.  Thyra Breed, PA-C did assist me throughout the case. An assistant was necessary given the difficulty in approach, maintenance of reduction and ability to  instrument the fracture.   Truitt Merle, MD [facility]

## 2023-12-21 NOTE — Transfer of Care (Signed)
 Immediate Anesthesia Transfer of Care Note  Patient: Annette Lucas  Procedure(s) Performed: IRRIGATION AND DEBRIDEMENT WOUND (Right: Ankle) EXTERNAL FIXATION, LOWER EXTREMITY (Right: Ankle)  Patient Location: PACU  Anesthesia Type:General  Level of Consciousness: awake and alert   Airway & Oxygen Therapy: Patient Spontanous Breathing and Patient connected to face mask oxygen  Post-op Assessment: Report given to RN and Post -op Vital signs reviewed and stable  Post vital signs: Reviewed and stable  Last Vitals:  Vitals Value Taken Time  BP 136/82 12/21/23 1716  Temp 36.6 C 12/21/23 1715  Pulse 87 12/21/23 1721  Resp 25 12/21/23 1721  SpO2 95 % 12/21/23 1721  Vitals shown include unfiled device data.  Last Pain:  Vitals:   12/21/23 1715  TempSrc:   PainSc: 2          Complications: No notable events documented.

## 2023-12-21 NOTE — Consult Note (Signed)
 Reason for Consult:Open right ankle fx Referring Physician: Marinda Elk Time called: 0730 Time at bedside: 1601   Annette Lucas is an 53 y.o. female.  HPI: Annette Lucas slipped going into her bathroom last night and hit her right foot on the doorframe. She had immediate ankle pain and a large wound and could not bear weight. She was brought to the ED where x-rays showed an open ankle fx and orthopedic surgery was consulted. She lives at home alone and works from home. She does not use any assistive devices to ambulate.  Past Medical History:  Diagnosis Date   Allergy to meat    GERD (gastroesophageal reflux disease)    IBS (irritable bowel syndrome)     Past Surgical History:  Procedure Laterality Date   CYSTOSCOPY  07/06/2011   Procedure: CYSTOSCOPY;  Surgeon: Zenaida Niece, MD;  Location: [facility];  Service: Gynecology;  Laterality: N/A;   ENDOMETRIAL ABLATION     LAPAROSCOPIC SALPINGO OOPHERECTOMY Bilateral 09/01/2016   Procedure: LAPAROSCOPIC SALPINGO OOPHORECTOMY;  Surgeon: Lavina Hamman, MD;  Location: [facility];  Service: Gynecology;  Laterality: Bilateral;   VAGINAL HYSTERECTOMY  07/06/2011   Procedure: HYSTERECTOMY VAGINAL;  Surgeon: Zenaida Niece, MD;  Location: [facility];  Service: Gynecology;  Laterality: N/A;    History reviewed. No pertinent family history.  Social History:  reports that she has never smoked. She has never used smokeless tobacco. She reports current alcohol use. She reports that she does not use drugs.  Allergies:  Allergies  Allergen Reactions   Levofloxacin Anaphylaxis   Other Anaphylaxis    Meat allergy: Beef, pork, lamb   Penicillins Anaphylaxis    Has patient had a PCN reaction causing immediate rash, facial/tongue/throat swelling, SOB or lightheadedness with hypotension: Yes Has patient had a PCN reaction causing severe rash involving mucus membranes or skin necrosis: No Has patient had a PCN reaction that required hospitalization Yes Has  patient had a PCN reaction occurring within the last 10 years: No If all of the above answers are "NO", then may proceed with Cephalosporin use.     Medications: I have reviewed the patient's current medications.  Results for orders placed or performed during the hospital encounter of 12/20/23 (from the past 48 hours)  Basic metabolic panel     Status: Abnormal   Collection Time: 12/20/23  7:33 PM  Result Value Ref Range   Sodium 145 135 - 145 mmol/L   Potassium 3.1 (L) 3.5 - 5.1 mmol/L   Chloride 113 (H) 98 - 111 mmol/L   CO2 22 22 - 32 mmol/L   Glucose, Bld 87 70 - 99 mg/dL    Comment: Glucose reference range applies only to samples taken after fasting for at least 8 hours.   BUN <5 (L) 6 - 20 mg/dL   Creatinine, Ser 0.93 0.44 - 1.00 mg/dL   Calcium 8.2 (L) 8.9 - 10.3 mg/dL   GFR, Estimated >23 >55 mL/min    Comment: (NOTE) Calculated using the CKD-EPI Creatinine Equation (2021)    Anion gap 10 5 - 15    Comment: Performed at [facility], [address] [place], [place] [address]  CBC with Differential     Status: Abnormal   Collection Time: 12/20/23  7:33 PM  Result Value Ref Range   WBC 9.7 4.0 - 10.5 K/uL   RBC 3.77 (L) 3.87 - 5.11 MIL/uL   Hemoglobin 12.7 12.0 - 15.0 g/dL   HCT 25.4 27.0 - 62.3 %  MCV 97.9 80.0 - 100.0 fL   MCH 33.7 26.0 - 34.0 pg   MCHC 34.4 30.0 - 36.0 g/dL   RDW 40.9 81.1 - 91.4 %   Platelets 227 150 - 400 K/uL   nRBC 0.0 0.0 - 0.2 %   Neutrophils Relative % 79 %   Neutro Abs 7.6 1.7 - 7.7 K/uL   Lymphocytes Relative 15 %   Lymphs Abs 1.5 0.7 - 4.0 K/uL   Monocytes Relative 5 %   Monocytes Absolute 0.5 0.1 - 1.0 K/uL   Eosinophils Relative 0 %   Eosinophils Absolute 0.0 0.0 - 0.5 K/uL   Basophils Relative 1 %   Basophils Absolute 0.1 0.0 - 0.1 K/uL   Immature Granulocytes 0 %   Abs Immature Granulocytes 0.04 0.00 - 0.07 K/uL    Comment: Performed at [facility], [address], [place], [place] [address]  Protime-INR      Status: None   Collection Time: 12/20/23  7:33 PM  Result Value Ref Range   Prothrombin Time 13.1 11.4 - 15.2 seconds   INR 1.0 0.8 - 1.2    Comment: (NOTE) INR goal varies based on device and disease states. Performed at [facility], [address] [place], [place] [address]   Surgical pcr screen     Status: None   Collection Time: 12/21/23  5:28 AM   Specimen: Nasal Mucosa; Nasal Swab  Result Value Ref Range   MRSA, PCR NEGATIVE NEGATIVE   Staphylococcus aureus NEGATIVE NEGATIVE    Comment: (NOTE) The Xpert SA Assay (FDA approved for NASAL specimens in patients 71 years of age and older), is one component of a comprehensive surveillance program. It is not intended to diagnose infection nor to guide or monitor treatment. Performed at [facility], [address], [place], [place] [address]   CBC     Status: Abnormal   Collection Time: 12/21/23  6:20 AM  Result Value Ref Range   WBC 10.6 (H) 4.0 - 10.5 K/uL   RBC 3.70 (L) 3.87 - 5.11 MIL/uL   Hemoglobin 12.5 12.0 - 15.0 g/dL   HCT 46.9 62.9 - 52.8 %   MCV 100.3 (H) 80.0 - 100.0 fL   MCH 33.8 26.0 - 34.0 pg   MCHC 33.7 30.0 - 36.0 g/dL   RDW 41.3 24.4 - 01.0 %   Platelets 241 150 - 400 K/uL   nRBC 0.0 0.0 - 0.2 %    Comment: Performed at [facility], [address] [place], [place] [address]  Basic metabolic panel     Status: Abnormal   Collection Time: 12/21/23  6:20 AM  Result Value Ref Range   Sodium 143 135 - 145 mmol/L   Potassium 3.7 3.5 - 5.1 mmol/L   Chloride 108 98 - 111 mmol/L   CO2 26 22 - 32 mmol/L   Glucose, Bld 104 (H) 70 - 99 mg/dL    Comment: Glucose reference range applies only to samples taken after fasting for at least 8 hours.   BUN 5 (L) 6 - 20 mg/dL   Creatinine, Ser 6.64 0.44 - 1.00 mg/dL   Calcium 8.5 (L) 8.9 - 10.3 mg/dL   GFR, Estimated >40 >34 mL/min    Comment: (NOTE) Calculated using the CKD-EPI Creatinine Equation (2021)    Anion gap 9 5 - 15    Comment:  Performed at [facility], [address] [place], [place] [address]  Magnesium  Status: Abnormal   Collection Time: 12/21/23  6:20 AM  Result Value Ref Range   Magnesium 1.6 (L) 1.7 - 2.4 mg/dL    Comment: Performed at [facility], [address], [place], [place] [address]  Phosphorus     Status: None   Collection Time: 12/21/23  6:20 AM  Result Value Ref Range   Phosphorus 3.3 2.5 - 4.6 mg/dL    Comment: Performed at [facility], [address] [place], [place] [address]  HIV Antibody (routine testing w rflx)     Status: None   Collection Time: 12/21/23  6:20 AM  Result Value Ref Range   HIV Screen 4th Generation wRfx Non Reactive Non Reactive    Comment: Performed at [facility], [address], [place], [place] [address]    DG Ankle Complete Right Result Date: 12/20/2023 CLINICAL DATA:  Status post reduction of right ankle fractures. EXAM: RIGHT ANKLE - COMPLETE 3+ VIEW COMPARISON:  Same day. FINDINGS: The right ankle has been casted and immobilized. There is continued presence of moderately angulated and comminuted fractures involving the distal right tibia and fibula. IMPRESSION: Moderately angulated and comminuted distal right tibial and fibular fractures. Electronically Signed   By: Lupita Raider M.D.   On: 12/20/2023 20:27   DG Ankle Right Port Result Date: 12/20/2023 CLINICAL DATA:  Right ankle laceration after injury. EXAM: PORTABLE RIGHT ANKLE - 2 VIEW COMPARISON:  None Available. FINDINGS: Only a single lateral image was obtained. This demonstrates severely displaced fractures involving the distal right tibia and fibula. IMPRESSION: Severely displaced distal right tibial and fibular fractures. Electronically Signed   By: Lupita Raider M.D.   On: 12/20/2023 19:10    Review of Systems  HENT:  Negative for ear discharge, ear pain, hearing loss and tinnitus.   Eyes:  Negative for photophobia and pain.  Respiratory:  Negative for cough and  shortness of breath.   Cardiovascular:  Negative for chest pain.  Gastrointestinal:  Negative for abdominal pain, nausea and vomiting.  Genitourinary:  Negative for dysuria, flank pain, frequency and urgency.  Musculoskeletal:  Positive for arthralgias (Right ankle). Negative for back pain, myalgias and neck pain.  Neurological:  Negative for dizziness and headaches.  Hematological:  Does not bruise/bleed easily.  Psychiatric/Behavioral:  The patient is not nervous/anxious.    Blood pressure 131/77, pulse 92, temperature 99.1 F (37.3 C), temperature source Oral, resp. rate 18, height 5\' 4"  (1.626 m), weight 61.2 kg, last menstrual period 06/14/2011, SpO2 100%. Physical Exam Constitutional:      General: She is not in acute distress.    Appearance: She is well-developed. She is not diaphoretic.  HENT:     Head: Normocephalic and atraumatic.  Eyes:     General: No scleral icterus.       Right eye: No discharge.        Left eye: No discharge.     Conjunctiva/sclera: Conjunctivae normal.  Cardiovascular:     Rate and Rhythm: Normal rate and regular rhythm.  Pulmonary:     Effort: Pulmonary effort is normal. No respiratory distress.  Musculoskeletal:     Cervical back: Normal range of motion.     Comments: RLE No traumatic wounds, ecchymosis, or rash  Short leg splint in place  No knee effusion  Knee stable to varus/ valgus and anterior/posterior stress  Sens DPN, SPN, TN paresthetic (baseline)  Motor EHL 5/5  Toes perfused, No significant edema  Skin:  General: Skin is warm and dry.  Neurological:     Mental Status: She is alert.  Psychiatric:        Mood and Affect: Mood normal.        Behavior: Behavior normal.     Assessment/Plan: Open right ankle fx -- Plan I&D, ORIF today with Dr. Linna Caprice. Please keep NPO.    Freeman Caldron, PA-C Orthopedic Surgery [phone] 12/21/2023, 9:44 AM

## 2023-12-21 NOTE — Anesthesia Preprocedure Evaluation (Addendum)
 Anesthesia Evaluation  Patient identified by MRN, date of birth, ID band Patient awake    Reviewed: Allergy & Precautions, NPO status , Patient's Chart, lab work & pertinent test results, reviewed documented beta blocker date and time   History of Anesthesia Complications Negative for: history of anesthetic complications  Airway Mallampati: I  TM Distance: >3 FB     Dental no notable dental hx.    Pulmonary neg COPD, neg PE   breath sounds clear to auscultation       Cardiovascular hypertension, (-) CAD, (-) Past MI, (-) Cardiac Stents and (-) CABG  Rhythm:Regular Rate:Normal     Neuro/Psych   Anxiety      Neuromuscular disease CVA, Residual Symptoms    GI/Hepatic ,GERD  Medicated,,  Endo/Other    Renal/GU Renal disease     Musculoskeletal   Abdominal   Peds  Hematology  (+) Blood dyscrasia, anemia   Anesthesia Other Findings   Reproductive/Obstetrics                             Anesthesia Physical Anesthesia Plan  ASA: 2  Anesthesia Plan: General   Post-op Pain Management:    Induction: Intravenous  PONV Risk Score and Plan: 2 and Ondansetron and Dexamethasone  Airway Management Planned: Oral ETT  Additional Equipment:   Intra-op Plan:   Post-operative Plan: Extubation in OR  Informed Consent: I have reviewed the patients History and Physical, chart, labs and discussed the procedure including the risks, benefits and alternatives for the proposed anesthesia with the patient or authorized representative who has indicated his/her understanding and acceptance.     Dental advisory given  Plan Discussed with: CRNA  Anesthesia Plan Comments:        Anesthesia Quick Evaluation

## 2023-12-22 ENCOUNTER — Encounter (HOSPITAL_COMMUNITY): Payer: Self-pay | Admitting: Student

## 2023-12-22 DIAGNOSIS — S82251A Displaced comminuted fracture of shaft of right tibia, initial encounter for closed fracture: Secondary | ICD-10-CM | POA: Diagnosis not present

## 2023-12-22 LAB — CBC
HCT: 37.2 % (ref 36.0–46.0)
Hemoglobin: 12.5 g/dL (ref 12.0–15.0)
MCH: 33.4 pg (ref 26.0–34.0)
MCHC: 33.6 g/dL (ref 30.0–36.0)
MCV: 99.5 fL (ref 80.0–100.0)
Platelets: 260 10*3/uL (ref 150–400)
RBC: 3.74 MIL/uL — ABNORMAL LOW (ref 3.87–5.11)
RDW: 15 % (ref 11.5–15.5)
WBC: 14 10*3/uL — ABNORMAL HIGH (ref 4.0–10.5)
nRBC: 0 % (ref 0.0–0.2)

## 2023-12-22 LAB — BASIC METABOLIC PANEL WITH GFR
Anion gap: 10 (ref 5–15)
BUN: 17 mg/dL (ref 6–20)
CO2: 29 mmol/L (ref 22–32)
Calcium: 8.8 mg/dL — ABNORMAL LOW (ref 8.9–10.3)
Chloride: 100 mmol/L (ref 98–111)
Creatinine, Ser: 0.98 mg/dL (ref 0.44–1.00)
GFR, Estimated: >60 mL/min (ref >60)
Glucose, Bld: 157 mg/dL — ABNORMAL HIGH (ref 70–99)
Potassium: 4 mmol/L (ref 3.5–5.1)
Sodium: 139 mmol/L (ref 135–145)

## 2023-12-22 LAB — VITAMIN D 25 HYDROXY (VIT D DEFICIENCY, FRACTURES): Vit D, 25-Hydroxy: 38.13 ng/mL (ref 30–100)

## 2023-12-22 MED ORDER — METHOCARBAMOL 500 MG PO TABS
500.0000 mg | ORAL_TABLET | Freq: Four times a day (QID) | ORAL | Status: DC
  Administered 2023-12-22 – 2023-12-24 (×9): 500 mg via ORAL
  Filled 2023-12-22 (×9): qty 1

## 2023-12-22 MED ORDER — ASPIRIN 325 MG PO TBEC: 325.0000 mg | DELAYED_RELEASE_TABLET | Freq: Every day | ORAL | 0 refills | Status: DC

## 2023-12-22 MED ORDER — OXYCODONE HCL 5 MG PO TABS
5.0000 mg | ORAL_TABLET | ORAL | Status: DC | PRN
  Administered 2023-12-22 – 2023-12-24 (×2): 5 mg via ORAL
  Filled 2023-12-22 (×2): qty 1

## 2023-12-22 MED ORDER — OXYCODONE HCL 5 MG PO TABS: 5.0000 mg | ORAL_TABLET | ORAL | 0 refills | Status: DC | PRN

## 2023-12-22 MED ORDER — METHOCARBAMOL 500 MG PO TABS: 500.0000 mg | ORAL_TABLET | Freq: Four times a day (QID) | ORAL | 0 refills | Status: DC | PRN

## 2023-12-22 MED ORDER — KETOROLAC TROMETHAMINE 15 MG/ML IJ SOLN
15.0000 mg | Freq: Four times a day (QID) | INTRAMUSCULAR | Status: AC
  Administered 2023-12-22 – 2023-12-23 (×5): 15 mg via INTRAVENOUS
  Filled 2023-12-22 (×5): qty 1

## 2023-12-22 MED ORDER — METHOCARBAMOL 1000 MG/10ML IJ SOLN: 500.0000 mg | Freq: Four times a day (QID) | INTRAMUSCULAR | Status: DC

## 2023-12-22 MED ORDER — ACETAMINOPHEN 325 MG PO TABS: 650.0000 mg | ORAL_TABLET | Freq: Four times a day (QID) | ORAL | Status: DC | PRN

## 2023-12-22 NOTE — Evaluation (Signed)
 Occupational Therapy Evaluation Patient Details Name: Annette Lucas MRN: [id number] DOB: 03/25/1971 Today's Date: 12/22/2023   History of Present Illness   Pt is a 53 y.o. female admitted 12/20/23 with R ankle laceration and pain after slip in home. X-ray showed distal tibial shaft fx. S/p R I & D and Intramedullary nailing of right tibia fracture performed 3/26. NWB. PMH: neuropathy, colitis     Clinical Impressions Pt admitted based on above, and was seen based on problem list below. PTA pt was living alone and was independent with ADLs/IADLs and working from home. Today pt is requiring set up  to total for  ADLs. Bed mobility was min assist and functional transfers are min assist +2 for safety. Pt requiring heavy cueing for safety during functional transfers. Pt's impulsivity, poor safety awareness, and judgement preventing safe d/c home.  Recommendation of >3 hours of skilled rehab daily to return to PLOF. If pt is to d/c home, would recommend at wc level with close supervision during all transfers.OT will continue to follow acutely to maximize functional independence.        If plan is discharge home, recommend the following:   A lot of help with bathing/dressing/bathroom;Assistance with cooking/housework;Assist for transportation;Help with stairs or ramp for entrance;Supervision due to cognitive status;A lot of help with walking and/or transfers     Functional Status Assessment   Patient has had a recent decline in their functional status and demonstrates the ability to make significant improvements in function in a reasonable and predictable amount of time.     Equipment Recommendations   None recommended by OT (All DME needs met)     Recommendations for Other Services   Rehab consult     Precautions/Restrictions   Precautions Precautions: Fall Recall of Precautions/Restrictions: Intact Required Braces or Orthoses: Splint/Cast Splint/Cast:  RLE Restrictions Weight Bearing Restrictions Per Provider Order: Yes RLE Weight Bearing Per Provider Order: Non weight bearing     Mobility Bed Mobility Overal bed mobility: Needs Assistance Bed Mobility: Supine to Sit     Supine to sit: Min assist, HOB elevated, Used rails     General bed mobility comments: Assist manage RLE    Transfers Overall transfer level: Needs assistance Equipment used: Rolling [place] (2 wheels) Transfers: Sit to/from Stand, Bed to chair/wheelchair/BSC Sit to Stand: Min assist, From elevated surface Stand pivot transfers: Min assist, +2 safety/equipment, From elevated surface         General transfer comment: Min assist +2 to manage RW, heavy cues for safety      Balance Overall balance assessment: Needs assistance Sitting-balance support: No upper extremity supported, Feet supported Sitting balance-Leahy Scale: Fair     Standing balance support: Bilateral upper extremity supported, Reliant on assistive device for balance, During functional activity Standing balance-Leahy Scale: Poor Standing balance comment: Reliant on RW           ADL either performed or assessed with clinical judgement   ADL Overall ADL's : Needs assistance/impaired Eating/Feeding: Set up;Sitting   Grooming: Set up;Sitting           Upper Body Dressing : Set up;Sitting   Lower Body Dressing: Moderate assistance;Sit to/from stand;Cueing for safety;Cueing for sequencing Lower Body Dressing Details (indicate cue type and reason): Pt able to cross legs to don socks, would require assitance for pants Toilet Transfer: Minimal assistance;+2 for safety/equipment;Cueing for safety;Cueing for sequencing;Stand-pivot;BSC/3in1;Rolling [place] (2 wheels) Toilet Transfer Details (indicate cue type and reason): SPT to [facility] +2 for managing [place] Toileting-  Clothing Manipulation and Hygiene: Total assistance;+2 for safety/equipment;Sit to/from stand Toileting - Clothing  Manipulation Details (indicate cue type and reason): Pt unable to balance standing to wipe even with RW     Functional mobility during ADLs: Minimal assistance;+2 for safety/equipment;Rolling [place] (2 wheels);+2 for physical assistance General ADL Comments: Poor safety/judgement     Vision Baseline Vision/History: 0 No visual deficits              Pertinent Vitals/Pain Pain Assessment Pain Assessment: 0-10 Pain Score: 8  Pain Location: RLE Pain Descriptors / Indicators: Aching, Sore Pain Intervention(s): Monitored during session, Premedicated before session, Repositioned, Limited activity within patient's tolerance     Extremity/Trunk Assessment Upper Extremity Assessment Upper Extremity Assessment: Defer to OT evaluation RUE Deficits / Details: 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 not fully tested at foot due to splint/NWB. pt reports chronically decreased senation in foot, is able to move toes. RLE: Unable to fully assess due to pain;Unable to fully assess due to immobilization RLE Sensation: decreased light touch;history of peripheral neuropathy LLE Deficits / Details: grossly functional, hx of peripheral neuropathy LLE Sensation: history of peripheral neuropathy   Cervical / Trunk Assessment Cervical / Trunk Assessment: Normal   Communication Communication Communication: No apparent difficulties   Cognition Arousal: Alert Behavior During Therapy: Impulsive Cognition: Cognition impaired     Awareness: Intellectual awareness intact, Online awareness impaired    Attention impairment (select first level of impairment): Selective attention Executive functioning impairment (select all impairments): Reasoning, Problem solving, Sequencing OT - Cognition Comments: Pt impulsive, poor safety awareness, difficulty problem solving and sequencing transfers         Following commands: Intact       Cueing  General Comments   Cueing Techniques: Verbal cues;Tactile cues;Gestural cues  VSS on RA   Exercises     Shoulder Instructions      Home Living Family/patient expects to be discharged to:: Private residence Living Arrangements: Alone Available Help at Discharge: Family;Available PRN/intermittently Type of Home: House Home Access: Stairs to enter [employer] of Steps: 2 Entrance Stairs-Rails: Right Home Layout: One level     Bathroom Shower/Tub: Producer, television/film/video: Standard Bathroom Accessibility: Yes (Difficulty turning) How Accessible: Accessible via Bhardwaj Home Equipment: Rolling [place] (2 wheels);Rollator (4 wheels);BSC/3in1;Shower seat;Grab bars - toilet;Grab bars - tub/shower;Hand held shower head   Additional Comments: Not driving friends/family to drive      Prior Functioning/Environment Prior Level of Function : Independent/Modified Independent             Mobility Comments: no use of DME, had just finished 8 months of PT (OPPT and OT) after pt reports of stroke, no hx of stroke in chart. ADLs Comments: pt reports independence    OT Problem List: Decreased strength;Decreased range of motion;Decreased activity tolerance;Impaired balance (sitting and/or standing);Decreased coordination;Decreased safety awareness   OT Treatment/Interventions: Self-care/ADL training;Therapeutic exercise;DME and/or AE instruction;Therapeutic activities;Patient/family education;Balance training      OT Goals(Current goals can be found in the care plan section)   Acute Rehab OT Goals Patient Stated Goal: To go  home OT Goal Formulation: With patient Time For Goal  Achievement: 01/05/24 Potential to Achieve Goals: Good   OT Frequency:  Min 2X/week    Co-evaluation PT/OT/SLP Co-Evaluation/Treatment: Yes Reason for Co-Treatment: For patient/therapist safety;To address functional/ADL transfers PT goals addressed during session: Mobility/safety with mobility;Balance;Proper use of DME;Strengthening/ROM OT goals addressed during session: ADL's and self-care;Proper use of Adaptive equipment and DME      AM-PAC OT "6 Clicks" Daily Activity     Outcome Measure Help from another person eating meals?: None Help from another person taking care of personal grooming?: A Little Help from another person toileting, which includes using toliet, bedpan, or urinal?: Total Help from another person bathing (including washing, rinsing, drying)?: A Lot Help from another person to put on and taking off regular upper body clothing?: A Little Help from another person to put on and taking off regular lower body clothing?: A Lot 6 Click Score: 15   End of Session Equipment Utilized During Treatment: Gait belt;Rolling [place] (2 wheels) Nurse Communication: Mobility status  Activity Tolerance: Patient tolerated treatment well Patient left: in chair;with call bell/phone within reach;with chair alarm set  OT Visit Diagnosis: Unsteadiness on feet (R26.81);Other abnormalities of gait and mobility (R26.89);Muscle weakness (generalized) (M62.81)                Time: 1610-9604 OT Time Calculation (min): 39 min Charges:  OT General Charges $OT Visit: 1 Visit OT Evaluation $OT Eval Moderate Complexity: 1 Mod  Laithan Conchas C, OT  Acute Rehabilitation Services Office [phone] Secure chat preferred   Marilynne Drivers 12/22/2023, 1:31 PM

## 2023-12-22 NOTE — Progress Notes (Signed)
  Inpatient Rehab Admissions Coordinator :  Per therapy recommendations, patient was screened for CIR candidacy by Ottie Glazier RN MSN.  Will need full assessment for medical necessity evaluation for AIR level rehab.  I will place a rehab consult per protocol for full assessment. Please call me with any questions.  Ottie Glazier RN MSN Admissions Coordinator [phone]

## 2023-12-22 NOTE — Evaluation (Signed)
 Physical Therapy Evaluation Patient Details Name: Annette Lucas MRN: [id number] DOB: 07-12-1971 Today's Date: 12/22/2023  History of Present Illness  Pt is a 53 y.o. female admitted 12/20/23 with R ankle laceration and pain after slip in home. X-ray showed distal tibial shaft fx. S/p R I & D and Intramedullary nailing of right tibia fracture performed 3/26. NWB. PMH: neuropathy, colitis  Clinical Impression  Pt in bed upon arrival of PT, agreeable to evaluation at this time. Prior to admission the pt was living alone, ambulating without need for assistance or DME, and working from home. She reports no other falls and that she had just finished 8 months of OPPT and OPOT due to bilateral leg numbness. The pt needed minA to manage bed mobility and minA of 2 with use of RW with max cues, assist to manage RW, and to correct LOB of pt. Pt unable to progress to gait training due to pain levels and poor balance with impulsivity. Therefore, I feel she could benefit from short bout of intensive therapies to facilitate return to maximal independence with transfers and household distance ambulation to allow for safest return home. The pt is highly motivated and has great family support.   If she were to d/c home at this point, she will need to do so on a WC level with family present for all OOB mobility and transfers. We will continue to follow acutely and attempt gait progression and stair training (pt has 2 stairs to enter her home) tomorrow.     If plan is discharge home, recommend the following: A lot of help with walking and/or transfers;A lot of help with bathing/dressing/bathroom;Assistance with cooking/housework;Help with stairs or ramp for entrance;Supervision due to cognitive status   Can travel by private vehicle        Equipment Recommendations  (tub bench, pt is having family check if they have a WC with elevating leg rests)  Recommendations for Other Services  Rehab consult    Functional  Status Assessment Patient has had a recent decline in their functional status and demonstrates the ability to make significant improvements in function in a reasonable and predictable amount of time.     Precautions / Restrictions Precautions Precautions: Fall Recall of Precautions/Restrictions: Intact Required Braces or Orthoses: Splint/Cast Splint/Cast: RLE Restrictions Weight Bearing Restrictions Per Provider Order: Yes RLE Weight Bearing Per Provider Order: Non weight bearing      Mobility  Bed Mobility Overal bed mobility: Needs Assistance Bed Mobility: Supine to Sit     Supine to sit: Min assist, Used rails, HOB elevated     General bed mobility comments: minA to manage RLE, pt using bed rails to complete scooting to EOB    Transfers Overall transfer level: Needs assistance Equipment used: Rolling Osten (2 wheels) Transfers: Sit to/from Stand, Bed to chair/wheelchair/BSC Sit to Stand: Min assist, From elevated surface Stand pivot transfers: Min assist, +2 safety/equipment         General transfer comment: minA with max cues to stand and rise from EOB, minA to manage RW, and to both manage balance and movement of RW for pivot. completed x2 in session with cues and assist each time due to pt's impulsivity    Ambulation/Gait             Pre-gait activities: small pivotal steps with RW, minA of 2.        Balance Overall balance assessment: Needs assistance Sitting-balance support: No upper extremity supported, Feet supported Sitting balance-Leahy Scale: Fair  Standing balance support: Bilateral upper extremity supported, During functional activity Standing balance-Leahy Scale: Poor Standing balance comment: dependent on BUE support and external support due to impuslivity                             Pertinent Vitals/Pain Pain Assessment Pain Assessment: 0-10 Pain Score: 8  Pain Location: R ankle/knee Pain Descriptors / Indicators:  Aching, Sore Pain Intervention(s): Limited activity within patient's tolerance, Monitored during session, Premedicated before session, Repositioned    Home Living Family/patient expects to be discharged to:: Private residence Living Arrangements: Alone Available Help at Discharge: Family;Available PRN/intermittently Type of Home: House Home Access: Stairs to enter Entrance Stairs-Rails: Right Entrance Stairs-Number of Steps: 2   Home Layout: One level Home Equipment: Agricultural consultant (2 wheels);Rollator (4 wheels);BSC/3in1;Shower seat;Grab bars - toilet;Grab bars - tub/shower;Hand held shower head Additional Comments: Not driving friends/family to drive    Prior Function Prior Level of Function : Independent/Modified Independent             Mobility Comments: no use of DME, had just finished 8 months of PT (OPPT and OT) after pt reports of stroke, no hx of stroke in chart. ADLs Comments: pt reports independence     Extremity/Trunk Assessment   Upper Extremity Assessment Upper Extremity Assessment: Defer to OT evaluation    Lower Extremity Assessment Lower Extremity Assessment: RLE deficits/detail;LLE deficits/detail RLE Deficits / Details: limited ROM at knee due to pain, limited hip flexion due to pain in knee. not fully tested at foot due to splint/NWB. pt reports chronically decreased senation in foot, is able to move toes. RLE: Unable to fully assess due to pain;Unable to fully assess due to immobilization RLE Sensation: decreased light touch;history of peripheral neuropathy LLE Deficits / Details: grossly functional, hx of peripheral neuropathy LLE Sensation: history of peripheral neuropathy    Cervical / Trunk Assessment Cervical / Trunk Assessment: Normal  Communication   Communication Communication: No apparent difficulties    Cognition Arousal: Alert Behavior During Therapy: Impulsive   PT - Cognitive impairments: No family/caregiver present to determine  baseline, Problem solving, Safety/Judgement                       PT - Cognition Comments: pt impulsive with mobility, not following directions well in session, could be due to pain Following commands: Intact       Cueing Cueing Techniques: Verbal cues     General Comments General comments (skin integrity, edema, etc.): VSS on RA    Exercises     Assessment/Plan    PT Assessment Patient needs continued PT services  PT Problem List Decreased strength;Decreased activity tolerance;Decreased range of motion;Decreased balance;Decreased mobility;Decreased safety awareness;Decreased knowledge of precautions;Pain       PT Treatment Interventions DME instruction;Gait training;Stair training;Functional mobility training;Therapeutic activities;Therapeutic exercise;Balance training;Cognitive remediation;Patient/family education;Wheelchair mobility training    PT Goals (Current goals can be found in the Care Plan section)  Acute Rehab PT Goals Patient Stated Goal: to return safely home PT Goal Formulation: With patient Time For Goal Achievement: 01/05/24 Potential to Achieve Goals: Good    Frequency Min 3X/week     Co-evaluation PT/OT/SLP Co-Evaluation/Treatment: Yes Reason for Co-Treatment: For patient/therapist safety;To address functional/ADL transfers PT goals addressed during session: Mobility/safety with mobility;Balance;Proper use of DME;Strengthening/ROM         AM-PAC PT "6 Clicks" Mobility  Outcome Measure Help needed turning from your back to your  side while in a flat bed without using bedrails?: A Little Help needed moving from lying on your back to sitting on the side of a flat bed without using bedrails?: A Little Help needed moving to and from a bed to a chair (including a wheelchair)?: A Lot Help needed standing up from a chair using your arms (e.g., wheelchair or bedside chair)?: A Lot Help needed to walk in hospital room?: Total (<20 ft) Help needed  climbing 3-5 steps with a railing? : Total 6 Click Score: 12    End of Session Equipment Utilized During Treatment: Gait belt Activity Tolerance: Patient limited by pain Patient left: in chair;with call bell/phone within reach;with chair alarm set Nurse Communication: Mobility status PT Visit Diagnosis: Unsteadiness on feet (R26.81);Muscle weakness (generalized) (M62.81);Pain Pain - Right/Left: Right Pain - part of body: Ankle and joints of foot;Knee    Time: 1030-1110 PT Time Calculation (min) (ACUTE ONLY): 40 min   Charges:   PT Evaluation $PT Eval Moderate Complexity: 1 Mod PT Treatments $Therapeutic Activity: 8-22 mins PT General Charges $$ ACUTE PT VISIT: 1 Visit         Vickki Muff, PT, DPT   Acute Rehabilitation Department Office [phone] Secure Chat Communication Preferred  Ronnie Derby 12/22/2023, 12:22 PM

## 2023-12-22 NOTE — Progress Notes (Signed)
 Orthopaedic Trauma Progress Note  SUBJECTIVE: Doing ok this AM.  Notes moderate pain throughout the right lower extremity, most noticeable by the knee.  Pain medications helping some, will add Toradol to regimen today.  Patient with neuropathy at baseline, but denies any worsening or new numbness/tingling to the right leg.  Tolerating diet and fluids.  No chest pain. No SOB. No nausea/vomiting. No other complaints.   OBJECTIVE:  Vitals:   12/22/23 0436 12/22/23 0745  BP: (!) 91/59 106/68  Pulse: 71 72  Resp: 15 17  Temp: 97.6 F (36.4 C) 97.9 F (36.6 C)  SpO2: 93% 93%    General: Sitting up in bed, no acute distress Respiratory: No increased work of breathing.  RLE: Well padded, well fitting splint in place.  Nontender above splint.  Able to wiggle the toes.  Endorses sensation light touch over the toes, although diminished due to baseline neuropathy.  Motor and sensory exam throughout the ankle and foot limited secondary to splint placement.  Toes warm well-perfused.  IMAGING: Stable post op imaging.   LABS:  Results for orders placed or performed during the hospital encounter of 12/20/23 (from the past 24 hours)  CBC     Status: Abnormal   Collection Time: 12/21/23  6:30 PM  Result Value Ref Range   WBC 12.0 (H) 4.0 - 10.5 K/uL   RBC 3.95 3.87 - 5.11 MIL/uL   Hemoglobin 13.4 12.0 - 15.0 g/dL   HCT 16.1 09.6 - 04.5 %   MCV 100.3 (H) 80.0 - 100.0 fL   MCH 33.9 26.0 - 34.0 pg   MCHC 33.8 30.0 - 36.0 g/dL   RDW 40.9 81.1 - 91.4 %   Platelets 243 150 - 400 K/uL   nRBC 0.0 0.0 - 0.2 %  Creatinine, serum     Status: None   Collection Time: 12/21/23  6:30 PM  Result Value Ref Range   Creatinine, Ser 0.77 0.44 - 1.00 mg/dL   GFR, Estimated >78 >29 mL/min  Basic metabolic panel     Status: Abnormal   Collection Time: 12/22/23  6:04 AM  Result Value Ref Range   Sodium 139 135 - 145 mmol/L   Potassium 4.0 3.5 - 5.1 mmol/L   Chloride 100 98 - 111 mmol/L   CO2 29 22 - 32 mmol/L    Glucose, Bld 157 (H) 70 - 99 mg/dL   BUN 17 6 - 20 mg/dL   Creatinine, Ser 5.62 0.44 - 1.00 mg/dL   Calcium 8.8 (L) 8.9 - 10.3 mg/dL   GFR, Estimated >13 >08 mL/min   Anion gap 10 5 - 15  CBC     Status: Abnormal   Collection Time: 12/22/23  6:04 AM  Result Value Ref Range   WBC 14.0 (H) 4.0 - 10.5 K/uL   RBC 3.74 (L) 3.87 - 5.11 MIL/uL   Hemoglobin 12.5 12.0 - 15.0 g/dL   HCT 65.7 84.6 - 96.2 %   MCV 99.5 80.0 - 100.0 fL   MCH 33.4 26.0 - 34.0 pg   MCHC 33.6 30.0 - 36.0 g/dL   RDW 95.2 84.1 - 32.4 %   Platelets 260 150 - 400 K/uL   nRBC 0.0 0.0 - 0.2 %    ASSESSMENT: Cristianna Cyr is a 53 y.o. female, 1 Day Post-Op s/p IRRIGATION AND DEBRIDEMENT RIGHT LOWER EXTREMITY INTRAMEDULLARY NAILING RIGHT OPEN TYPE II TIBIA FRACTURE  CV/Blood loss: Acute blood loss anemia, Hgb 12.5 this morning. Hemodynamically stable  PLAN: Weightbearing: NWB RLE  ROM: Okay for knee and hip range of motion Incisional and dressing care: Dressings left intact until follow-up  Showering: Okay to shower, keep splint dry Orthopedic device(s): Splint RLE Pain management:  1. Tylenol 650 mg q 6 hours scheduled 2. Robaxin 500 mg q 6 hours PRN 3. Oxycodone 5 mg q 4 hours PRN 4. Dilaudid 1 mg q 3 hours PRN 5. Neurontin 300 mg 3 times daily 6.  Toradol 15 mg every 6 hours x 5 doses VTE prophylaxis: Lovenox, SCDs ID: Clindamycin postop per open fracture protocol (penicillin allergy) Foley/Lines:  No foley, KVO IVFs Impediments to Fracture Healing: Vitamin D level pending, will start supplementation as indicated Dispo: PT/OT evaluation today.  Okay for discharge from ortho standpoint once cleared by medicine team and therapies  D/C recommendations: -Oxycodone, Robaxin, Neurontin for pain control -Aspirin 325 mg daily x 30 days for DVT prophylaxis -Possible need for Vit D supplementation  Follow - up plan: 2 weeks after discharge for splint/suture removal and repeat x-rays   Contact information:   Truitt Merle MD, Thyra Breed PA-C. After hours and holidays please check [email or web address] for group call information for [facility]   Thompson Caul, PA-C [phone] (office) [email or web address]

## 2023-12-22 NOTE — Plan of Care (Signed)

## 2023-12-22 NOTE — Plan of Care (Signed)
  Problem: Education: Goal: Knowledge of General Education information will improve Description: Including pain rating scale, medication(s)/side effects and non-pharmacologic comfort measures 12/22/2023 0058 by Pamala Duffel, RN Outcome: Progressing 12/22/2023 0056 by Pamala Duffel, RN Outcome: Progressing   Problem: Health Behavior/Discharge Planning: Goal: Ability to manage health-related needs will improve 12/22/2023 0058 by Pamala Duffel, RN Outcome: Progressing 12/22/2023 0056 by Pamala Duffel, RN Outcome: Progressing   Problem: Clinical Measurements: Goal: Ability to maintain clinical measurements within normal limits will improve 12/22/2023 0058 by Pamala Duffel, RN Outcome: Progressing 12/22/2023 0056 by Pamala Duffel, RN Outcome: Progressing Goal: Will remain free from infection 12/22/2023 0058 by Pamala Duffel, RN Outcome: Progressing 12/22/2023 0056 by Pamala Duffel, RN Outcome: Progressing Goal: Diagnostic test results will improve 12/22/2023 0058 by Pamala Duffel, RN Outcome: Progressing 12/22/2023 0056 by Pamala Duffel, RN Outcome: Progressing Goal: Respiratory complications will improve 12/22/2023 0058 by Pamala Duffel, RN Outcome: Progressing 12/22/2023 0056 by Pamala Duffel, RN Outcome: Progressing Goal: Cardiovascular complication will be avoided 12/22/2023 0058 by Pamala Duffel, RN Outcome: Progressing 12/22/2023 0056 by Pamala Duffel, RN Outcome: Progressing   Problem: Activity: Goal: Risk for activity intolerance will decrease 12/22/2023 0058 by Pamala Duffel, RN Outcome: Progressing 12/22/2023 0056 by Pamala Duffel, RN Outcome: Progressing   Problem: Nutrition: Goal: Adequate nutrition will be maintained 12/22/2023 0058 by Pamala Duffel, RN Outcome: Progressing 12/22/2023 0056 by Pamala Duffel, RN Outcome: Progressing   Problem:  Coping: Goal: Level of anxiety will decrease 12/22/2023 0058 by Pamala Duffel, RN Outcome: Progressing 12/22/2023 0056 by Pamala Duffel, RN Outcome: Progressing   Problem: Elimination: Goal: Will not experience complications related to bowel motility 12/22/2023 0058 by Pamala Duffel, RN Outcome: Progressing 12/22/2023 0056 by Pamala Duffel, RN Outcome: Progressing Goal: Will not experience complications related to urinary retention 12/22/2023 0058 by Pamala Duffel, RN Outcome: Progressing 12/22/2023 0056 by Pamala Duffel, RN Outcome: Progressing   Problem: Pain Managment: Goal: General experience of comfort will improve and/or be controlled 12/22/2023 0058 by Pamala Duffel, RN Outcome: Progressing 12/22/2023 0056 by Pamala Duffel, RN Outcome: Progressing   Problem: Safety: Goal: Ability to remain free from injury will improve 12/22/2023 0058 by Pamala Duffel, RN Outcome: Progressing 12/22/2023 0056 by Pamala Duffel, RN Outcome: Progressing   Problem: Skin Integrity: Goal: Risk for impaired skin integrity will decrease 12/22/2023 0058 by Pamala Duffel, RN Outcome: Progressing 12/22/2023 0056 by Pamala Duffel, RN Outcome: Progressing

## 2023-12-22 NOTE — Progress Notes (Signed)
 [facility] PROGRESS NOTE    Progress Note  Annette Lucas  ZOX:[id number] DOB: 02-21-1971 DOA: 12/20/2023 PCP: Lianne Moris, PA-C     Brief Narrative:   Annette Lucas is an 53 y.o. female past medical history of peripheral neuropathy on gabapentin comes into the ER after a fall at home in the bathroom and twisted her ankle called EMS due to significant pain and bleeding   Assessment/Plan:   Right ankle fracture due to mechanical fall: Surgery was consulted was placed n.p.o. for surgical intervention on 12/21/2023. PT OT evaluation is pending. Analgesics and anticoagulation per orthopedic surgery.  Leukocytosis: Likely reactive she remains afebrile.  Peripheral neuropathy Continue gabapentin.  Chronic anxiety Continue BuSpar.  DVT prophylaxis: lovenox Family Communication:none Status is: Observation The patient remains OBS appropriate and will d/c before 2 midnights.    Code Status:     Code Status Orders  (From admission, onward)           Start     Ordered   12/20/23 2045  Full code  Continuous       Question:  By:  Answer:  Consent: discussion documented in EHR   12/20/23 2044           Code Status History     Date Active Date Inactive Code Status Order ID Comments User Context   01/15/2020 2158 01/19/2020 1716 Full Code [id number]  Onnie Boer, MD Inpatient         IV Access:   Peripheral IV   Procedures and diagnostic studies:   DG Tibia/Fibula Right Port Result Date: 12/21/2023 CLINICAL DATA:  96051 Fracture 96051 EXAM: PORTABLE RIGHT TIBIA AND FIBULA - 2 VIEW COMPARISON:  X-ray intraoperative right tib fib fracture 12/21/2023 9:11 p.m. X-ray right ankle 12/20/2023 FINDINGS: Status post intramedullary nail fixation of the right tibia shaft fracture. Acute comminuted and displaced distal fibular fracture. No ankle or knee dislocation. Overlying cast. Subcutaneus soft tissue edema. IMPRESSION: 1. Status post intramedullary  nail fixation of the right tibia shaft fracture. 2. Acute comminuted and displaced distal fibular fracture. Electronically Signed   By: Tish Frederickson M.D.   On: 12/21/2023 21:17   DG Tibia/Fibula Right Result Date: 12/21/2023 CLINICAL DATA:  Elective surgery. EXAM: RIGHT TIBIA AND FIBULA - 2 VIEW COMPARISON:  12/20/2023. FINDINGS: Five fluoroscopic images were obtained intraoperatively. Total fluoroscopy time is 60 seconds. Dose: 0.93 mGy. There is redemonstration of comminuted fractures of the distal tibia and fibula with interval placement of fixation hardware. Please see operative report for additional information. IMPRESSION: Intraoperative utilization of fluoroscopy. Electronically Signed   By: Thornell Sartorius M.D.   On: 12/21/2023 21:11   DG C-Arm 1-60 Min-No Report Result Date: 12/21/2023 Fluoroscopy was utilized by the requesting physician.  No radiographic interpretation.   DG Ankle Complete Right Result Date: 12/20/2023 CLINICAL DATA:  Status post reduction of right ankle fractures. EXAM: RIGHT ANKLE - COMPLETE 3+ VIEW COMPARISON:  Same day. FINDINGS: The right ankle has been casted and immobilized. There is continued presence of moderately angulated and comminuted fractures involving the distal right tibia and fibula. IMPRESSION: Moderately angulated and comminuted distal right tibial and fibular fractures. Electronically Signed   By: Lupita Raider M.D.   On: 12/20/2023 20:27   DG Ankle Right Port Result Date: 12/20/2023 CLINICAL DATA:  Right ankle laceration after injury. EXAM: PORTABLE RIGHT ANKLE - 2 VIEW COMPARISON:  None Available. FINDINGS: Only a single lateral image was obtained. This demonstrates severely displaced fractures involving the  distal right tibia and fibula. IMPRESSION: Severely displaced distal right tibial and fibular fractures. Electronically Signed   By: Lupita Raider M.D.   On: 12/20/2023 19:10     Medical Consultants:   None.   Subjective:    Annette Lucas she relates her pain is controlled has not had a bowel movement.  Objective:    Vitals:   12/21/23 1807 12/21/23 2056 12/22/23 0436 12/22/23 0745  BP: 131/70 115/64 (!) 91/59 106/68  Pulse: 80 90 71 72  Resp: 18 17 15 17   Temp: 98.5 F (36.9 C) 98.4 F (36.9 C) 97.6 F (36.4 C) 97.9 F (36.6 C)  TempSrc: Oral   Oral  SpO2: 96% 95% 93% 93%  Weight:      Height:       SpO2: 93 %   Intake/Output Summary (Last 24 hours) at 12/22/2023 0907 Last data filed at 12/22/2023 0149 Gross per 24 hour  Intake 250 ml  Output 2930 ml  Net -2680 ml   Filed Weights   12/20/23 1832  Weight: 61.2 kg    Exam: General exam: In no acute distress. Respiratory system: Good air movement and clear to auscultation. Cardiovascular system: S1 & S2 heard, RRR. No JVD. Gastrointestinal system: Abdomen is nondistended, soft and nontender.  Extremities: No pedal edema. Skin: No rashes, lesions or ulcers Psychiatry: Judgement and insight appear normal. Mood & affect appropriate. Data Reviewed:    Labs: Basic Metabolic Panel: Recent Labs  Lab 12/20/23 1933 12/21/23 0620 12/21/23 1830 12/22/23 0604  NA 145 143  --  139  K 3.1* 3.7  --  4.0  CL 113* 108  --  100  CO2 22 26  --  29  GLUCOSE 87 104*  --  157*  BUN <5* 5*  --  17  CREATININE 0.44 0.64 0.77 0.98  CALCIUM 8.2* 8.5*  --  8.8*  MG  --  1.6*  --   --   PHOS  --  3.3  --   --    GFR Estimated Creatinine Clearance: 58.6 mL/min (by C-G formula based on SCr of 0.98 mg/dL). Liver Function Tests: No results for input(s): "AST", "ALT", "ALKPHOS", "BILITOT", "PROT", "ALBUMIN" in the last 168 hours. No results for input(s): "LIPASE", "AMYLASE" in the last 168 hours. No results for input(s): "AMMONIA" in the last 168 hours. Coagulation profile Recent Labs  Lab 12/20/23 1933  INR 1.0   COVID-19 Labs  No results for input(s): "DDIMER", "FERRITIN", "LDH", "CRP" in the last 72 hours.  Lab Results  Component Value Date    SARSCOV2NAA NEGATIVE 09/13/2022   SARSCOV2NAA NEGATIVE 01/15/2020    CBC: Recent Labs  Lab 12/20/23 1933 12/21/23 0620 12/21/23 1830 12/22/23 0604  WBC 9.7 10.6* 12.0* 14.0*  NEUTROABS 7.6  --   --   --   HGB 12.7 12.5 13.4 12.5  HCT 36.9 37.1 39.6 37.2  MCV 97.9 100.3* 100.3* 99.5  PLT 227 241 243 260   Cardiac Enzymes: No results for input(s): "CKTOTAL", "CKMB", "CKMBINDEX", "TROPONINI" in the last 168 hours. BNP (last 3 results) No results for input(s): "PROBNP" in the last 8760 hours. CBG: No results for input(s): "GLUCAP" in the last 168 hours. D-Dimer: No results for input(s): "DDIMER" in the last 72 hours. Hgb A1c: No results for input(s): "HGBA1C" in the last 72 hours. Lipid Profile: No results for input(s): "CHOL", "HDL", "LDLCALC", "TRIG", "CHOLHDL", "LDLDIRECT" in the last 72 hours. Thyroid function studies: No results for input(s): "TSH", "  T4TOTAL", "T3FREE", "THYROIDAB" in the last 72 hours.  Invalid input(s): "FREET3" Anemia work up: No results for input(s): "VITAMINB12", "FOLATE", "FERRITIN", "TIBC", "IRON", "RETICCTPCT" in the last 72 hours. Sepsis Labs: Recent Labs  Lab 12/20/23 1933 12/21/23 0620 12/21/23 1830 12/22/23 0604  WBC 9.7 10.6* 12.0* 14.0*   Microbiology Recent Results (from the past 240 hours)  Surgical pcr screen     Status: None   Collection Time: 12/21/23  5:28 AM   Specimen: Nasal Mucosa; Nasal Swab  Result Value Ref Range Status   MRSA, PCR NEGATIVE NEGATIVE Final   Staphylococcus aureus NEGATIVE NEGATIVE Final    Comment: (NOTE) The Xpert SA Assay (FDA approved for NASAL specimens in patients 48 years of age and older), is one component of a comprehensive surveillance program. It is not intended to diagnose infection nor to guide or monitor treatment. Performed at [facility], [address] [place], [place] [address]      Medications:    acetaminophen  650 mg Oral Q6H   busPIRone  5 mg Oral BID   docusate  sodium  100 mg Oral BID   enoxaparin (LOVENOX) injection  40 mg Subcutaneous Q24H   folic acid  1 mg Oral Daily   gabapentin  300 mg Oral TID   ketorolac  15 mg Intravenous Q6H   methocarbamol  500 mg Oral QID   Or   methocarbamol (ROBAXIN) injection  500 mg Intravenous QID   polyethylene glycol  17 g Oral BID   potassium chloride  10 mEq Oral Daily   torsemide  20 mg Oral BID   Continuous Infusions:  clindamycin (CLEOCIN) IV 900 mg (12/22/23 0314)      LOS: 1 day   Marinda Elk  [facility]  12/22/2023, 9:07 AM

## 2023-12-22 NOTE — Anesthesia Postprocedure Evaluation (Signed)
 Anesthesia Post Note  Patient: Annette Lucas  Procedure(s) Performed: IRRIGATION AND DEBRIDEMENT WOUND (Right: Ankle) EXTERNAL FIXATION, LOWER EXTREMITY (Right: Ankle)     Patient location during evaluation: PACU Anesthesia Type: General Level of consciousness: awake and alert Pain management: pain level controlled Vital Signs Assessment: post-procedure vital signs reviewed and stable Respiratory status: spontaneous breathing, nonlabored ventilation, respiratory function stable and patient connected to nasal cannula oxygen Cardiovascular status: blood pressure returned to baseline and stable Postop Assessment: no apparent nausea or vomiting Anesthetic complications: no   No notable events documented.            Mariann Barter

## 2023-12-22 NOTE — Discharge Instructions (Signed)
 Truitt Merle, MD Thyra Breed PA-C [facility] [address] [phone] Jani Files)   [phone] (fax)                                  POST-OPERATIVE INSTRUCTIONS     WEIGHT BEARING STATUS: Non-weightbearing right lower extremity  RANGE OF MOTION/ACTIVITY:  ok for knee and hip motion. Keep splint in place  WOUND CARE Please keep splint clean dry and intact until follow-up. If your splint gets wet for any reason please contact the office immediately.  Do not stick anything down your splint such as pencils, momey, hangers to try and scratch yourself.  If you feel itchy take Benadryl as prescribed on the bottle for itching You may shower on Post-Op Day #2.  You must keep splint dry during this process and may find that a plastic bag taped around the extremity or alternatively a towel based bath may be a better option.   If you get your splint wet or if it is damaged please contact our clinic.  EXERCISES Due to your splint being in place you will not be able to bear weight through your extremity.   DO NOT PUT ANY WEIGHT ON YOUR OPERATIVE LEG Please use crutches or a Janeway to avoid weight bearing.   DVT/PE prophylaxis: Aspirin  DIET: As you were eating previously.  Can use over the counter stool softeners and bowel preparations, such as Miralax, to help with bowel movements.  Narcotics can be constipating.  Be sure to drink plenty of fluids  REGIONAL ANESTHESIA (NERVE BLOCKS) The anesthesia team may have performed a nerve block for you if safe in the setting of your care.  This is a great tool used to minimize pain.  Typically the block may start wearing off overnight but the long acting medicine may last for 3-4 days.  The nerve block wearing off can be a challenging period but please utilize your as needed pain medications to try and manage this period.    POST-OP MEDICATIONS- Multimodal approach to pain control  In general your pain will be controlled  with a combination of substances.  Prescriptions unless otherwise discussed are electronically sent to your pharmacy.  This is a carefully made plan we use to minimize narcotic use.     - Acetaminophen - Non-narcotic pain medicine taken on a scheduled basis   - Oxycodone - This is a strong narcotic, to be used only on an "as needed" basis for pain.  -  Aspirin 325mg  - This medicine is used to minimize the risk of blood clots after surgery.  FOLLOW-UP If you develop a Fever (>101.5), Redness or Drainage from the surgical incision site, please call our office to arrange for an evaluation. Please call the office to schedule a follow-up appointment for your incision check if you do not already have one, 7-10 days post-operatively.   VISIT OUR WEBSITE FOR ADDITIONAL INFORMATION: [email or web address]   HELPFUL INFORMATION  If you had a block, it will wear off between 8-24 hrs postop typically.  This is period when your pain may go from nearly zero to the pain you would have had postop without the block.  This is an abrupt transition but nothing dangerous is happening.  You may take an extra dose of narcotic when this happens.  You should wean off your narcotic medicines as soon as you are able.  Most patients will be  off or using minimal narcotics before their first postop appointment.   We suggest you use the pain medication the first night prior to going to bed, in order to ease any pain when the anesthesia wears off. You should avoid taking pain medications on an empty stomach as it will make you nauseous.  Do not drink alcoholic beverages or take illicit drugs when taking pain medications.  In most states it is against the law to drive while you are in a splint or sling.  And certainly against the law to drive while taking narcotics.  You may return to work/school in the next couple of days when you feel up to it.   Pain medication may make you constipated.  Below are a few solutions to try in  this order: Decrease the amount of pain medication if you aren't having pain. Drink lots of decaffeinated fluids. Drink prune juice and/or each dried prunes  If the first 3 don't work start with additional solutions Take Colace - an over-the-counter stool softener Take Senokot - an over-the-counter laxative Take Miralax - a stronger over-the-counter laxative

## 2023-12-23 DIAGNOSIS — S82251A Displaced comminuted fracture of shaft of right tibia, initial encounter for closed fracture: Secondary | ICD-10-CM | POA: Diagnosis not present

## 2023-12-23 LAB — BASIC METABOLIC PANEL WITH GFR
Anion gap: 7 (ref 5–15)
BUN: 27 mg/dL — ABNORMAL HIGH (ref 6–20)
CO2: 29 mmol/L (ref 22–32)
Calcium: 8.1 mg/dL — ABNORMAL LOW (ref 8.9–10.3)
Chloride: 101 mmol/L (ref 98–111)
Creatinine, Ser: 0.96 mg/dL (ref 0.44–1.00)
GFR, Estimated: >60 mL/min (ref >60)
Glucose, Bld: 103 mg/dL — ABNORMAL HIGH (ref 70–99)
Potassium: 2.9 mmol/L — ABNORMAL LOW (ref 3.5–5.1)
Sodium: 137 mmol/L (ref 135–145)

## 2023-12-23 LAB — CBC
HCT: 34.4 % — ABNORMAL LOW (ref 36.0–46.0)
Hemoglobin: 11.7 g/dL — ABNORMAL LOW (ref 12.0–15.0)
MCH: 33.4 pg (ref 26.0–34.0)
MCHC: 34 g/dL (ref 30.0–36.0)
MCV: 98.3 fL (ref 80.0–100.0)
Platelets: 243 10*3/uL (ref 150–400)
RBC: 3.5 MIL/uL — ABNORMAL LOW (ref 3.87–5.11)
RDW: 15.2 % (ref 11.5–15.5)
WBC: 12.4 10*3/uL — ABNORMAL HIGH (ref 4.0–10.5)
nRBC: 0 % (ref 0.0–0.2)

## 2023-12-23 MED ORDER — POTASSIUM CHLORIDE CRYS ER 20 MEQ PO TBCR
40.0000 meq | EXTENDED_RELEASE_TABLET | Freq: Once | ORAL | Status: AC
  Administered 2023-12-23: 40 meq via ORAL
  Filled 2023-12-23: qty 2

## 2023-12-23 MED ORDER — VITAMIN D 25 MCG (1000 UNIT) PO TABS
1000.0000 [IU] | ORAL_TABLET | Freq: Every day | ORAL | Status: DC
  Administered 2023-12-23 – 2023-12-24 (×2): 1000 [IU] via ORAL
  Filled 2023-12-23 (×2): qty 1

## 2023-12-23 NOTE — Progress Notes (Signed)
 Transition of Care [facility]) - CAGE-AID Screening   Patient Details  Name: Annette Lucas MRN: [id number] Date of Birth: 1971/08/28   [name], RN Trauma Response Nurse Phone Number: [phone] 12/23/2023, 6:19 PM     CAGE-AID Screening:    Have You Ever Felt You Ought to Cut Down on Your Drinking or Drug Use?: No Have People Annoyed You By Critizing Your Drinking Or Drug Use?: No Have You Felt Bad Or Guilty About Your Drinking Or Drug Use?: No Have You Ever Had a Drink or Used Drugs First Thing In The Morning to Steady Your Nerves or to Get Rid of a Hangover?: No CAGE-AID Score: 0  Substance Abuse Education Offered: (S) No (No services needed- drinks etoh occasionally per pt)

## 2023-12-23 NOTE — Progress Notes (Signed)
 Mobility Specialist Progress Note:    12/23/23 1500  Mobility  Activity Ambulated with assistance in room  Level of Assistance Standby assist, set-up cues, supervision of patient - no hands on  Assistive Device Front wheel [place]  Distance Ambulated (ft) 40 ft  RLE Weight Bearing Per Provider Order NWB  Activity Response Tolerated well  Mobility Referral Yes  Mobility visit 1 Mobility  Mobility Specialist Start Time (ACUTE ONLY) 1508  Mobility Specialist Stop Time (ACUTE ONLY) 1517  Mobility Specialist Time Calculation (min) (ACUTE ONLY) 9 min   Pt received in chair and agreeable. Able to stand and ambulate in room w/ standby assist. Took x1 seated rest break. No complaints throughout. Pt left in chair with call bell and all needs met.  D'Vante Earlene Plater Mobility Specialist Please contact via Special educational needs teacher or Rehab office at [phone]

## 2023-12-23 NOTE — Progress Notes (Addendum)
 [facility] PROGRESS NOTE    Progress Note  Annette Lucas  ZOX:[id number] DOB: 10-11-1970 DOA: 12/20/2023 PCP: Lianne Moris, PA-C     Brief Narrative:   Annette Lucas is an 53 y.o. female past medical history of peripheral neuropathy on gabapentin comes into the ER after a fall at home in the bathroom and twisted her ankle called EMS due to significant pain and bleeding   Assessment/Plan:   Right ankle fracture due to mechanical fall: Surgery was consulted post surgical intervention on 12/21/2023. PT OT done, she will need inpatient rehab. Analgesics and anticoagulation per orthopedic surgery. Awaiting inpatient rehab evaluation.  Leukocytosis: Likely reactive she remains afebrile.  Peripheral neuropathy Continue gabapentin.  Chronic anxiety Continue BuSpar.  Acute blood loss anemia: Hemoglobin dropped from 13-11.7. Likely postsurgical, continue to monitor hemoglobin.  Hypokalemia: Replete orally.  DVT prophylaxis: lovenox Family Communication:none Status is: Observation The patient remains OBS appropriate and will d/c before 2 midnights.    Code Status:     Code Status Orders  (From admission, onward)           Start     Ordered   12/20/23 2045  Full code  Continuous       Question:  By:  Answer:  Consent: discussion documented in EHR   12/20/23 2044           Code Status History     Date Active Date Inactive Code Status Order ID Comments User Context   01/15/2020 2158 01/19/2020 1716 Full Code [id number]  Onnie Boer, MD Inpatient         IV Access:   Peripheral IV   Procedures and diagnostic studies:   DG Tibia/Fibula Right Port Result Date: 12/21/2023 CLINICAL DATA:  96051 Fracture 96051 EXAM: PORTABLE RIGHT TIBIA AND FIBULA - 2 VIEW COMPARISON:  X-ray intraoperative right tib fib fracture 12/21/2023 9:11 p.m. X-ray right ankle 12/20/2023 FINDINGS: Status post intramedullary nail fixation of the right tibia shaft  fracture. Acute comminuted and displaced distal fibular fracture. No ankle or knee dislocation. Overlying cast. Subcutaneus soft tissue edema. IMPRESSION: 1. Status post intramedullary nail fixation of the right tibia shaft fracture. 2. Acute comminuted and displaced distal fibular fracture. Electronically Signed   By: Tish Frederickson M.D.   On: 12/21/2023 21:17   DG Tibia/Fibula Right Result Date: 12/21/2023 CLINICAL DATA:  Elective surgery. EXAM: RIGHT TIBIA AND FIBULA - 2 VIEW COMPARISON:  12/20/2023. FINDINGS: Five fluoroscopic images were obtained intraoperatively. Total fluoroscopy time is 60 seconds. Dose: 0.93 mGy. There is redemonstration of comminuted fractures of the distal tibia and fibula with interval placement of fixation hardware. Please see operative report for additional information. IMPRESSION: Intraoperative utilization of fluoroscopy. Electronically Signed   By: Thornell Sartorius M.D.   On: 12/21/2023 21:11   DG C-Arm 1-60 Min-No Report Result Date: 12/21/2023 Fluoroscopy was utilized by the requesting physician.  No radiographic interpretation.     Medical Consultants:   None.   Subjective:    Annette Lucas pain is controlled had a bowel movement.  Objective:    Vitals:   12/22/23 1500 12/22/23 1958 12/23/23 0449 12/23/23 0809  BP: (!) 118/57 (!) 108/57 (!) 96/49 (!) 105/59  Pulse: 81 67 61 61  Resp: 17 17 19 18   Temp: 98.2 F (36.8 C) 97.9 F (36.6 C) 97.7 F (36.5 C) 98.3 F (36.8 C)  TempSrc: Oral Oral Oral Oral  SpO2: 99% 96% 97% 98%  Weight:      Height:  SpO2: 98 %   Intake/Output Summary (Last 24 hours) at 12/23/2023 0832 Last data filed at 12/23/2023 3875 Gross per 24 hour  Intake 1120 ml  Output 1000 ml  Net 120 ml   Filed Weights   12/20/23 1832  Weight: 61.2 kg    Exam: General exam: In no acute distress. Respiratory system: Good air movement and clear to auscultation. Cardiovascular system: S1 & S2 heard, RRR. No  JVD. Gastrointestinal system: Abdomen is nondistended, soft and nontender.  Extremities: No pedal edema. Skin: No rashes, lesions or ulcers Psychiatry: Judgement and insight appear normal. Mood & affect appropriate. Data Reviewed:    Labs: Basic Metabolic Panel: Recent Labs  Lab 12/20/23 1933 12/21/23 0620 12/21/23 1830 12/22/23 0604 12/23/23 0550  NA 145 143  --  139 137  K 3.1* 3.7  --  4.0 2.9*  CL 113* 108  --  100 101  CO2 22 26  --  29 29  GLUCOSE 87 104*  --  157* 103*  BUN <5* 5*  --  17 27*  CREATININE 0.44 0.64 0.77 0.98 0.96  CALCIUM 8.2* 8.5*  --  8.8* 8.1*  MG  --  1.6*  --   --   --   PHOS  --  3.3  --   --   --    GFR Estimated Creatinine Clearance: 59.9 mL/min (by C-G formula based on SCr of 0.96 mg/dL). Liver Function Tests: No results for input(s): "AST", "ALT", "ALKPHOS", "BILITOT", "PROT", "ALBUMIN" in the last 168 hours. No results for input(s): "LIPASE", "AMYLASE" in the last 168 hours. No results for input(s): "AMMONIA" in the last 168 hours. Coagulation profile Recent Labs  Lab 12/20/23 1933  INR 1.0   COVID-19 Labs  No results for input(s): "DDIMER", "FERRITIN", "LDH", "CRP" in the last 72 hours.  Lab Results  Component Value Date   SARSCOV2NAA NEGATIVE 09/13/2022   SARSCOV2NAA NEGATIVE 01/15/2020    CBC: Recent Labs  Lab 12/20/23 1933 12/21/23 0620 12/21/23 1830 12/22/23 0604 12/23/23 0550  WBC 9.7 10.6* 12.0* 14.0* 12.4*  NEUTROABS 7.6  --   --   --   --   HGB 12.7 12.5 13.4 12.5 11.7*  HCT 36.9 37.1 39.6 37.2 34.4*  MCV 97.9 100.3* 100.3* 99.5 98.3  PLT 227 241 243 260 243   Cardiac Enzymes: No results for input(s): "CKTOTAL", "CKMB", "CKMBINDEX", "TROPONINI" in the last 168 hours. BNP (last 3 results) No results for input(s): "PROBNP" in the last 8760 hours. CBG: No results for input(s): "GLUCAP" in the last 168 hours. D-Dimer: No results for input(s): "DDIMER" in the last 72 hours. Hgb A1c: No results for  input(s): "HGBA1C" in the last 72 hours. Lipid Profile: No results for input(s): "CHOL", "HDL", "LDLCALC", "TRIG", "CHOLHDL", "LDLDIRECT" in the last 72 hours. Thyroid function studies: No results for input(s): "TSH", "T4TOTAL", "T3FREE", "THYROIDAB" in the last 72 hours.  Invalid input(s): "FREET3" Anemia work up: No results for input(s): "VITAMINB12", "FOLATE", "FERRITIN", "TIBC", "IRON", "RETICCTPCT" in the last 72 hours. Sepsis Labs: Recent Labs  Lab 12/21/23 0620 12/21/23 1830 12/22/23 0604 12/23/23 0550  WBC 10.6* 12.0* 14.0* 12.4*   Microbiology Recent Results (from the past 240 hours)  Surgical pcr screen     Status: None   Collection Time: 12/21/23  5:28 AM   Specimen: Nasal Mucosa; Nasal Swab  Result Value Ref Range Status   MRSA, PCR NEGATIVE NEGATIVE Final   Staphylococcus aureus NEGATIVE NEGATIVE Final    Comment: (NOTE) The  Xpert SA Assay (FDA approved for NASAL specimens in patients 56 years of age and older), is one component of a comprehensive surveillance program. It is not intended to diagnose infection nor to guide or monitor treatment. Performed at [facility], [address] [place], [place] [address]      Medications:    acetaminophen  650 mg Oral Q6H   busPIRone  5 mg Oral BID   docusate sodium  100 mg Oral BID   enoxaparin (LOVENOX) injection  40 mg Subcutaneous Q24H   folic acid  1 mg Oral Daily   gabapentin  300 mg Oral TID   ketorolac  15 mg Intravenous Q6H   methocarbamol  500 mg Oral QID   Or   methocarbamol (ROBAXIN) injection  500 mg Intravenous QID   polyethylene glycol  17 g Oral BID   potassium chloride  10 mEq Oral Daily   torsemide  20 mg Oral BID   Continuous Infusions:      LOS: 2 days   Marinda Elk  [facility]  12/23/2023, 8:32 AM

## 2023-12-23 NOTE — Progress Notes (Signed)
 Physical Medicine & Rehabilitation Consult Service  Pt discussed with rehab admissions coordinator. Chart has been reviewed. 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 Pt's course complicated by post-op pain, persistent leukocytosis, significant hypokalemia. Vitamin D levels low also. She is NWB RLE. Pt is currently min assist for transfers and pre-gait activities. Was independent PTA     Assessment: Right tibia fx s/p I&D  and IMN Hx of peripheral neuropathy   Plan:  This patient would benefit from acute inpatient rehab to address functional mobility and self-care in the setting of her weight bearing precautions, . Additionally, the patient requires daily MD oversight of the active medical issues noted above. Projected goals would be mod I with mobility and self-care. Dispo and social supports are appropriate as family can provide intermittent assist at home.    Rehab Admissions Coordinator to follow up.    Ranelle Oyster, MD, [facility] Physical Medicine & Rehabilitation Medical Director Rehabilitation Services 12/23/2023

## 2023-12-23 NOTE — PMR Pre-admission (Signed)
 PMR Admission Coordinator Pre-Admission Assessment  Patient: Annette Lucas is an 53 y.o., female MRN: [id number] DOB: [birth year] Height: 5\' 4"  (162.6 cm) Weight: 61.2 kg  Insurance Information HMO:     PPO:      PCP:      IPA:      80/20:      OTHER:  PRIMARY: [place] Medicaid [employer] [place]      Policy#: [id number]; Medicaid # [id number] S      Subscriber: pt CM Name: via fax      Phone#: [phone]     Fax#: [phone] Pre-Cert#: [id number] approved 3/29 until 12/31/23     Employer:  Benefits:  Phone #: [phone]     Name: 3/28 Eff. Date: 01/26/2023 until 01/24/2025     Deduct: none      Out of Pocket Max: none      Life Max: none CIR: Per medicaid guidelines      SNF: per medicaid guidelines Outpatient: per medicaid guidelines    Co-Pay:  Home Health: per medicaid guidelines      Co-Pay:  DME: per medicaid guidelines     Co-Pay:  Providers: in network  SECONDARY: none      Policy#:      Phone#:   Artist:       Phone#:   The Data processing manager" for patients in Inpatient Rehabilitation Facilities with attached "Privacy Act Statement-Health Care Records" was provided and verbally reviewed with: Patient  Emergency Contact Information Contact Information     Name Relation Home Work Mobile   Shockley,Joyce Mother   [phone]      Other Contacts   None on File    Current Medical History  Patient Admitting Diagnosis: Right Tibia Fracture  History of Present Illness: 53 yo female with history for peripheral neuropathy on gabapentin, GERD who presented on 12/20/23 to [facility] after a fall form home. She slipped in the bathroom and hitting the door frame.   Imaging revealed right open distal tibia and fibula fracture. Ortho consulted. Underwent I and D of right open tibia fracture and IM nailing of right tibia fracture on 12/21/23. Postoperative management for pain management. ASA for 30 days for DVT prophylaxis. Vit D supplementation.    Patient's medical record from [facility] has been reviewed by the rehabilitation admission coordinator and physician.  Past Medical History  Past Medical History:  Diagnosis Date   Allergy to meat    GERD (gastroesophageal reflux disease)    Hypertension    IBS (irritable bowel syndrome)    Neuropathy    Stroke [facility])    Has the patient had major surgery during 100 days prior to admission? Yes  Family History   family history is not on file.  Current Medications  Current Facility-Administered Medications:    acetaminophen (TYLENOL) tablet 650 mg, 650 mg, Oral, Q6H, McClung, Sarah A, PA-C, 650 mg at 12/23/23 1241   busPIRone (BUSPAR) tablet 5 mg, 5 mg, Oral, BID, West Bali, PA-C, 5 mg at 12/23/23 0630   cholecalciferol (VITAMIN D3) 25 MCG (1000 UNIT) tablet 1,000 Units, 1,000 Units, Oral, Daily, [place] Bali, PA-C, 1,000 Units at 12/23/23 1241   docusate sodium (COLACE) capsule 100 mg, 100 mg, Oral, BID, Thyra Breed A, PA-C, 100 mg at 12/23/23 0924   enoxaparin (LOVENOX) injection 40 mg, 40 mg, Subcutaneous, Q24H, West Bali, PA-C, 40 mg at 12/23/23 1601   folic acid (FOLVITE) tablet 1 mg, 1 mg, Oral, Daily,  West Bali, PA-C, 1 mg at 12/23/23 1610   gabapentin (NEURONTIN) capsule 300 mg, 300 mg, Oral, TID, West Bali, PA-C, 300 mg at 12/23/23 1633   HYDROmorphone (DILAUDID) injection 1 mg, 1 mg, Intravenous, Q3H PRN, West Bali, PA-C, 1 mg at 12/21/23 9604   hydrOXYzine (ATARAX) tablet 10 mg, 10 mg, Oral, TID PRN, Marinda Elk, MD, 10 mg at 12/21/23 2129   melatonin tablet 5 mg, 5 mg, Oral, QHS PRN, West Bali, PA-C   methocarbamol (ROBAXIN) tablet 500 mg, 500 mg, Oral, QID, 500 mg at 12/23/23 1633 **OR** methocarbamol (ROBAXIN) injection 500 mg, 500 mg, Intravenous, QID, McClung, Sarah A, PA-C   oxyCODONE (Oxy IR/ROXICODONE) immediate release tablet 5 mg, 5 mg, Oral, Q4H PRN, McClung, Sarah A, PA-C, 5 mg at 12/22/23 1020    polyethylene glycol (MIRALAX / GLYCOLAX) packet 17 g, 17 g, Oral, Daily PRN, West Bali, PA-C, 17 g at 12/23/23 5409   potassium chloride (KLOR-CON) CR tablet 10 mEq, 10 mEq, Oral, Daily, West Bali, PA-C, 10 mEq at 12/23/23 8119   prochlorperazine (COMPAZINE) injection 5 mg, 5 mg, Intravenous, Q6H PRN, West Bali, PA-C   torsemide [facility]) tablet 20 mg, 20 mg, Oral, BID, West Bali, PA-C, 20 mg at 12/23/23 1633  Patients Current Diet:  Diet Order             Diet regular Room service appropriate? Yes; Fluid consistency: Thin  Diet effective now                  Precautions / Restrictions Precautions Precautions: Fall Restrictions Weight Bearing Restrictions Per Provider Order: Yes RLE Weight Bearing Per Provider Order: Non weight bearing   Has the patient had 2 or more falls or a fall with injury in the past year? Yes  Prior Activity Level Community (5-7x/wk): Independent without AD, does not drive, works remote form home  Prior Functional Level Self Care: Did the patient need help bathing, dressing, using the toilet or eating? Independent  Indoor Mobility: Did the patient need assistance with walking from room to room (with or without device)? Independent  Stairs: Did the patient need assistance with internal or external stairs (with or without device)? Independent  Functional Cognition: Did the patient need help planning regular tasks such as shopping or remembering to take medications? Independent  Patient Information Are you of Hispanic, Latino/a,or Spanish origin?: A. No, not of Hispanic, Latino/a, or Spanish origin What is your race?: A. White Do you need or want an interpreter to communicate with a doctor or health care staff?: 0. No  Patient's Response To:  Health Literacy and Transportation Is the patient able to respond to health literacy and transportation needs?: Yes Health Literacy - How often do you need to have someone help you  when you read instructions, pamphlets, or other written material from your doctor or pharmacy?: Never In the past 12 months, has lack of transportation kept you from medical appointments or from getting medications?: No In the past 12 months, has lack of transportation kept you from meetings, work, or from getting things needed for daily living?: No  Home Assistive Devices / Equipment Home Equipment: Agricultural consultant (2 wheels), Rollator (4 wheels), BSC/3in1, Shower seat, Grab bars - toilet, Grab bars - tub/shower, Hand held shower head  Prior Device Use: Indicate devices/aids used by the patient prior to current illness, exacerbation or injury? 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 not fully tested at foot due to splint/NWB. pt reports chronically decreased senation in foot, is able to move toes. RLE: Unable to fully assess due to pain, Unable to fully assess due to immobilization RLE Sensation: decreased light touch, history of peripheral neuropathy LLE Deficits / Details: grossly functional, hx of peripheral neuropathy LLE Sensation: history of peripheral neuropathy    ADLs  Overall ADL's : Needs assistance/impaired Eating/Feeding: Set up, Sitting Grooming: Set up, Sitting Upper Body Dressing : Set up, Sitting Lower Body Dressing: Moderate assistance, Sit to/from  stand, Cueing for safety, Cueing for sequencing Lower Body Dressing Details (indicate cue type and reason): Pt able to cross legs to don socks, would require assitance for pants Toilet Transfer: Contact guard assist, Stand-pivot, Cueing for safety, BSC/3in1, Rolling Rubinstein (2 wheels) Toilet Transfer Details (indicate cue type and reason): SPT to BSC, good progress maintaining WB status Toileting- Clothing Manipulation and Hygiene: Total assistance, Sit to/from stand Toileting - Clothing Manipulation Details (indicate cue type and reason): Pt unable to maintain standing balance to wipe Functional mobility during ADLs: Contact guard assist, Cueing for safety, Rolling Lasch (2 wheels) General ADL Comments: Poor safety/judgement    Mobility  Overal bed mobility: Needs Assistance Bed Mobility: Supine to Sit Supine to sit: Min assist, Used rails, HOB elevated General bed mobility comments: Received in recliner    Transfers  Overall transfer level: Needs assistance Equipment used: Rolling Lafond (2 wheels) Transfers: Bed to chair/wheelchair/BSC Sit to Stand: Min assist, From elevated surface Bed to/from chair/wheelchair/BSC transfer type:: Stand pivot Stand pivot transfers: Contact guard assist Step pivot transfers: Min assist, +2 safety/equipment General transfer comment: Cueing for safety    Ambulation / Gait / Stairs / Wheelchair Mobility  Ambulation/Gait Ambulation/Gait assistance: Min assist, +2 safety/equipment Gait Distance (Feet): 20 Feet (+ 35 ft) Assistive device: Rolling Hesler (2 wheels) General Gait Details: hop-to pattern with increased forwards swing at times and intermittent minA to direct and steady. chair follow for seated rest Gait velocity: decreased Gait velocity interpretation: <1.31 ft/sec, indicative of household ambulator Pre-gait activities: small pivotal steps with RW, minA of 2.    Posture / Balance Balance Overall balance assessment: Needs  assistance Sitting-balance support: No upper extremity supported, Feet supported Sitting balance-Leahy Scale: Fair Standing balance support: Bilateral upper extremity supported, During functional activity, Reliant on assistive device for balance Standing balance-Leahy Scale: Poor Standing balance comment: Reliant on RW    Special needs/care consideration    Previous Home Environment  Living Arrangements: Alone  Lives With: Alone Available Help at Discharge:  (Mom, friends intermittently) Type of Home: House Home Layout: One level Home Access: Stairs to enter Entrance Stairs-Rails: Right Entrance Stairs-Number of Steps: 2 Bathroom Shower/Tub: Health visitor: Standard Bathroom Accessibility: Yes How Accessible: Accessible via [place] Home Care Services: No (Just completed OP therapy) Additional Comments: Not driving friends/family to drive  Discharge Living Setting Plans for Discharge Living Setting: Patient's home, Alone, House Type of Home at Discharge:  House Discharge Home Layout: One level Discharge Home Access: Stairs to enter Entrance Stairs-Rails: Right Entrance Stairs-Number of Steps: 2 Discharge Bathroom Shower/Tub: Walk-in shower Discharge Bathroom Toilet: Standard Discharge Bathroom Accessibility: Yes How Accessible: Accessible via [place] Does the patient have any problems obtaining your medications?: No  Social/Family/Support Systems Patient Roles:  (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) Contact Information: Mom, Printmaker Anticipated Caregiver: Mom and friends intermittently Anticipated Industrial/product designer Information: see contacts Ability/Limitations of Caregiver: prn assist Caregiver Availability: Intermittent Discharge Plan Discussed with Primary Caregiver: Yes Is Caregiver In Agreement with Plan?: Yes Does Caregiver/Family have Issues with Lodging/Transportation while Pt is in Rehab?: No  Goals Patient/Family Goal for Rehab: Mod I to  intermittent supervision with PT and OT Expected length of stay: ELOS 7 to 10 days Pt/Family Agrees to Admission and willing to participate: Yes Program Orientation Provided & Reviewed with Pt/Caregiver Including Roles  & Responsibilities: Yes  Decrease burden of Care through IP rehab admission: n/a  Possible need for SNF placement upon discharge: not anticipated  Patient Condition: I have reviewed medical records from [facility],  met with patient at bedside for inpatient rehabilitation assessment.  Patient will benefit from ongoing PT and OT, can actively participate in 3 hours of therapy a day 5 days of the week, and can make measurable gains during the admission.  Patient will also benefit from the coordinated team approach during an Inpatient Acute Rehabilitation admission.  The patient will receive intensive therapy as well as Rehabilitation physician, nursing, social worker, and care management interventions.  Due to bladder management, bowel management, safety, skin/wound care, disease management, medication administration, pain management, and patient education the patient requires 24 hour a day rehabilitation nursing.  The patient is currently min assist overall with mobility and basic ADLs.  Discharge setting and therapy post discharge at home with home health is anticipated.  Patient has agreed to participate in the Acute Inpatient Rehabilitation Program and will admit 12/24/23.  Preadmission Screen Completed By:  Clois Dupes, RN MSN 12/23/2023 4:58 PM ______________________________________________________________________   Discussed status with Dr. Shearon Stalls on 12/23/23 at 1500 and received approval for admission 12/24/23  Admission Coordinator:  Clois Dupes, RN MSN time 1500 Date 12/23/23   Assessment/Plan: Diagnosis: Right tibia fx s/p I&D and IMN  Does the need for close, 24 hr/day Medical supervision in concert with the patient's rehab needs make it  unreasonable for this patient to be served in a less intensive setting? Yes Co-Morbidities requiring supervision/potential complications: post-op pain,  leukocytosis,  hypokalemia, anemia, peripheral neuropathy, and orthostatic hypotension Due to bladder management, bowel management, safety, skin/wound care, disease management, medication administration, pain management, and patient education, does the patient require 24 hr/day rehab nursing? Yes Does the patient require coordinated care of a physician, rehab nurse, PT, OT to address physical and functional deficits in the context of the above medical diagnosis(es)? Yes Addressing deficits in the following areas: balance, endurance, locomotion, strength, transferring, bathing, dressing, feeding, grooming, and toileting Can the patient actively participate in an intensive therapy program of at least 3 hrs of therapy 5 days a week? Yes The potential for patient to make measurable gains while on inpatient rehab is good Anticipated functional outcomes upon discharge from inpatient rehab: modified independent PT, modified independent OT, Estimated rehab length of stay to reach the above functional goals is: 7-10 days Anticipated discharge destination: Home 10. Overall Rehab/Functional Prognosis: good   MD Signature:  Angelina Sheriff, DO 12/24/2023

## 2023-12-23 NOTE — Progress Notes (Signed)
 Occupational Therapy Treatment Patient Details Name: Annette Lucas MRN: [id number] DOB: 12-23-1970 Today's Date: 12/23/2023   History of present illness Pt is a 53 y.o. female admitted 12/20/23 with R ankle laceration and pain after slip in home. X-ray showed distal tibial shaft fx. S/p R I & D and Intramedullary nailing of right tibia fracture performed 3/26. NWB. PMH: neuropathy, colitis   OT comments  Pt progressing well towards goals. Pt showed improvement with functional transfers and better safety awareness during transfers. Pt continues to be limited by decreased balance and strength. Continue to recommend >3 hours of skilled rehab daily to optimize independence levels. Will continue to follow acutely.       If plan is discharge home, recommend the following:  A little help with walking and/or transfers;A lot of help with bathing/dressing/bathroom;Supervision due to cognitive status;Assist for transportation   Equipment Recommendations  None recommended by OT (All DME needs met)    Recommendations for Other Services      Precautions / Restrictions Precautions Precautions: Fall Recall of Precautions/Restrictions: Intact Required Braces or Orthoses: Splint/Cast Splint/Cast: RLE Restrictions Weight Bearing Restrictions Per Provider Order: Yes RLE Weight Bearing Per Provider Order: Non weight bearing       Mobility Bed Mobility Overal bed mobility: Needs Assistance             General bed mobility comments: Received in recliner    Transfers Overall transfer level: Needs assistance Equipment used: Rolling Fussell (2 wheels) Transfers: Bed to chair/wheelchair/BSC   Stand pivot transfers: Contact guard assist         General transfer comment: Cueing for safety     Balance Overall balance assessment: Needs assistance Sitting-balance support: No upper extremity supported, Feet supported Sitting balance-Leahy Scale: Fair     Standing balance support: Bilateral  upper extremity supported, During functional activity, Reliant on assistive device for balance Standing balance-Leahy Scale: Poor Standing balance comment: Reliant on RW                           ADL either performed or assessed with clinical judgement   ADL Overall ADL's : Needs assistance/impaired           Toilet Transfer: Contact guard assist;Stand-pivot;Cueing for safety;BSC/3in1;Rolling Rewis (2 wheels) Toilet Transfer Details (indicate cue type and reason): SPT to BSC, good progress maintaining WB status Toileting- Clothing Manipulation and Hygiene: Total assistance;Sit to/from stand Toileting - Clothing Manipulation Details (indicate cue type and reason): Pt unable to maintain standing balance to wipe     Functional mobility during ADLs: Contact guard assist;Cueing for safety;Rolling Maggi (2 wheels)      Extremity/Trunk Assessment Upper Extremity Assessment Upper Extremity Assessment: Overall WFL for tasks assessed;RUE deficits/detail;LUE deficits/detail RUE Deficits / Details: 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 RUE Sensation: decreased light touch RUE Coordination: decreased fine motor LUE Deficits / Details: 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 LUE Sensation: decreased light touch LUE Coordination: decreased fine motor   Lower Extremity Assessment Lower Extremity Assessment: Defer to PT evaluation        Vision   Vision Assessment?: No apparent visual deficits   Perception     Praxis     Communication Communication Communication: No apparent difficulties   Cognition Arousal: Alert Behavior During Therapy: Impulsive Cognition: Cognition impaired     Awareness: Intellectual awareness intact, Online awareness impaired   Attention impairment (select first level of impairment): Selective attention Executive functioning impairment (select all impairments): Problem  solving OT - Cognition Comments: Pt impulsive, poor safety awareness        Following commands: Intact        Cueing   Cueing Techniques: Verbal cues        General Comments VSS on RA    Pertinent Vitals/ Pain       Pain Assessment Pain Assessment: Faces Faces Pain Scale: Hurts little more Pain Location: R ankle/knee Pain Descriptors / Indicators: Aching, Sore Pain Intervention(s): Monitored during session, Repositioned  Home Living     Available Help at Discharge:  (Mom, friends intermittently)           Bathroom Accessibility: Yes          Lives With: Alone        Frequency  Min 2X/week        Progress Toward Goals  OT Goals(current goals can now be found in the care plan section)  Progress towards OT goals: Progressing toward goals  Acute Rehab OT Goals Patient Stated Goal: To be independent OT Goal Formulation: With patient Time For Goal Achievement: 01/05/24 Potential to Achieve Goals: Good ADL Goals Pt Will Perform Lower Body Dressing: with set-up;sitting/lateral leans Pt Will Transfer to Toilet: with supervision;stand pivot transfer;bedside commode Pt Will Perform Toileting - Clothing Manipulation and hygiene: with supervision;sitting/lateral leans;sit to/from stand  Plan      Co-evaluation                 AM-PAC OT "6 Clicks" Daily Activity     Outcome Measure   Help from another person eating meals?: None Help from another person taking care of personal grooming?: A Little Help from another person toileting, which includes using toliet, bedpan, or urinal?: Total Help from another person bathing (including washing, rinsing, drying)?: A Lot Help from another person to put on and taking off regular upper body clothing?: A Little Help from another person to put on and taking off regular lower body clothing?: A Lot 6 Click Score: 15    End of Session Equipment Utilized During Treatment: Gait belt;Rolling Scalf (2 wheels)  OT Visit Diagnosis: Unsteadiness on feet (R26.81);Other abnormalities of gait and  mobility (R26.89);Muscle weakness (generalized) (M62.81)   Activity Tolerance Patient tolerated treatment well   Patient Left in chair;with call bell/phone within reach;with chair alarm set   Nurse Communication Mobility status        Time: 1213-1226 OT Time Calculation (min): 13 min  Charges: OT General Charges $OT Visit: 1 Visit OT Treatments $Self Care/Home Management : 8-22 mins  Ivor Messier, OT  Acute Rehabilitation Services Office [phone] Secure chat preferred   Marilynne Drivers 12/23/2023, 12:46 PM

## 2023-12-23 NOTE — Progress Notes (Signed)
 Orthopaedic Trauma Progress Note  SUBJECTIVE: Patient notes she had a rough night, experienced some shooting pains down her leg.  Pain better controlled this morning.  Tolerating her splint well.  Still having some discomfort by the knee pain around surgical incision.  Pain medication helping.  Denies any significant numbness or tingling throughout the leg this morning.  Tolerating diet and fluids.  No chest pain. No SOB. No nausea/vomiting. No other complaints.  Was able to work with therapies yesterday, notes it was more difficult than she anticipated.  Patient appears interested in CIR but has questions in terms of length of stay.  OBJECTIVE:  Vitals:   12/23/23 0449 12/23/23 0809  BP: (!) 96/49 (!) 105/59  Pulse: 61 61  Resp: 19 18  Temp: 97.7 F (36.5 C) 98.3 F (36.8 C)  SpO2: 97% 98%    General: Sitting up in bed, no acute distress Respiratory: No increased work of breathing.  RLE: Well padded, well fitting splint in place.  Soreness over the knee as expected given location of surgical incision.  Nontender through the thigh.   Able to wiggle the toes.  Endorses sensation light touch over the toes, although diminished due to baseline neuropathy.  Motor and sensory exam throughout the ankle and foot limited secondary to splint placement.  Toes warm well-perfused.  IMAGING: Stable post op imaging.   LABS:  Results for orders placed or performed during the hospital encounter of 12/20/23 (from the past 24 hours)  VITAMIN D 25 Hydroxy (Vit-D Deficiency, Fractures)     Status: None   Collection Time: 12/22/23 10:11 AM  Result Value Ref Range   Vit D, 25-Hydroxy 38.13 30 - 100 ng/mL  Basic metabolic panel     Status: Abnormal   Collection Time: 12/23/23  5:50 AM  Result Value Ref Range   Sodium 137 135 - 145 mmol/L   Potassium 2.9 (L) 3.5 - 5.1 mmol/L   Chloride 101 98 - 111 mmol/L   CO2 29 22 - 32 mmol/L   Glucose, Bld 103 (H) 70 - 99 mg/dL   BUN 27 (H) 6 - 20 mg/dL   Creatinine,  Ser 1.61 0.44 - 1.00 mg/dL   Calcium 8.1 (L) 8.9 - 10.3 mg/dL   GFR, Estimated >09 >60 mL/min   Anion gap 7 5 - 15  CBC     Status: Abnormal   Collection Time: 12/23/23  5:50 AM  Result Value Ref Range   WBC 12.4 (H) 4.0 - 10.5 K/uL   RBC 3.50 (L) 3.87 - 5.11 MIL/uL   Hemoglobin 11.7 (L) 12.0 - 15.0 g/dL   HCT 45.4 (L) 09.8 - 11.9 %   MCV 98.3 80.0 - 100.0 fL   MCH 33.4 26.0 - 34.0 pg   MCHC 34.0 30.0 - 36.0 g/dL   RDW 14.7 82.9 - 56.2 %   Platelets 243 150 - 400 K/uL   nRBC 0.0 0.0 - 0.2 %    ASSESSMENT: Annette Lucas is a 53 y.o. female, 2 Days Post-Op s/p IRRIGATION AND DEBRIDEMENT RIGHT LOWER EXTREMITY INTRAMEDULLARY NAILING RIGHT OPEN TYPE II TIBIA FRACTURE  CV/Blood loss: Acute blood loss anemia, Hgb 11.7 this morning. Hemodynamically stable  PLAN: Weightbearing: NWB RLE ROM: Okay for knee and hip range of motion Incisional and dressing care: Dressings left intact until follow-up  Showering: Okay to shower, keep splint dry Orthopedic device(s): Splint RLE Pain management:  1. Tylenol 650 mg q 6 hours scheduled 2. Robaxin 500 mg q 6 hours PRN  3. Oxycodone 5 mg q 4 hours PRN 4. Dilaudid 1 mg q 3 hours PRN 5. Neurontin 300 mg 3 times daily 6. Toradol 15 mg every 6 hours x 5 doses VTE prophylaxis: Lovenox, SCDs ID: Clindamycin postop per open fracture protocol (penicillin allergy) Foley/Lines:  No foley, KVO IVFs Impediments to Fracture Healing: Vitamin D level low normal at 38, will start daily supplementation Dispo: PT/OT evaluation ongoing, patient appears to be CIR candidate.  Awaiting evaluation by admission coordinator.  Okay for discharge from ortho standpoint once cleared by medicine team and therapies  D/C recommendations: -Oxycodone, Robaxin, Neurontin for pain control -Aspirin 325 mg daily x 30 days for DVT prophylaxis -Continue 1000 units Vit D supplementation daily x 90 days  Follow - up plan: 2 weeks after discharge for splint/suture removal and repeat  x-rays   Contact information:  Truitt Merle MD, Thyra Breed PA-C. After hours and holidays please check [email or web address] for group call information for [facility]   Thompson Caul, PA-C [phone] (office) [email or web address]

## 2023-12-23 NOTE — Progress Notes (Signed)
  Inpatient Rehabilitation Admissions Coordinator   Met with patient at bedside for rehab assessment. We discussed goals and expectations of a possible CIR admit. She prefers CIR for rehab. Family can provide intermittent assistance at discharge. 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 Dr Riley Kill reviewed case for medical neccesity today.  I will begin insurance Auth with  Medicaid [employer] . Please call me with any questions.   Ottie Glazier, RN, MSN Rehab Admissions Coordinator [phone]

## 2023-12-23 NOTE — Progress Notes (Addendum)
 Inpatient Rehabilitation Admissions Coordinator   I have insurance approval and CIR bed to admit patient. Spoke with Dr Robb Matar and he wishes to discharge to [facility] Saturday. I met with patient at bedside and she is in agreement. I will make the arrangements. Dr Shearon Stalls will admit Saturday. 5 N nurse can contact rehab in the am for room assignment , give report and timing of admit. [phone].  Ottie Glazier, RN, MSN Rehab Admissions Coordinator [phone] 12/23/2023 3:13 PM

## 2023-12-23 NOTE — Progress Notes (Signed)
 Physical Therapy Treatment Patient Details Name: Annette Lucas MRN: [id number] DOB: 15-Sep-1971 Today's Date: 12/23/2023   History of Present Illness Pt is a 53 y.o. female admitted 12/20/23 with R ankle laceration and pain after slip in home. X-ray showed distal tibial shaft fx. S/p R I & D and Intramedullary nailing of right tibia fracture performed 3/26. NWB. PMH: neuropathy, colitis    PT Comments  The pt was agreeable to session despite continued reports of pain. She demos improved bed mobility, but still needs light assist for management of RLE. Pt also completing sit-stand transfers with improved power in LLE, but continues to need cues for hand placement and intermittent steadying assist for balance or to manage RW. The pt was able to progress ambulation with minA, use of RW, and chair follow, but was limited to 20-30 ft with seated rest between due to fatigue and pain in R knee. Continue to recommend intensive rehab as I feel the pt could improve to modI level with transfers and greater independence with ADLs with short bout of rehab. She is at increased risk of falls with pt reported hx of stroke and neuropathy and therefore will benefit from maximal rehab to achieve independence with transfers and household mobility prior to return home.   If plan is discharge home, recommend the following: A lot of help with walking and/or transfers;A lot of help with bathing/dressing/bathroom;Assistance with cooking/housework;Help with stairs or ramp for entrance;Supervision due to cognitive status   Can travel by private vehicle        Equipment Recommendations   (tub bench, pt is having family check if they have a WC with elevating leg rests)    Recommendations for Other Services       Precautions / Restrictions Precautions Precautions: Fall Recall of Precautions/Restrictions: Intact Required Braces or Orthoses: Splint/Cast Splint/Cast: RLE Restrictions Weight Bearing Restrictions Per  Provider Order: Yes RLE Weight Bearing Per Provider Order: Non weight bearing     Mobility  Bed Mobility Overal bed mobility: Needs Assistance Bed Mobility: Supine to Sit     Supine to sit: Min assist, Used rails, HOB elevated     General bed mobility comments: minA to manage RLE, pt using bed rails to complete scooting to EOB    Transfers Overall transfer level: Needs assistance Equipment used: Rolling Codd (2 wheels) Transfers: Sit to/from Stand, Bed to chair/wheelchair/BSC Sit to Stand: Min assist, From elevated surface   Step pivot transfers: Min assist, +2 safety/equipment       General transfer comment: minA to rise to standing with continued cues for hand placement, minA to steady RW in stance.    Ambulation/Gait Ambulation/Gait assistance: Min assist, +2 safety/equipment Gait Distance (Feet): 20 Feet (+ 35 ft) Assistive device: Rolling Franko (2 wheels)   Gait velocity: decreased Gait velocity interpretation: <1.31 ft/sec, indicative of household ambulator   General Gait Details: hop-to pattern with increased forwards swing at times and intermittent minA to direct and steady. chair follow for seated rest      Balance Overall balance assessment: Needs assistance Sitting-balance support: No upper extremity supported, Feet supported Sitting balance-Leahy Scale: Good     Standing balance support: Bilateral upper extremity supported, During functional activity Standing balance-Leahy Scale: Poor Standing balance comment: dependent on BUE support and external support due to impuslivity                            Communication Communication Communication: No apparent difficulties  Cognition Arousal: Alert Behavior During Therapy: WFL for tasks assessed/performed   PT - Cognitive impairments: No family/caregiver present to determine baseline, Problem solving, Safety/Judgement                       PT - Cognition Comments: pt with less  impulsivity today, continues to need cues for technique and problem solving Following commands: Intact      Cueing Cueing Techniques: Verbal cues  Exercises      General Comments General comments (skin integrity, edema, etc.): VSS on RA      Pertinent Vitals/Pain Pain Assessment Pain Assessment: 0-10 Pain Score: 7  Pain Location: R ankle/knee Pain Descriptors / Indicators: Aching, Sore Pain Intervention(s): Limited activity within patient's tolerance, Monitored during session, Premedicated before session, Repositioned     PT Goals (current goals can now be found in the care plan section) Acute Rehab PT Goals Patient Stated Goal: to return safely home PT Goal Formulation: With patient Time For Goal Achievement: 01/05/24 Potential to Achieve Goals: Good Progress towards PT goals: Progressing toward goals    Frequency    Min 3X/week       AM-PAC PT "6 Clicks" Mobility   Outcome Measure  Help needed turning from your back to your side while in a flat bed without using bedrails?: A Little Help needed moving from lying on your back to sitting on the side of a flat bed without using bedrails?: A Little Help needed moving to and from a bed to a chair (including a wheelchair)?: A Lot Help needed standing up from a chair using your arms (e.g., wheelchair or bedside chair)?: A Lot Help needed to walk in hospital room?: A Lot Help needed climbing 3-5 steps with a railing? : Total 6 Click Score: 13    End of Session Equipment Utilized During Treatment: Gait belt Activity Tolerance: Patient limited by pain Patient left: in chair;with call bell/phone within reach;with chair alarm set Nurse Communication: Mobility status PT Visit Diagnosis: Unsteadiness on feet (R26.81);Muscle weakness (generalized) (M62.81);Pain Pain - Right/Left: Right Pain - part of body: Ankle and joints of foot;Knee     Time: 4098-1191 PT Time Calculation (min) (ACUTE ONLY): 31 min  Charges:     $Gait Training: 8-22 mins $Therapeutic Exercise: 8-22 mins PT General Charges $$ ACUTE PT VISIT: 1 Visit                     Vickki Muff, PT, DPT   Acute Rehabilitation Department Office [phone] Secure Chat Communication Preferred   Ronnie Derby 12/23/2023, 12:44 PM

## 2023-12-24 ENCOUNTER — Encounter (HOSPITAL_COMMUNITY): Payer: Self-pay | Admitting: Physical Medicine and Rehabilitation

## 2023-12-24 ENCOUNTER — Other Ambulatory Visit: Payer: Self-pay

## 2023-12-24 ENCOUNTER — Inpatient Hospital Stay (HOSPITAL_COMMUNITY)
Admission: AD | Admit: 2023-12-24 | Discharge: 2023-12-31 | DRG: 561 | Disposition: A | Discharge status: Home / Self Care | Source: Intra-hospital | Attending: Physical Medicine and Rehabilitation | Admitting: Physical Medicine and Rehabilitation

## 2023-12-24 DIAGNOSIS — S82201D Unspecified fracture of shaft of right tibia, subsequent encounter for closed fracture with routine healing: Secondary | ICD-10-CM | POA: Diagnosis present

## 2023-12-24 DIAGNOSIS — G629 Polyneuropathy, unspecified: Secondary | ICD-10-CM

## 2023-12-24 DIAGNOSIS — I10 Essential (primary) hypertension: Secondary | ICD-10-CM | POA: Diagnosis present

## 2023-12-24 DIAGNOSIS — K589 Irritable bowel syndrome without diarrhea: Secondary | ICD-10-CM | POA: Diagnosis present

## 2023-12-24 DIAGNOSIS — M25572 Pain in left ankle and joints of left foot: Secondary | ICD-10-CM | POA: Diagnosis not present

## 2023-12-24 DIAGNOSIS — S82201S Unspecified fracture of shaft of right tibia, sequela: Secondary | ICD-10-CM | POA: Diagnosis not present

## 2023-12-24 DIAGNOSIS — F1091 Alcohol use, unspecified, in remission: Secondary | ICD-10-CM | POA: Diagnosis present

## 2023-12-24 DIAGNOSIS — K219 Gastro-esophageal reflux disease without esophagitis: Secondary | ICD-10-CM | POA: Diagnosis present

## 2023-12-24 DIAGNOSIS — S82831D Other fracture of upper and lower end of right fibula, subsequent encounter for closed fracture with routine healing: Secondary | ICD-10-CM | POA: Diagnosis not present

## 2023-12-24 DIAGNOSIS — K5901 Slow transit constipation: Secondary | ICD-10-CM | POA: Diagnosis not present

## 2023-12-24 DIAGNOSIS — Z8673 Personal history of transient ischemic attack (TIA), and cerebral infarction without residual deficits: Secondary | ICD-10-CM

## 2023-12-24 DIAGNOSIS — S82401S Unspecified fracture of shaft of right fibula, sequela: Secondary | ICD-10-CM | POA: Diagnosis not present

## 2023-12-24 DIAGNOSIS — F411 Generalized anxiety disorder: Secondary | ICD-10-CM | POA: Insufficient documentation

## 2023-12-24 DIAGNOSIS — Z881 Allergy status to other antibiotic agents status: Secondary | ICD-10-CM

## 2023-12-24 DIAGNOSIS — Z79899 Other long term (current) drug therapy: Secondary | ICD-10-CM | POA: Diagnosis not present

## 2023-12-24 DIAGNOSIS — Z90710 Acquired absence of both cervix and uterus: Secondary | ICD-10-CM

## 2023-12-24 DIAGNOSIS — D72829 Elevated white blood cell count, unspecified: Secondary | ICD-10-CM | POA: Diagnosis present

## 2023-12-24 DIAGNOSIS — R7989 Other specified abnormal findings of blood chemistry: Secondary | ICD-10-CM | POA: Insufficient documentation

## 2023-12-24 DIAGNOSIS — S82301D Unspecified fracture of lower end of right tibia, subsequent encounter for closed fracture with routine healing: Secondary | ICD-10-CM

## 2023-12-24 DIAGNOSIS — E876 Hypokalemia: Secondary | ICD-10-CM | POA: Diagnosis present

## 2023-12-24 DIAGNOSIS — D62 Acute posthemorrhagic anemia: Secondary | ICD-10-CM | POA: Insufficient documentation

## 2023-12-24 DIAGNOSIS — S82891B Other fracture of right lower leg, initial encounter for open fracture type I or II: Secondary | ICD-10-CM | POA: Diagnosis not present

## 2023-12-24 DIAGNOSIS — K5900 Constipation, unspecified: Secondary | ICD-10-CM | POA: Diagnosis present

## 2023-12-24 DIAGNOSIS — M792 Neuralgia and neuritis, unspecified: Secondary | ICD-10-CM | POA: Diagnosis not present

## 2023-12-24 DIAGNOSIS — M79604 Pain in right leg: Secondary | ICD-10-CM | POA: Diagnosis not present

## 2023-12-24 DIAGNOSIS — Z880 Allergy status to penicillin: Secondary | ICD-10-CM

## 2023-12-24 DIAGNOSIS — S82251A Displaced comminuted fracture of shaft of right tibia, initial encounter for closed fracture: Secondary | ICD-10-CM | POA: Diagnosis not present

## 2023-12-24 LAB — COMPREHENSIVE METABOLIC PANEL WITH GFR
ALT: 42 U/L (ref 0–44)
AST: 32 U/L (ref 15–41)
Albumin: 3 g/dL — ABNORMAL LOW (ref 3.5–5.0)
Alkaline Phosphatase: 128 U/L — ABNORMAL HIGH (ref 38–126)
Anion gap: 7 (ref 5–15)
BUN: 18 mg/dL (ref 6–20)
CO2: 30 mmol/L (ref 22–32)
Calcium: 9.1 mg/dL (ref 8.9–10.3)
Chloride: 104 mmol/L (ref 98–111)
Creatinine, Ser: 0.71 mg/dL (ref 0.44–1.00)
GFR, Estimated: >60 mL/min (ref >60)
Glucose, Bld: 96 mg/dL (ref 70–99)
Potassium: 4 mmol/L (ref 3.5–5.1)
Sodium: 141 mmol/L (ref 135–145)
Total Bilirubin: 0.6 mg/dL (ref 0.0–1.2)
Total Protein: 6.7 g/dL (ref 6.5–8.1)

## 2023-12-24 LAB — CBC WITH DIFFERENTIAL/PLATELET
Abs Immature Granulocytes: 0.04 10*3/uL (ref 0.00–0.07)
Basophils Absolute: 0.1 10*3/uL (ref 0.0–0.1)
Basophils Relative: 1 %
Eosinophils Absolute: 0.2 10*3/uL (ref 0.0–0.5)
Eosinophils Relative: 2 %
HCT: 36.6 % (ref 36.0–46.0)
Hemoglobin: 12.2 g/dL (ref 12.0–15.0)
Immature Granulocytes: 0 %
Lymphocytes Relative: 24 %
Lymphs Abs: 2.7 10*3/uL (ref 0.7–4.0)
MCH: 33.7 pg (ref 26.0–34.0)
MCHC: 33.3 g/dL (ref 30.0–36.0)
MCV: 101.1 fL — ABNORMAL HIGH (ref 80.0–100.0)
Monocytes Absolute: 0.8 10*3/uL (ref 0.1–1.0)
Monocytes Relative: 7 %
Neutro Abs: 7.4 10*3/uL (ref 1.7–7.7)
Neutrophils Relative %: 66 %
Platelets: 333 10*3/uL (ref 150–400)
RBC: 3.62 MIL/uL — ABNORMAL LOW (ref 3.87–5.11)
RDW: 15.3 % (ref 11.5–15.5)
WBC: 11.3 10*3/uL — ABNORMAL HIGH (ref 4.0–10.5)
nRBC: 0 % (ref 0.0–0.2)

## 2023-12-24 LAB — BASIC METABOLIC PANEL WITH GFR
Anion gap: 8 (ref 5–15)
BUN: 22 mg/dL — ABNORMAL HIGH (ref 6–20)
CO2: 30 mmol/L (ref 22–32)
Calcium: 9.1 mg/dL (ref 8.9–10.3)
Chloride: 103 mmol/L (ref 98–111)
Creatinine, Ser: 0.76 mg/dL (ref 0.44–1.00)
GFR, Estimated: >60 mL/min (ref >60)
Glucose, Bld: 110 mg/dL — ABNORMAL HIGH (ref 70–99)
Potassium: 3.9 mmol/L (ref 3.5–5.1)
Sodium: 141 mmol/L (ref 135–145)

## 2023-12-24 LAB — CBC
HCT: 33.5 % — ABNORMAL LOW (ref 36.0–46.0)
Hemoglobin: 11.1 g/dL — ABNORMAL LOW (ref 12.0–15.0)
MCH: 33.5 pg (ref 26.0–34.0)
MCHC: 33.1 g/dL (ref 30.0–36.0)
MCV: 101.2 fL — ABNORMAL HIGH (ref 80.0–100.0)
Platelets: 256 10*3/uL (ref 150–400)
RBC: 3.31 MIL/uL — ABNORMAL LOW (ref 3.87–5.11)
RDW: 15.4 % (ref 11.5–15.5)
WBC: 9.8 10*3/uL (ref 4.0–10.5)
nRBC: 0 % (ref 0.0–0.2)

## 2023-12-24 MED ORDER — METHOCARBAMOL 1000 MG/10ML IJ SOLN
500.0000 mg | Freq: Four times a day (QID) | INTRAMUSCULAR | Status: DC
Start: 2023-12-24 — End: 2023-12-31
  Filled 2023-12-24: qty 5

## 2023-12-24 MED ORDER — POTASSIUM CHLORIDE CRYS ER 20 MEQ PO TBCR
20.0000 meq | EXTENDED_RELEASE_TABLET | Freq: Two times a day (BID) | ORAL | Status: DC
  Administered 2023-12-24 – 2023-12-31 (×14): 20 meq via ORAL
  Filled 2023-12-24 (×14): qty 1

## 2023-12-24 MED ORDER — ALUM & MAG HYDROXIDE-SIMETH 200-200-20 MG/5ML PO SUSP: 30.0000 mL | ORAL | Status: DC | PRN

## 2023-12-24 MED ORDER — METHOCARBAMOL 500 MG PO TABS
500.0000 mg | ORAL_TABLET | Freq: Four times a day (QID) | ORAL | Status: DC
  Administered 2023-12-24 – 2023-12-31 (×27): 500 mg via ORAL
  Filled 2023-12-24 (×27): qty 1

## 2023-12-24 MED ORDER — GABAPENTIN 300 MG PO CAPS
300.0000 mg | ORAL_CAPSULE | Freq: Three times a day (TID) | ORAL | Status: DC
  Administered 2023-12-24 – 2023-12-29 (×15): 300 mg via ORAL
  Filled 2023-12-24 (×15): qty 1

## 2023-12-24 MED ORDER — VITAMIN D (ERGOCALCIFEROL) 1.25 MG (50000 UNIT) PO CAPS
50000.0000 [IU] | ORAL_CAPSULE | ORAL | Status: DC
  Administered 2023-12-25: 50000 [IU] via ORAL
  Filled 2023-12-24: qty 1

## 2023-12-24 MED ORDER — POLYETHYLENE GLYCOL 3350 17 G PO PACK
17.0000 g | PACK | Freq: Every day | ORAL | Status: DC
  Administered 2023-12-24 – 2023-12-25 (×2): 17 g via ORAL
  Filled 2023-12-24 (×2): qty 1

## 2023-12-24 MED ORDER — FOLIC ACID 1 MG PO TABS
1.0000 mg | ORAL_TABLET | Freq: Every day | ORAL | Status: DC
  Administered 2023-12-24 – 2023-12-31 (×8): 1 mg via ORAL
  Filled 2023-12-24 (×8): qty 1

## 2023-12-24 MED ORDER — DOCUSATE SODIUM 100 MG PO CAPS
100.0000 mg | ORAL_CAPSULE | Freq: Two times a day (BID) | ORAL | Status: DC
  Administered 2023-12-24: 100 mg via ORAL
  Filled 2023-12-24: qty 1

## 2023-12-24 MED ORDER — PROCHLORPERAZINE MALEATE 5 MG PO TABS: 5.0000 mg | ORAL_TABLET | Freq: Four times a day (QID) | ORAL | Status: DC | PRN

## 2023-12-24 MED ORDER — ENOXAPARIN SODIUM 40 MG/0.4ML IJ SOSY
40.0000 mg | PREFILLED_SYRINGE | INTRAMUSCULAR | Status: DC
  Administered 2023-12-25 – 2023-12-30 (×6): 40 mg via SUBCUTANEOUS
  Filled 2023-12-24 (×6): qty 0.4

## 2023-12-24 MED ORDER — BUSPIRONE HCL 10 MG PO TABS
5.0000 mg | ORAL_TABLET | Freq: Two times a day (BID) | ORAL | Status: DC
  Administered 2023-12-24 – 2023-12-31 (×14): 5 mg via ORAL
  Filled 2023-12-24 (×14): qty 1

## 2023-12-24 MED ORDER — PROCHLORPERAZINE EDISYLATE 10 MG/2ML IJ SOLN: 5.0000 mg | Freq: Four times a day (QID) | INTRAMUSCULAR | Status: DC | PRN

## 2023-12-24 MED ORDER — GUAIFENESIN-DM 100-10 MG/5ML PO SYRP: 5.0000 mL | ORAL_SOLUTION | Freq: Four times a day (QID) | ORAL | Status: DC | PRN

## 2023-12-24 MED ORDER — MELATONIN 5 MG PO TABS: 5.0000 mg | ORAL_TABLET | Freq: Every evening | ORAL | Status: DC | PRN

## 2023-12-24 MED ORDER — HYDROXYZINE HCL 10 MG PO TABS: 10.0000 mg | ORAL_TABLET | Freq: Three times a day (TID) | ORAL | Status: DC | PRN

## 2023-12-24 MED ORDER — TORSEMIDE 20 MG PO TABS
20.0000 mg | ORAL_TABLET | Freq: Two times a day (BID) | ORAL | Status: DC
  Administered 2023-12-24 – 2023-12-31 (×14): 20 mg via ORAL
  Filled 2023-12-24 (×14): qty 1

## 2023-12-24 MED ORDER — BISACODYL 10 MG RE SUPP: 10.0000 mg | Freq: Every day | RECTAL | Status: DC | PRN

## 2023-12-24 MED ORDER — PROCHLORPERAZINE 25 MG RE SUPP: 12.5000 mg | Freq: Four times a day (QID) | RECTAL | Status: DC | PRN

## 2023-12-24 MED ORDER — ACETAMINOPHEN 325 MG PO TABS
325.0000 mg | ORAL_TABLET | ORAL | Status: DC | PRN
  Administered 2023-12-25 – 2023-12-30 (×8): 650 mg via ORAL
  Filled 2023-12-24 (×9): qty 2

## 2023-12-24 MED ORDER — OXYCODONE HCL 5 MG PO TABS
5.0000 mg | ORAL_TABLET | ORAL | Status: DC | PRN
  Administered 2023-12-24 – 2023-12-30 (×14): 5 mg via ORAL
  Filled 2023-12-24 (×16): qty 1

## 2023-12-24 MED ORDER — FLEET ENEMA RE ENEM: 1.0000 | ENEMA | Freq: Once | RECTAL | Status: DC | PRN

## 2023-12-24 NOTE — Progress Notes (Signed)
 Report given to Coralie Keens, LPN in CIR. Patient taken to rm 8. All personal belong taken including cell phone charger, and bag of clothes.

## 2023-12-24 NOTE — Progress Notes (Signed)
 Inpatient Rehabilitation Admission Medication Review by a Pharmacist  A complete drug regimen review was completed for this patient to identify any potential clinically significant medication issues.  High Risk Drug Classes Is patient taking? Indication by Medication  Antipsychotic Yes, as an intravenous medication Compazine - nausea  Anticoagulant Yes Enoxaparin-VTE prophylaxis  Antibiotic No   Opioid Yes Oxycodone - pain  Antiplatelet No   Hypoglycemics/insulin No   Vasoactive Medication Yes Torsemide- antiduretic  Chemotherapy No   Other Yes  Acetaminophen - pain Buspar- chronic anxiety Docusate, Bisacodyl, fleets enema, miralax - bowel regimen /constipation Gabapentin -peripheral neuropathy Hydroxyzine- anxiety Melatonin- sleep Methocarbamol- muscle spasms  Vitamin D, Potassium, folic acid - supplements     Type of Medication Issue Identified Description of Issue Recommendation(s)  Drug Interaction(s) (clinically significant)     Duplicate Therapy     Allergy     No Medication Administration End Date     Incorrect Dose     Additional Drug Therapy Needed     Significant med changes from prior encounter (inform family/care partners about these prior to discharge).  PTA medications:  Aspirin, protonix, Vitamin B12, potassium ,MagOxide resumed on [facility] acute discharge orders but not resumed on CIR admit. Restart PTA meds when and if necessary during CIR admission or at time of discharge, if warranted   Other       Clinically significant medication issues were identified that warrant physician communication and completion of prescribed/recommended actions by midnight of the next day:    Name of provider notified for urgent issues identified:   Provider Method of Notification:     Pharmacist comments:   Time spent performing this drug regimen review (minutes):  20   Noah Delaine, [place] Clinical Pharmacist 12/24/2023 4:48 PM

## 2023-12-24 NOTE — Plan of Care (Signed)
  Problem: Clinical Measurements: Goal: Ability to maintain clinical measurements within normal limits will improve Outcome: Progressing Goal: Diagnostic test results will improve Outcome: Progressing Goal: Respiratory complications will improve Outcome: Progressing   

## 2023-12-24 NOTE — Plan of Care (Signed)
  Problem: RH SAFETY Goal: RH STG ADHERE TO SAFETY PRECAUTIONS W/ASSISTANCE/DEVICE Description: STG Adhere to Safety Precautions With Assistance/Device. Outcome: Progressing   Problem: RH PAIN MANAGEMENT Goal: RH STG PAIN MANAGED AT OR BELOW PT'S PAIN GOAL Description: Pain < 4 with prns Outcome: Progressing   Problem: RH KNOWLEDGE DEFICIT GENERAL Goal: RH STG INCREASE KNOWLEDGE OF SELF CARE AFTER HOSPITALIZATION Outcome: Progressing

## 2023-12-24 NOTE — Discharge Summary (Signed)
 Physician Discharge Summary  Annette Lucas ZOX:[id number] DOB: Feb 04, 1971 DOA: 12/20/2023  PCP: Lianne Moris, PA-C  Admit date: 12/20/2023 Discharge date: 12/24/2023  Admitted From: Home Disposition: Inpatient rehab   Recommendations for Outpatient Follow-up:  Follow up with PCP in 1-2 weeks Please obtain BMP/CBC in one week   Home Health:No Equipment/Devices:None  Discharge Condition:Stable CODE STATUS:Full Diet recommendation: Heart Healthy  Brief/Interim Summary: 53 y.o. female past medical history of peripheral neuropathy on gabapentin comes into the ER after a fall at home in the bathroom and twisted her ankle called EMS due to significant pain and bleeding   Discharge Diagnoses:  Active Problems:   Ankle fracture   Peripheral neuropathy   Chronic anxiety   Closed right ankle fracture  Closed right ankle fracture due to mechanical fall: She status post surgical intervention on 12/21/2023. PT OT evaluated the patient recommended inpatient rehab. Metabolic surgery recommended aspirin for DVT prophylaxis continue narcotics.  Leukocytosis: Likely reactive she remains afebrile.  Peripheral neuropathy: Continue gabapentin.  Chronic anxiety: Continue BuSpar.  Acute blood loss anemia: Hemoglobin dropped from 13-11 after this has remained stable. Likely postsurgical.  Hypokalemia: Replete orally now improved  Discharge Instructions  Discharge Instructions     Diet - low sodium heart healthy   Complete by: As directed    Increase activity slowly   Complete by: As directed       Allergies as of 12/24/2023       Reactions   Levofloxacin Anaphylaxis   Other Anaphylaxis   Meat allergy: Beef, pork, lamb   Penicillins Anaphylaxis   Has patient had a PCN reaction causing immediate rash, facial/tongue/throat swelling, SOB or lightheadedness with hypotension: Yes Has patient had a PCN reaction causing severe rash involving mucus membranes or skin necrosis: No Has  patient had a PCN reaction that required hospitalization Yes Has patient had a PCN reaction occurring within the last 10 years: No If all of the above answers are "NO", then may proceed with Cephalosporin use.        Medication List     TAKE these medications    acetaminophen 325 MG tablet Commonly known as: TYLENOL Take 2 tablets (650 mg total) by mouth every 6 (six) hours as needed for mild pain (pain score 1-3), fever or headache.   ASHWAGANDHA GUMMIES PO Take 2 each by mouth in the morning.   aspirin EC 325 MG tablet Take 1 tablet (325 mg total) by mouth daily.   busPIRone 5 MG tablet Commonly known as: BUSPAR Take 5 mg by mouth 2 (two) times daily.   cyanocobalamin 500 MCG tablet Commonly known as: VITAMIN B12 Take 1 tablet (500 mcg total) by mouth daily. What changed:  how much to take when to take this   folic acid 1 MG tablet Commonly known as: FOLVITE Take 1 tablet (1 mg total) by mouth daily. What changed: when to take this   gabapentin 600 MG tablet Commonly known as: NEURONTIN Take 600 mg by mouth 4 (four) times daily.   ibuprofen 200 MG tablet Commonly known as: ADVIL Take 400 mg by mouth every 6 (six) hours as needed. For pain.   magnesium oxide 400 (241.3 Mg) MG tablet Commonly known as: MAG-OX Take 1 tablet (400 mg total) by mouth daily. What changed: when to take this   methocarbamol 500 MG tablet Commonly known as: ROBAXIN Take 1 tablet (500 mg total) by mouth every 6 (six) hours as needed for muscle spasms.   milk thistle 175 MG  tablet Take 175 mg by mouth in the morning.   oxyCODONE 5 MG immediate release tablet Commonly known as: Oxy IR/ROXICODONE Take 1 tablet (5 mg total) by mouth every 4 (four) hours as needed for moderate pain (pain score 4-6) or severe pain (pain score 7-10).   pantoprazole 40 MG tablet Commonly known as: PROTONIX Take 40 mg by mouth in the morning.   potassium chloride 10 MEQ tablet Commonly known as:  KLOR-CON Take 1 tablet (10 mEq total) by mouth daily. What changed:  how much to take when to take this   QC Tumeric Complex 500 MG Caps Generic drug: Turmeric Take 500 mg by mouth in the morning.   torsemide 20 MG tablet Commonly known as: DEMADEX Take 20 mg by mouth 2 (two) times daily.        Follow-up Information     Haddix, Gillie Manners, MD. Schedule an appointment as soon as possible for a visit in 2 week(s).   Specialty: Orthopedic Surgery Why: for wound check and suture/splint removal Contact information: [address] [place] [address] [phone]                Allergies  Allergen Reactions   Levofloxacin Anaphylaxis   Other Anaphylaxis    Meat allergy: Beef, pork, lamb   Penicillins Anaphylaxis    Has patient had a PCN reaction causing immediate rash, facial/tongue/throat swelling, SOB or lightheadedness with hypotension: Yes Has patient had a PCN reaction causing severe rash involving mucus membranes or skin necrosis: No Has patient had a PCN reaction that required hospitalization Yes Has patient had a PCN reaction occurring within the last 10 years: No If all of the above answers are "NO", then may proceed with Cephalosporin use.     Consultations: Orthopedic surgery   Procedures/Studies: DG Tibia/Fibula Right Port Result Date: 12/21/2023 CLINICAL DATA:  [id number] Fracture 96051 EXAM: PORTABLE RIGHT TIBIA AND FIBULA - 2 VIEW COMPARISON:  X-ray intraoperative right tib fib fracture 12/21/2023 9:11 p.m. X-ray right ankle 12/20/2023 FINDINGS: Status post intramedullary nail fixation of the right tibia shaft fracture. Acute comminuted and displaced distal fibular fracture. No ankle or knee dislocation. Overlying cast. Subcutaneus soft tissue edema. IMPRESSION: 1. Status post intramedullary nail fixation of the right tibia shaft fracture. 2. Acute comminuted and displaced distal fibular fracture. Electronically Signed   By: Tish Frederickson M.D.   On:  12/21/2023 21:17   DG Tibia/Fibula Right Result Date: 12/21/2023 CLINICAL DATA:  Elective surgery. EXAM: RIGHT TIBIA AND FIBULA - 2 VIEW COMPARISON:  12/20/2023. FINDINGS: Five fluoroscopic images were obtained intraoperatively. Total fluoroscopy time is 60 seconds. Dose: 0.93 mGy. There is redemonstration of comminuted fractures of the distal tibia and fibula with interval placement of fixation hardware. Please see operative report for additional information. IMPRESSION: Intraoperative utilization of fluoroscopy. Electronically Signed   By: Thornell Sartorius M.D.   On: 12/21/2023 21:11   DG C-Arm 1-60 Min-No Report Result Date: 12/21/2023 Fluoroscopy was utilized by the requesting physician.  No radiographic interpretation.   DG Ankle Complete Right Result Date: 12/20/2023 CLINICAL DATA:  Status post reduction of right ankle fractures. EXAM: RIGHT ANKLE - COMPLETE 3+ VIEW COMPARISON:  Same day. FINDINGS: The right ankle has been casted and immobilized. There is continued presence of moderately angulated and comminuted fractures involving the distal right tibia and fibula. IMPRESSION: Moderately angulated and comminuted distal right tibial and fibular fractures. Electronically Signed   By: Lupita Raider M.D.   On: 12/20/2023 20:27  DG Ankle Right Port Result Date: 12/20/2023 CLINICAL DATA:  Right ankle laceration after injury. EXAM: PORTABLE RIGHT ANKLE - 2 VIEW COMPARISON:  None Available. FINDINGS: Only a single lateral image was obtained. This demonstrates severely displaced fractures involving the distal right tibia and fibula. IMPRESSION: Severely displaced distal right tibial and fibular fractures. Electronically Signed   By: Lupita Raider M.D.   On: 12/20/2023 19:10   Subjective: No complaints  Discharge Exam: Vitals:   12/24/23 0532 12/24/23 0816  BP: (!) 97/49 110/61  Pulse: 64 69  Resp: 17 16  Temp: 97.8 F (36.6 C) 98 F (36.7 C)  SpO2: 96% 97%   Vitals:   12/23/23 1456  12/23/23 1949 12/24/23 0532 12/24/23 0816  BP: (!) 93/50 108/60 (!) 97/49 110/61  Pulse: 82 90 64 69  Resp: 20 18 17 16   Temp: 98.3 F (36.8 C) 98.7 F (37.1 C) 97.8 F (36.6 C) 98 F (36.7 C)  TempSrc: Oral  Oral   SpO2: 96% 98% 96% 97%  Weight:      Height:        General: Pt is alert, awake, not in acute distress Cardiovascular: RRR, S1/S2 +, no rubs, no gallops Respiratory: CTA bilaterally, no wheezing, no rhonchi Abdominal: Soft, NT, ND, bowel sounds + Extremities: no edema, no cyanosis    The results of significant diagnostics from this hospitalization (including imaging, microbiology, ancillary and laboratory) are listed below for reference.     Microbiology: Recent Results (from the past 240 hours)  Surgical pcr screen     Status: None   Collection Time: 12/21/23  5:28 AM   Specimen: Nasal Mucosa; Nasal Swab  Result Value Ref Range Status   MRSA, PCR NEGATIVE NEGATIVE Final   Staphylococcus aureus NEGATIVE NEGATIVE Final    Comment: (NOTE) The Xpert SA Assay (FDA approved for NASAL specimens in patients 16 years of age and older), is one component of a comprehensive surveillance program. It is not intended to diagnose infection nor to guide or monitor treatment. Performed at [facility], [address], [place], [place] [address]      Labs: BNP (last 3 results) No results for input(s): "BNP" in the last 8760 hours. Basic Metabolic Panel: Recent Labs  Lab 12/20/23 1933 12/21/23 0620 12/21/23 1830 12/22/23 0604 12/23/23 0550 12/24/23 0518  NA 145 143  --  139 137 141  K 3.1* 3.7  --  4.0 2.9* 3.9  CL 113* 108  --  100 101 103  CO2 22 26  --  29 29 30   GLUCOSE 87 104*  --  157* 103* 110*  BUN <5* 5*  --  17 27* 22*  CREATININE 0.44 0.64 0.77 0.98 0.96 0.76  CALCIUM 8.2* 8.5*  --  8.8* 8.1* 9.1  MG  --  1.6*  --   --   --   --   PHOS  --  3.3  --   --   --   --    Liver Function Tests: No results for input(s): "AST", "ALT", "ALKPHOS",  "BILITOT", "PROT", "ALBUMIN" in the last 168 hours. No results for input(s): "LIPASE", "AMYLASE" in the last 168 hours. No results for input(s): "AMMONIA" in the last 168 hours. CBC: Recent Labs  Lab 12/20/23 1933 12/21/23 0620 12/21/23 1830 12/22/23 0604 12/23/23 0550 12/24/23 0518  WBC 9.7 10.6* 12.0* 14.0* 12.4* 9.8  NEUTROABS 7.6  --   --   --   --   --  HGB 12.7 12.5 13.4 12.5 11.7* 11.1*  HCT 36.9 37.1 39.6 37.2 34.4* 33.5*  MCV 97.9 100.3* 100.3* 99.5 98.3 101.2*  PLT 227 241 243 260 243 256   Cardiac Enzymes: No results for input(s): "CKTOTAL", "CKMB", "CKMBINDEX", "TROPONINI" in the last 168 hours. BNP: Invalid input(s): "POCBNP" CBG: No results for input(s): "GLUCAP" in the last 168 hours. D-Dimer No results for input(s): "DDIMER" in the last 72 hours. Hgb A1c No results for input(s): "HGBA1C" in the last 72 hours. Lipid Profile No results for input(s): "CHOL", "HDL", "LDLCALC", "TRIG", "CHOLHDL", "LDLDIRECT" in the last 72 hours. Thyroid function studies No results for input(s): "TSH", "T4TOTAL", "T3FREE", "THYROIDAB" in the last 72 hours.  Invalid input(s): "FREET3" Anemia work up No results for input(s): "VITAMINB12", "FOLATE", "FERRITIN", "TIBC", "IRON", "RETICCTPCT" in the last 72 hours. Urinalysis    Component Value Date/Time   COLORURINE YELLOW 09/13/2022 2010   APPEARANCEUR CLOUDY (A) 09/13/2022 2010   LABSPEC 1.006 09/13/2022 2010   PHURINE 6.0 09/13/2022 2010   GLUCOSEU NEGATIVE 09/13/2022 2010   HGBUR SMALL (A) 09/13/2022 2010   BILIRUBINUR NEGATIVE 09/13/2022 2010   KETONESUR NEGATIVE 09/13/2022 2010   PROTEINUR NEGATIVE 09/13/2022 2010   NITRITE NEGATIVE 09/13/2022 2010   LEUKOCYTESUR LARGE (A) 09/13/2022 2010   Sepsis Labs Recent Labs  Lab 12/21/23 1830 12/22/23 0604 12/23/23 0550 12/24/23 0518  WBC 12.0* 14.0* 12.4* 9.8   Microbiology Recent Results (from the past 240 hours)  Surgical pcr screen     Status: None   Collection  Time: 12/21/23  5:28 AM   Specimen: Nasal Mucosa; Nasal Swab  Result Value Ref Range Status   MRSA, PCR NEGATIVE NEGATIVE Final   Staphylococcus aureus NEGATIVE NEGATIVE Final    Comment: (NOTE) The Xpert SA Assay (FDA approved for NASAL specimens in patients 52 years of age and older), is one component of a comprehensive surveillance program. It is not intended to diagnose infection nor to guide or monitor treatment. Performed at [facility], [address], [place], [place] [address]      Time coordinating discharge: Over 35 minutes  SIGNED:   Marinda Elk, MD  [facility] 12/24/2023, 10:20 AM Pager   If 7PM-7AM, please contact night-coverage [email or web address] Password [email or web address]

## 2023-12-25 DIAGNOSIS — S82401S Unspecified fracture of shaft of right fibula, sequela: Secondary | ICD-10-CM

## 2023-12-25 DIAGNOSIS — K5901 Slow transit constipation: Secondary | ICD-10-CM | POA: Diagnosis not present

## 2023-12-25 DIAGNOSIS — S82201S Unspecified fracture of shaft of right tibia, sequela: Secondary | ICD-10-CM | POA: Diagnosis not present

## 2023-12-25 LAB — MAGNESIUM: Magnesium: 1.7 mg/dL (ref 1.7–2.4)

## 2023-12-25 LAB — VITAMIN B12: Vitamin B-12: 432 pg/mL (ref 180–914)

## 2023-12-25 MED ORDER — MAGNESIUM CITRATE PO SOLN: 1.0000 | Freq: Every day | ORAL | Status: DC | PRN

## 2023-12-25 MED ORDER — SORBITOL 70 % SOLN
30.0000 mL | Freq: Every day | Status: DC | PRN
  Administered 2023-12-25: 30 mL via ORAL
  Filled 2023-12-25: qty 30

## 2023-12-25 MED ORDER — SENNOSIDES-DOCUSATE SODIUM 8.6-50 MG PO TABS
2.0000 | ORAL_TABLET | Freq: Two times a day (BID) | ORAL | Status: DC
  Administered 2023-12-25 – 2023-12-30 (×8): 2 via ORAL
  Filled 2023-12-25 (×12): qty 2

## 2023-12-25 MED ORDER — POLYETHYLENE GLYCOL 3350 17 G PO PACK
17.0000 g | PACK | Freq: Two times a day (BID) | ORAL | Status: DC
  Administered 2023-12-27 (×2): 17 g via ORAL
  Filled 2023-12-25 (×7): qty 1

## 2023-12-25 MED ORDER — PANTOPRAZOLE SODIUM 40 MG PO TBEC
40.0000 mg | DELAYED_RELEASE_TABLET | Freq: Every day | ORAL | Status: DC
  Administered 2023-12-25 – 2023-12-31 (×7): 40 mg via ORAL
  Filled 2023-12-25 (×7): qty 1

## 2023-12-25 MED ORDER — DOCUSATE SODIUM 100 MG PO CAPS
100.0000 mg | ORAL_CAPSULE | Freq: Every day | ORAL | Status: DC
  Administered 2023-12-25 – 2023-12-27 (×3): 100 mg via ORAL
  Filled 2023-12-25 (×6): qty 1

## 2023-12-25 NOTE — H&P (Addendum)
 Expand All Collapse All      Physical Medicine and Rehabilitation Admission H&P        Chief Complaint  Patient presents with   Functional deficits due to ankle fx      HPI:  Annette Lucas is a 53 year old female with history of HTN, IBS, ETOH use in the past, neuropathy, recent stroke who slipped and fell while cleaning the bathromm and struck striking her foot against the doorframe with onset of ankle pain with inability to bear weight as well as large open wound. She was found to have open distal right tib fib fracture. Dr. Jena Gauss consulted due to complexity of wound and patient underwent I and D with Im nailing on 03/26. Post op to be NWB RLE and on Lovenox for DVT prophylaxis. Vitamin D level 38.13 and low normal.  She was hypokalemic at admission with K-3.1  which improved with brief supplementation but down to 2.9 today. Mg 1.6 at admission.     PT/OT has worked with patient who requires min assist +2 for safety with transfers and ADLs. PTA- patient lives alone, works out of home and was independent PTA. CIR recommended due to functional decline.      Review of Systems  Constitutional:  Negative for fever and malaise/fatigue.  HENT:  Negative for hearing loss.   Eyes:  Negative for blurred vision and double vision.  Respiratory:  Negative for cough and shortness of breath.   Cardiovascular:  Negative for chest pain.  Gastrointestinal:  Negative for constipation and heartburn.  Genitourinary:  Negative for dysuria and urgency.  Musculoskeletal:  Negative for myalgias.  Neurological:  Positive for sensory change (no change in numbness feet and hands).  Psychiatric/Behavioral:  The patient does not have insomnia.             Past Medical History:  Diagnosis Date   Allergy to meat     GERD (gastroesophageal reflux disease)     Hypertension     IBS (irritable bowel syndrome)     Neuropathy     Stroke [facility])                 Past Surgical History:  Procedure Laterality  Date   CYSTOSCOPY   07/06/2011    Procedure: CYSTOSCOPY;  Surgeon: Zenaida Niece, MD;  Location: [facility];  Service: Gynecology;  Laterality: N/A;   ENDOMETRIAL ABLATION       EXTERNAL FIXATION LEG Right 12/21/2023    Procedure: EXTERNAL FIXATION, LOWER EXTREMITY;  Surgeon: Roby Lofts, MD;  Location: [facility] OR;  Service: Orthopedics;  Laterality: Right;  zimmer large c arm wound vac with prevena dressing handy bed   INCISION AND DRAINAGE OF WOUND Right 12/21/2023    Procedure: IRRIGATION AND DEBRIDEMENT WOUND;  Surgeon: Roby Lofts, MD;  Location: [facility] OR;  Service: Orthopedics;  Laterality: Right;   LAPAROSCOPIC SALPINGO OOPHERECTOMY Bilateral 09/01/2016    Procedure: LAPAROSCOPIC SALPINGO OOPHORECTOMY;  Surgeon: Lavina Hamman, MD;  Location: [facility];  Service: Gynecology;  Laterality: Bilateral;   VAGINAL HYSTERECTOMY   07/06/2011    Procedure: HYSTERECTOMY VAGINAL;  Surgeon: Zenaida Niece, MD;  Location: [facility];  Service: Gynecology;  Laterality: N/A;          History reviewed. No pertinent family history.         Social History:  reports that she has never smoked. She has never used smokeless tobacco. She reports that she does not currently use alcohol. She reports  that she does not use drugs.     Allergies       Allergies  Allergen Reactions   Levofloxacin Anaphylaxis   Other Anaphylaxis      Meat allergy: Beef, pork, lamb   Penicillins Anaphylaxis      Has patient had a PCN reaction causing immediate rash, facial/tongue/throat swelling, SOB or lightheadedness with hypotension: Yes Has patient had a PCN reaction causing severe rash involving mucus membranes or skin necrosis: No Has patient had a PCN reaction that required hospitalization Yes Has patient had a PCN reaction occurring within the last 10 years: No If all of the above answers are "NO", then may proceed with Cephalosporin use.                Medications Prior to Admission  Medication Sig Dispense Refill    ASHWAGANDHA GUMMIES PO Take 2 each by mouth in the morning.       busPIRone (BUSPAR) 5 MG tablet Take 5 mg by mouth 2 (two) times daily.       folic acid (FOLVITE) 1 MG tablet Take 1 tablet (1 mg total) by mouth daily. (Patient taking differently: Take 1 mg by mouth in the morning.)       gabapentin (NEURONTIN) 600 MG tablet Take 600 mg by mouth 4 (four) times daily.       ibuprofen (ADVIL,MOTRIN) 200 MG tablet Take 400 mg by mouth every 6 (six) hours as needed. For pain.        magnesium oxide (MAG-OX) 400 (241.3 Mg) MG tablet Take 1 tablet (400 mg total) by mouth daily. (Patient taking differently: Take 400 mg by mouth in the morning.) 30 tablet 0   milk thistle 175 MG tablet Take 175 mg by mouth in the morning.       pantoprazole (PROTONIX) 40 MG tablet Take 40 mg by mouth in the morning.       potassium chloride (KLOR-CON) 10 MEQ tablet Take 1 tablet (10 mEq total) by mouth daily. (Patient taking differently: Take 20 mEq by mouth in the morning.) 10 tablet 0   torsemide (DEMADEX) 20 MG tablet Take 20 mg by mouth 2 (two) times daily.       Turmeric (QC TUMERIC COMPLEX) 500 MG CAPS Take 500 mg by mouth in the morning.       vitamin B-12 (CYANOCOBALAMIN) 500 MCG tablet Take 1 tablet (500 mcg total) by mouth daily. (Patient taking differently: Take 5,000 mcg by mouth in the morning.)            Home: Home Living Family/patient expects to be discharged to:: Private residence Living Arrangements: Alone Available Help at Discharge:  (Mom, friends intermittently) Type of Home: House Home Access: Stairs to enter Secretary/administrator of Steps: 2 Entrance Stairs-Rails: Right Home Layout: One level Bathroom Shower/Tub: Health visitor: Standard Bathroom Accessibility: Yes Home Equipment: Agricultural consultant (2 wheels), Rollator (4 wheels), BSC/3in1, Shower seat, Grab bars - toilet, Grab bars - tub/shower, Hand held shower head Additional Comments: Not driving friends/family to  drive  Lives With: Alone   Functional History: Prior Function Prior Level of Function : Independent/Modified Independent Mobility Comments: no use of DME, had just finished 8 months of PT (OPPT and OT) after pt reports of stroke, no hx of stroke in chart. ADLs Comments: pt reports independence   Functional Status:  Mobility: Bed Mobility Overal bed mobility: Needs Assistance Bed Mobility: Supine to Sit Supine to sit: Min assist, Used rails, HOB elevated  General bed mobility comments: Received in recliner Transfers Overall transfer level: Needs assistance Equipment used: Rolling Gentz (2 wheels) Transfers: Bed to chair/wheelchair/BSC Sit to Stand: Min assist, From elevated surface Bed to/from chair/wheelchair/BSC transfer type:: Stand pivot Stand pivot transfers: Contact guard assist Step pivot transfers: Min assist, +2 safety/equipment General transfer comment: Cueing for safety Ambulation/Gait Ambulation/Gait assistance: Min assist, +2 safety/equipment Gait Distance (Feet): 20 Feet (+ 35 ft) Assistive device: Rolling Friedt (2 wheels) General Gait Details: hop-to pattern with increased forwards swing at times and intermittent minA to direct and steady. chair follow for seated rest Gait velocity: decreased Gait velocity interpretation: <1.31 ft/sec, indicative of household ambulator Pre-gait activities: small pivotal steps with RW, minA of 2.   ADL: ADL Overall ADL's : Needs assistance/impaired Eating/Feeding: Set up, Sitting Grooming: Set up, Sitting Upper Body Dressing : Set up, Sitting Lower Body Dressing: Moderate assistance, Sit to/from stand, Cueing for safety, Cueing for sequencing Lower Body Dressing Details (indicate cue type and reason): Pt able to cross legs to don socks, would require assitance for pants Toilet Transfer: Contact guard assist, Stand-pivot, Cueing for safety, BSC/3in1, Rolling Coble (2 wheels) Toilet Transfer Details (indicate cue type and  reason): SPT to BSC, good progress maintaining WB status Toileting- Clothing Manipulation and Hygiene: Total assistance, Sit to/from stand Toileting - Clothing Manipulation Details (indicate cue type and reason): Pt unable to maintain standing balance to wipe Functional mobility during ADLs: Contact guard assist, Cueing for safety, Rolling Majerus (2 wheels) General ADL Comments: Poor safety/judgement   Cognition: Cognition Orientation Level: Oriented X4 Cognition Arousal: Alert Behavior During Therapy: Impulsive     Blood pressure (!) 105/59, pulse 61, temperature 98.3 F (36.8 C), temperature source Oral, resp. rate 18, height 5\' 4"  (1.626 m), weight 61.2 kg, last menstrual period 06/14/2011, SpO2 98%. Physical Exam  PE: Constitution: Appropriate appearance for age. No apparent distress.  Sitting in bed. Resp: No respiratory distress. No accessory muscle usage. on RA and CTAB Cardio: Well perfused appearance. No peripheral edema.  Brisk capillary refill bilaterally. Abdomen: Nondistended. Nontender.   Psych: Appropriate mood and affect. Neuro: AAOx4. No apparent cognitive deficits. Bilateral upper extremity intention tremor, left greater than right.  No ataxia.  Reflexes intact. Bilateral upper lower extremity stocking glove pattern peripheral neuropathy. 5 out of 5 strength in bilateral upper extremities and left lower extremity; right lower extremity 4 out of 5 hip flexion, can wiggle toes antigravity and against resistance  MSK: Right lower extremity cast.  Full active range of motion all other extremities.        Lab Results Last 48 Hours        Results for orders placed or performed during the hospital encounter of 12/20/23 (from the past 48 hours)  CBC     Status: Abnormal    Collection Time: 12/21/23  6:30 PM  Result Value Ref Range    WBC 12.0 (H) 4.0 - 10.5 K/uL    RBC 3.95 3.87 - 5.11 MIL/uL    Hemoglobin 13.4 12.0 - 15.0 g/dL    HCT 95.6 21.3 - 08.6 %    MCV  100.3 (H) 80.0 - 100.0 fL    MCH 33.9 26.0 - 34.0 pg    MCHC 33.8 30.0 - 36.0 g/dL    RDW 57.8 46.9 - 62.9 %    Platelets 243 150 - 400 K/uL    nRBC 0.0 0.0 - 0.2 %      Comment: Performed at [facility], [address] [place], [place] [address]  Creatinine, serum     Status: None    Collection Time: 12/21/23  6:30 PM  Result Value Ref Range    Creatinine, Ser 0.77 0.44 - 1.00 mg/dL    GFR, Estimated >62 >13 mL/min      Comment: (NOTE) Calculated using the CKD-EPI Creatinine Equation (2021) Performed at [facility], [address] [place], [place] [address]    Basic metabolic panel     Status: Abnormal    Collection Time: 12/22/23  6:04 AM  Result Value Ref Range    Sodium 139 135 - 145 mmol/L    Potassium 4.0 3.5 - 5.1 mmol/L    Chloride 100 98 - 111 mmol/L    CO2 29 22 - 32 mmol/L    Glucose, Bld 157 (H) 70 - 99 mg/dL      Comment: Glucose reference range applies only to samples taken after fasting for at least 8 hours.    BUN 17 6 - 20 mg/dL    Creatinine, Ser 8.46 0.44 - 1.00 mg/dL    Calcium 8.8 (L) 8.9 - 10.3 mg/dL    GFR, Estimated >96 >29 mL/min      Comment: (NOTE) Calculated using the CKD-EPI Creatinine Equation (2021)      Anion gap 10 5 - 15      Comment: Performed at [facility], [address] [place], [place] [address]  CBC     Status: Abnormal    Collection Time: 12/22/23  6:04 AM  Result Value Ref Range    WBC 14.0 (H) 4.0 - 10.5 K/uL    RBC 3.74 (L) 3.87 - 5.11 MIL/uL    Hemoglobin 12.5 12.0 - 15.0 g/dL    HCT 32.4 40.1 - 02.7 %    MCV 99.5 80.0 - 100.0 fL    MCH 33.4 26.0 - 34.0 pg    MCHC 33.6 30.0 - 36.0 g/dL    RDW 25.3 66.4 - 40.3 %    Platelets 260 150 - 400 K/uL    nRBC 0.0 0.0 - 0.2 %      Comment: Performed at [facility], [address], [place], [place] [address]  VITAMIN D 25 Hydroxy (Vit-D Deficiency, Fractures)     Status: None    Collection Time: 12/22/23 10:11 AM  Result Value Ref Range    Vit D,  25-Hydroxy 38.13 30 - 100 ng/mL      Comment: (NOTE) Vitamin D deficiency has been defined by the Institute of Medicine  and an Endocrine Society practice guideline as a level of serum 25-OH  vitamin D less than 20 ng/mL (1,2). The Endocrine Society went on to  further define vitamin D insufficiency as a level between 21 and 29  ng/mL (2).   1. IOM (Institute of Medicine). 2010. Dietary reference intakes for  calcium and D. [place]: [employer]. 2. Holick MF, Binkley Lincoln, Bischoff-Ferrari HA, et al. Evaluation,  treatment, and prevention of vitamin D deficiency: an Endocrine  Society clinical practice guideline, JCEM. 2011 Jul; 96(7): 1911-30.   Performed at [facility], [address] [place], [place] [address]    Basic metabolic panel     Status: Abnormal    Collection Time: 12/23/23  5:50 AM  Result Value Ref Range    Sodium 137 135 - 145 mmol/L    Potassium 2.9 (L) 3.5 - 5.1 mmol/L    Chloride 101 98 -  111 mmol/L    CO2 29 22 - 32 mmol/L    Glucose, Bld 103 (H) 70 - 99 mg/dL      Comment: Glucose reference range applies only to samples taken after fasting for at least 8 hours.    BUN 27 (H) 6 - 20 mg/dL    Creatinine, Ser 1.61 0.44 - 1.00 mg/dL    Calcium 8.1 (L) 8.9 - 10.3 mg/dL    GFR, Estimated >09 >60 mL/min      Comment: (NOTE) Calculated using the CKD-EPI Creatinine Equation (2021)      Anion gap 7 5 - 15      Comment: Performed at [facility], [address] [place], [place] [address]  CBC     Status: Abnormal    Collection Time: 12/23/23  5:50 AM  Result Value Ref Range    WBC 12.4 (H) 4.0 - 10.5 K/uL    RBC 3.50 (L) 3.87 - 5.11 MIL/uL    Hemoglobin 11.7 (L) 12.0 - 15.0 g/dL    HCT 81.1 (L) 91.4 - 46.0 %    MCV 98.3 80.0 - 100.0 fL    MCH 33.4 26.0 - 34.0 pg    MCHC 34.0 30.0 - 36.0 g/dL    RDW 78.2 95.6 - 21.3 %    Platelets 243 150 - 400 K/uL    nRBC 0.0 0.0 - 0.2 %      Comment: Performed at [facility],  [address] [place], [place] [address]       Imaging Results (Last 48 hours)  DG Tibia/Fibula Right Port Result Date: 12/21/2023 CLINICAL DATA:  96051 Fracture 96051 EXAM: PORTABLE RIGHT TIBIA AND FIBULA - 2 VIEW COMPARISON:  X-ray intraoperative right tib fib fracture 12/21/2023 9:11 p.m. X-ray right ankle 12/20/2023 FINDINGS: Status post intramedullary nail fixation of the right tibia shaft fracture. Acute comminuted and displaced distal fibular fracture. No ankle or knee dislocation. Overlying cast. Subcutaneus soft tissue edema. IMPRESSION: 1. Status post intramedullary nail fixation of the right tibia shaft fracture. 2. Acute comminuted and displaced distal fibular fracture. Electronically Signed   By: Tish Frederickson M.D.   On: 12/21/2023 21:17    DG Tibia/Fibula Right Result Date: 12/21/2023 CLINICAL DATA:  Elective surgery. EXAM: RIGHT TIBIA AND FIBULA - 2 VIEW COMPARISON:  12/20/2023. FINDINGS: Five fluoroscopic images were obtained intraoperatively. Total fluoroscopy time is 60 seconds. Dose: 0.93 mGy. There is redemonstration of comminuted fractures of the distal tibia and fibula with interval placement of fixation hardware. Please see operative report for additional information. IMPRESSION: Intraoperative utilization of fluoroscopy. Electronically Signed   By: Thornell Sartorius M.D.   On: 12/21/2023 21:11    DG C-Arm 1-60 Min-No Report Result Date: 12/21/2023 Fluoroscopy was utilized by the requesting physician.  No radiographic interpretation.           Blood pressure (!) 105/59, pulse 61, temperature 98.3 F (36.8 C), temperature source Oral, resp. rate 18, height 5\' 4"  (1.626 m), weight 61.2 kg, last menstrual period 06/14/2011, SpO2 98%.   Medical Problem List and Plan: 1. Functional deficits secondary to right tibia fracture status post IM nail             -patient may not shower unless cast is totally covered             -ELOS/Goals: 7 to 10 days, modified independent  PT/OT Stable to admit to inpatient rehab  2.  Antithrombotics: -DVT/anticoagulation:  Pharmaceutical: Lovenox             -  antiplatelet therapy: N/a 3. Pain Management:  Oxycodone prn. Continue robaxin 500 mg and tylenol 650 mg qid.             --ice/heat prn 4. Mood/Behavior/Sleep: LCSW to follow for evaluation and support.              -antipsychotic agents: N/A 5. Neuropsych/cognition: This patient is capable of making decisions on her own behalf. 6. Skin/Wound Care: Routine pressure relief measures.  7. Fluids/Electrolytes/Nutrition: Monitor I/O. Check CMET in am.  8. 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   9. Peripheral edema/HTN: Monitor BP TID. Continue Demadex bid.  10. Anxiety d/o?: ON buspar BID 11. Hypokalemia: Will check daily and supplement aggressively 12. Hypomagnesia: Recheck in am and supplement which will help K+ also 13. Peripheral neuropathy: Continue gabapentin QID. --Was on high dose B12 PTA-->-check level in am.  14. GERD: stable on Protonix 15. Low normal Vitamin D level: Add ergocalciferol for supplement.  16.  Constipation.  Last bowel movement prior to admission.  Daily MiraLAX, Senokot-S 2 tabs twice daily, and as needed sorbitol started on admission.  Jacquelynn Cree, PA-C 12/23/2023  I have examined the patient independently and edited the note for HPI, ROS, exam, assessment, and plan as appropriate. I am in agreement with the above recommendations.   Angelina Sheriff, DO 12/25/2023

## 2023-12-25 NOTE — Progress Notes (Signed)
 Angelina Sheriff, DO  Physician Physical Medicine and Rehabilitation   PMR Pre-admission     Signed   Date of Service: 12/23/2023 12:22 PM  Related encounter: ED to Hosp-Admission (Discharged) from 12/20/2023 in [facility]   Signed     Expand All Collapse All  Show:Clear all [x] Written[x] Templated[x] Copied  Added by: [x] De Jaworski, Tye [place], RN[x] Angelina Sheriff, DO  [] Hover for details PMR Admission Coordinator Pre-Admission Assessment   Patient: Annette Lucas is an 53 y.o., female MRN: [id number] DOB: December 10, 1970 Height: 5\' 4"  (162.6 cm) Weight: 61.2 kg   Insurance Information HMO:     PPO:      PCP:      IPA:      80/20:      OTHER:  PRIMARY: [place] [employer] [place]      Policy#: [id number]; Medicaid # [id number]      Subscriber: pt CM Name: via fax      Phone#: [phone]     Fax#: [phone] Pre-Cert#: [id number] approved 3/29 until 12/31/23     Employer:  Benefits:  Phone #: [phone]     Name: 3/28 Eff. Date: 01/26/2023 until 01/24/2025     Deduct: none      Out of Pocket Max: none      Life Max: none CIR: Per medicaid guidelines      SNF: per medicaid guidelines Outpatient: per medicaid guidelines    Co-Pay:  Home Health: per medicaid guidelines      Co-Pay:  DME: per medicaid guidelines     Co-Pay:  Providers: in network  SECONDARY: none      Policy#:      Phone#:    Artist:       Phone#:    The Data processing manager" for patients in Inpatient Rehabilitation Facilities with attached "Privacy Act Statement-Health Care Records" was provided and verbally reviewed with: Patient   Emergency Contact Information Contact Information       Name Relation Home Work Mobile    Shockley,Joyce Mother     [phone]         Other Contacts   None on File      Current Medical History  Patient Admitting Diagnosis: Right Tibia Fracture   History of Present Illness: 53 yo female with  history for peripheral neuropathy on gabapentin, GERD who presented on 12/20/23 to [facility] after a fall form home. She slipped in the bathroom and hitting the door frame.    Imaging revealed right open distal tibia and fibula fracture. Ortho consulted. Underwent I and D of right open tibia fracture and IM nailing of right tibia fracture on 12/21/23. Postoperative management for pain management. ASA for 30 days for DVT prophylaxis. Vit D supplementation.    Patient's medical record from [facility] has been reviewed by the rehabilitation admission coordinator and physician.   Past Medical History      Past Medical History:  Diagnosis Date   Allergy to meat     GERD (gastroesophageal reflux disease)     Hypertension     IBS (irritable bowel syndrome)     Neuropathy     Stroke [facility])          Has the patient had major surgery during 100 days prior to admission? Yes   Family History   family history is not on file.   Current Medications  Current Medications    Current Facility-Administered Medications:  acetaminophen (TYLENOL) tablet 650 mg, 650 mg, Oral, Q6H, [place], PA-C, 650 mg at 12/23/23 1241   busPIRone (BUSPAR) tablet 5 mg, 5 mg, Oral, BID, [place]ali, PA-C, 5 mg at 12/23/23 8295   cholecalciferol (VITAMIN D3) 25 MCG (1000 UNIT) tablet 1,000 Units, 1,000 Units, Oral, Daily, [place]ali, PA-C, 1,000 Units at 12/23/23 1241   docusate sodium (COLACE) capsule 100 mg, 100 mg, Oral, BID, [place]ali, PA-C, 100 mg at 12/23/23 0924   enoxaparin (LOVENOX) injection 40 mg, 40 mg, Subcutaneous, Q24H, [place] Bali, PA-C, 40 mg at 12/23/23 6213   folic acid (FOLVITE) tablet 1 mg, 1 mg, Oral, Daily, [place] Bali, PA-C, 1 mg at 12/23/23 0865   gabapentin (NEURONTIN) capsule 300 mg, 300 mg, Oral, TID, [place] Bali, PA-C, 300 mg at 12/23/23 1633   HYDROmorphone (DILAUDID) injection 1 mg, 1 mg, Intravenous, Q3H PRN, [place] Bali, PA-C, 1 mg at  12/21/23 7846   hydrOXYzine (ATARAX) tablet 10 mg, 10 mg, Oral, TID PRN, Marinda Elk, MD, 10 mg at 12/21/23 2129   melatonin tablet 5 mg, 5 mg, Oral, QHS PRN, [place], PA-C   methocarbamol (ROBAXIN) tablet 500 mg, 500 mg, Oral, QID, 500 mg at 12/23/23 1633 **OR** methocarbamol (ROBAXIN) injection 500 mg, 500 mg, Intravenous, QID, McClung, Sarah A, PA-C   oxyCODONE (Oxy IR/ROXICODONE) immediate release tablet 5 mg, 5 mg, Oral, Q4H PRN, McClung, Sarah A, PA-C, 5 mg at 12/22/23 1020   polyethylene glycol (MIRALAX / GLYCOLAX) packet 17 g, 17 g, Oral, Daily PRN, [place] Bali, PA-C, 17 g at 12/23/23 9629   potassium chloride (KLOR-CON) CR tablet 10 mEq, 10 mEq, Oral, Daily, [place], PA-C, 10 mEq at 12/23/23 5284   prochlorperazine (COMPAZINE) injection 5 mg, 5 mg, Intravenous, Q6H PRN, [place] Bali, PA-C   torsemide [facility]) tablet 20 mg, 20 mg, Oral, BID, [place] B[place], PA-C, 20 mg at 12/23/23 1633     Patients Current Diet:  Diet Order                  Diet regular Room service appropriate? Yes; Fluid consistency: Thin  Diet effective now                       Precautions / Restrictions Precautions Precautions: Fall Restrictions Weight Bearing Restrictions Per Provider Order: Yes RLE Weight Bearing Per Provider Order: Non weight bearing    Has the patient had 2 or more falls or a fall with injury in the past year? Yes   Prior Activity Level Community (5-7x/wk): Independent without AD, does not drive, works remote form home   Prior Functional Level Self Care: Did the patient need help bathing, dressing, using the toilet or eating? Independent   Indoor Mobility: Did the patient need assistance with walking from room to room (with or without device)? Independent   Stairs: Did the patient need assistance with internal or external stairs (with or without device)? Independent   Functional Cognition: Did the patient need help planning regular  tasks such as shopping or remembering to take medications? Independent   Patient Information Are you of Hispanic, Latino/a,or Spanish origin?: A. No, not of Hispanic, Latino/a, or Spanish origin What is your race?: A. White Do you need or want an interpreter to communicate with a doctor or health care staff?: 0. No   Patient's Response To:  Health Literacy and Transportation Is the patient able  to respond to health literacy and transportation needs?: Yes Health Literacy - How often do you need to have someone help you when you read instructions, pamphlets, or other written material from your doctor or pharmacy?: Never In the past 12 months, has lack of transportation kept you from medical appointments or from getting medications?: No In the past 12 months, has lack of transportation kept you from meetings, work, or from getting things needed for daily living?: No   Home Assistive Devices / Equipment Home Equipment: Agricultural consultant (2 wheels), Rollator (4 wheels), BSC/3in1, Shower seat, Grab bars - toilet, Grab bars - tub/shower, Hand held shower head   Prior Device Use: Indicate devices/aids used by the patient prior to current illness, exacerbation or injury? 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 not fully tested at foot due to splint/NWB. pt reports chronically  decreased senation in foot, is able to move toes. RLE: Unable to fully assess due to pain, Unable to fully assess due to immobilization RLE Sensation: decreased light touch, history of peripheral neuropathy LLE Deficits / Details: grossly functional, hx of peripheral neuropathy LLE Sensation: history of peripheral neuropathy     ADLs   Overall ADL's : Needs assistance/impaired Eating/Feeding: Set up, Sitting Grooming: Set up, Sitting Upper Body Dressing : Set up, Sitting Lower Body Dressing: Moderate assistance, Sit to/from stand, Cueing for safety, Cueing for sequencing Lower Body Dressing Details (indicate cue type and reason): Pt able to cross legs to don socks, would require assitance for pants Toilet Transfer: Contact guard assist, Stand-pivot, Cueing for safety, BSC/3in1, Rolling [place] (2 wheels) Toilet Transfer Details (indicate cue type and reason): SPT to BSC, good progress maintaining WB status Toileting- Clothing Manipulation and Hygiene: Total assistance, Sit to/from stand Toileting - Clothing Manipulation Details (indicate cue type and reason): Pt unable to maintain standing balance to wipe Functional mobility during ADLs: Contact guard assist, Cueing for safety, Rolling [place] (2 wheels) General ADL Comments: Poor safety/judgement     Mobility   Overal bed mobility: Needs Assistance Bed Mobility: Supine to Sit Supine to sit: Min assist, Used rails, HOB elevated General bed mobility comments: Received in recliner     Transfers   Overall transfer level: Needs assistance Equipment used: Rolling [place] (2 wheels) Transfers: Bed to chair/wheelchair/BSC Sit to Stand: Min assist, From elevated surface Bed to/from chair/wheelchair/BSC transfer type:: Stand pivot Stand pivot transfers: Contact guard assist Step pivot transfers: Min assist, +2 safety/equipment General transfer comment: Cueing for safety     Ambulation / Gait / Stairs / Wheelchair Mobility    Ambulation/Gait Ambulation/Gait assistance: Min assist, +2 safety/equipment Gait Distance (Feet): 20 Feet (+ 35 ft) Assistive device: Rolling [place] (2 wheels) General Gait Details: hop-to pattern with increased forwards swing at times and intermittent minA to direct and steady. chair follow for seated rest Gait velocity: decreased Gait velocity interpretation: <1.31 ft/sec, indicative of household ambulator Pre-gait activities: small pivotal steps with RW,  minA of 2.     Posture / Balance Balance Overall balance assessment: Needs assistance Sitting-balance support: No upper extremity supported, Feet supported Sitting balance-Leahy Scale: Fair Standing balance support: Bilateral upper extremity supported, During functional activity, Reliant on assistive device for balance Standing balance-Leahy Scale: Poor Standing balance comment: Reliant on RW     Special needs/care consideration      Previous Home Environment  Living Arrangements: Alone  Lives With: Alone Available Help at Discharge:  (Mom, friends intermittently) Type of Home: House Home Layout: One level Home Access: Stairs to enter Entrance Stairs-Rails: Right Entrance Stairs-Number of Steps: 2 Bathroom Shower/Tub: Health visitor: Standard Bathroom Accessibility: Yes How Accessible: Accessible via [place] Home Care Services: No (Just completed OP therapy) Additional Comments: Not driving friends/family to drive   Discharge Living Setting Plans for Discharge Living Setting: Patient's home, Alone, House Type of Home at Discharge: House Discharge Home Layout: One level Discharge Home Access: Stairs to enter Entrance Stairs-Rails: Right Entrance Stairs-Number of Steps: 2 Discharge Bathroom Shower/Tub: Walk-in shower Discharge Bathroom Toilet: Standard Discharge Bathroom Accessibility: Yes How Accessible: Accessible via [place] Does the patient have any problems obtaining your medications?: No    Social/Family/Support Systems Patient Roles:  (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) Contact Information: Mom, Printmaker Anticipated Caregiver: Mom and friends intermittently Anticipated Industrial/product designer Information: see contacts Ability/Limitations of Caregiver: prn assist Caregiver Availability: Intermittent Discharge Plan Discussed with Primary Caregiver: Yes Is Caregiver In Agreement with Plan?: Yes Does Caregiver/Family have Issues with Lodging/Transportation while Pt is in Rehab?: No   Goals Patient/Family Goal for Rehab: Mod I to intermittent supervision with PT and OT Expected length of stay: ELOS 7 to 10 days Pt/Family Agrees to Admission and willing to participate: Yes Program Orientation Provided & Reviewed with Pt/Caregiver Including Roles  & Responsibilities: Yes   Decrease burden of Care through IP rehab admission: n/a   Possible need for SNF placement upon discharge: not anticipated   Patient Condition: I have reviewed medical records from [facility],  met with patient at bedside for inpatient rehabilitation assessment.  Patient will benefit from ongoing PT and OT, can actively participate in 3 hours of therapy a day 5 days of the week, and can make measurable gains during the admission.  Patient will also benefit from the coordinated team approach during an Inpatient Acute Rehabilitation admission.  The patient will receive intensive therapy as well as Rehabilitation physician, nursing, social worker, and care management interventions.  Due to bladder management, bowel management, safety, skin/wound care, disease management, medication administration, pain management, and patient education the patient requires 24 hour a day rehabilitation nursing.  The patient is currently min assist overall with mobility and basic ADLs.  Discharge setting and therapy post discharge at home with home health is anticipated.  Patient has agreed to participate in the Acute Inpatient  Rehabilitation Program and will admit 12/24/23.   Preadmission Screen Completed By:  Clois Dupes, RN MSN 12/23/2023 4:58 PM ______________________________________________________________________   Discussed status with Dr. Shearon Stalls on 12/23/23 at 1500 and received approval for admission 12/24/23   Admission Coordinator:  Clois Dupes, RN MSN time 1500 Date 12/23/23    Assessment/Plan: Diagnosis: Right tibia fx s/p I&D and IMN  Does the need for close, 24 hr/day Medical supervision in concert with the patient's rehab needs make it unreasonable for this patient to be served in a less intensive setting? Yes Co-Morbidities requiring supervision/potential complications: post-op pain,  leukocytosis,  hypokalemia,  anemia, peripheral neuropathy, and orthostatic hypotension Due to bladder management, bowel management, safety, skin/wound care, disease management, medication administration, pain management, and patient education, does the patient require 24 hr/day rehab nursing? Yes Does the patient require coordinated care of a physician, rehab nurse, PT, OT to address physical and functional deficits in the context of the above medical diagnosis(es)? Yes Addressing deficits in the following areas: balance, endurance, locomotion, strength, transferring, bathing, dressing, feeding, grooming, and toileting Can the patient actively participate in an intensive therapy program of at least 3 hrs of therapy 5 days a week? Yes The potential for patient to make measurable gains while on inpatient rehab is good Anticipated functional outcomes upon discharge from inpatient rehab: modified independent PT, modified independent OT, Estimated rehab length of stay to reach the above functional goals is: 7-10 days Anticipated discharge destination: Home 10. Overall Rehab/Functional Prognosis: good     MD Signature:   Angelina Sheriff, DO 12/24/2023           Revision History

## 2023-12-25 NOTE — Evaluation (Signed)
 Physical Therapy Assessment and Plan  Patient Details  Name: Annette Lucas MRN: [id number] Date of Birth: 03-Jul-1971  PT Diagnosis: Abnormal posture, Abnormality of gait, Difficulty walking, Edema, Impaired sensation, and Muscle weakness Rehab Potential: Good ELOS: 7-10 days   Today's Date: 12/25/2023 PT Individual Time: 4098-1191, 4782-9562 PT Individual Time Calculation (min): 74 min, 41 min   Hospital Problem: Principal Problem:   Tibia/fibula fracture, right, sequela   Past Medical History:  Past Medical History:  Diagnosis Date   Allergy to meat    GERD (gastroesophageal reflux disease)    Hypertension    IBS (irritable bowel syndrome)    Neuropathy    Stroke [facility])    Past Surgical History:  Past Surgical History:  Procedure Laterality Date   CYSTOSCOPY  07/06/2011   Procedure: CYSTOSCOPY;  Surgeon: Zenaida Niece, MD;  Location: [facility] ORS;  Service: Gynecology;  Laterality: N/A;   ENDOMETRIAL ABLATION     EXTERNAL FIXATION LEG Right 12/21/2023   Procedure: EXTERNAL FIXATION, LOWER EXTREMITY;  Surgeon: Roby Lofts, MD;  Location: [facility] OR;  Service: Orthopedics;  Laterality: Right;  zimmer large c arm wound vac with prevena dressing handy bed   INCISION AND DRAINAGE OF WOUND Right 12/21/2023   Procedure: IRRIGATION AND DEBRIDEMENT WOUND;  Surgeon: Roby Lofts, MD;  Location: [facility] OR;  Service: Orthopedics;  Laterality: Right;   LAPAROSCOPIC SALPINGO OOPHERECTOMY Bilateral 09/01/2016   Procedure: LAPAROSCOPIC SALPINGO OOPHORECTOMY;  Surgeon: Lavina Hamman, MD;  Location: [facility];  Service: Gynecology;  Laterality: Bilateral;   VAGINAL HYSTERECTOMY  07/06/2011   Procedure: HYSTERECTOMY VAGINAL;  Surgeon: Zenaida Niece, MD;  Location: [facility];  Service: Gynecology;  Laterality: N/A;    Assessment & Plan Clinical Impression: Patient is a 53 y.o. year old female with history of HTN, IBS, ETOH use in the past, neuropathy, recent stroke who slipped and fell while cleaning  the bathromm and struck striking her foot against the doorframe with onset of ankle pain with inability to bear weight as well as large open wound. She was found to have open distal right tib fib fracture. Dr. Jena Gauss consulted due to complexity of wound and patient underwent I and D with Im nailing on 03/26. Post op to be NWB RLE and on Lovenox for DVT prophylaxis. Vitamin D level 38.13 and low normal. She was hypokalemic at admission with K-3.1 which improved with brief supplementation but down to 2.9 today. Mg 1.6 at admission.    Patient currently requires min with mobility secondary to muscle weakness, decreased cardiorespiratoy endurance, and decreased standing balance, decreased balance strategies, and difficulty maintaining precautions.  Prior to hospitalization, patient was independent  with mobility and lived with Alone in a House home.  Home access is 2 going into back doorStairs to enter.  Patient will benefit from skilled PT intervention to maximize safe functional mobility, minimize fall risk, and decrease caregiver burden for planned discharge home with intermittent assist.  Anticipate patient will benefit from follow up OP at discharge.  PT - End of Session Activity Tolerance: Tolerates 30+ min activity with multiple rests Endurance Deficit: Yes PT Assessment Rehab Potential (ACUTE/IP ONLY): Good PT Barriers to Discharge: Inaccessible home environment;Decreased caregiver support;Home environment access/layout;Wound Care;Weight;Weight bearing restrictions PT Barriers to Discharge Comments: pain PT Patient demonstrates impairments in the following area(s): Balance;Endurance;Edema;Motor;Pain;Sensory;Skin Integrity PT Transfers Functional Problem(s): Bed Mobility;Bed to Chair;Car;Furniture PT Locomotion Functional Problem(s): Ambulation;Wheelchair Mobility;Stairs PT Plan PT Intensity: Minimum of 1-2 x/day ,45 to 90 minutes PT Frequency: 5 out  of 7 days PT Duration Estimated Length of  Stay: 7-10 days PT Treatment/Interventions: Ambulation/gait training;Community reintegration;DME/adaptive equipment instruction;Neuromuscular re-education;Psychosocial support;Stair training;UE/LE Strength taining/ROM;Wheelchair propulsion/positioning;Balance/vestibular training;Discharge planning;Pain management;Skin care/wound management;Therapeutic Activities;UE/LE Coordination activities;Cognitive remediation/compensation;Disease management/prevention;Functional mobility training;Splinting/orthotics;Patient/family education;Therapeutic Exercise;Visual/perceptual remediation/compensation PT Transfers Anticipated Outcome(s): mod I PT Locomotion Anticipated Outcome(s): mod I PT Recommendation Recommendations for Other Services: Neuropsych consult Follow Up Recommendations: Outpatient PT Patient destination: Home Equipment Details: pt has RW, Rollator, cane, WC, shower chair   PT Evaluation Precautions/Restrictions Precautions Precautions: Fall Recall of Precautions/Restrictions: Intact Required Braces or Orthoses: Splint/Cast Splint/Cast: RLE Restrictions Weight Bearing Restrictions Per Provider Order: Yes RLE Weight Bearing Per Provider Order: Non weight bearing Pain Interference Pain Interference Pain Effect on Sleep: 4. Almost constantly Pain Interference with Therapy Activities: 1. Rarely or not at all Pain Interference with Day-to-Day Activities: 2. Occasionally Home Living/Prior Functioning Home Living Available Help at Discharge: Available PRN/intermittently (pt family able to help intermittently) Type of Home: House Home Access: Stairs to enter [employer] of Steps: 2 going into back door Entrance Stairs-Rails: Right Home Layout: One level Bathroom Shower/Tub: Walk-in Contractor: Standard Bathroom Accessibility: Yes Additional Comments: Not driving at baseline 2/2 neuropathy- friends/family to drive to appointment  Lives With: Alone Prior  Function Level of Independence: Independent with basic ADLs;Independent with gait;Independent with transfers;Independent with homemaking with ambulation  Able to Take Stairs?: Yes Driving: No (not driving at baseline 2/2 neuropathy) Vocation: Full time employment 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 Leisure: Hobbies-yes (Comment) Vision/Perception  Vision - History Ability to See in Adequate Light: 0 Adequate Perception Perception: Within Functional Limits Praxis Praxis: WFL  Cognition Overall Cognitive Status: Within Functional Limits for tasks assessed Arousal/Alertness: Awake/alert Orientation Level: Oriented X4 Year: 2025 Month: March Day of Week: Correct Memory: Appears intact Awareness: Appears intact Problem Solving: Appears intact Safety/Judgment: Appears intact Sensation Sensation Light Touch: Impaired by gross assessment Additional Comments: baseline neuropathy in hands and feet at baseline Coordination Gross Motor Movements are Fluid and Coordinated: No Fine Motor Movements are Fluid and Coordinated: Yes Coordination and Movement Description: RLE NWB Motor  Motor Motor: Other (comment) Motor - Skilled Clinical Observations: generalized weakness and NWB RLE   Trunk/Postural Assessment  Cervical Assessment Cervical Assessment: Within Functional Limits Thoracic Assessment Thoracic Assessment: Within Functional Limits Lumbar Assessment Lumbar Assessment: Within Functional Limits Postural Control Postural Control: Deficits on evaluation (delayed)  Balance Balance Balance Assessed: Yes Static Sitting Balance Static Sitting - Balance Support: No upper extremity supported Static Sitting - Level of Assistance: 5: Stand by assistance Static Standing Balance Static Standing - Balance Support: Bilateral upper extremity supported;During functional activity Static Standing - Level of Assistance: 5: Stand by assistance (CGA) Dynamic  Standing Balance Dynamic Standing - Balance Support: Bilateral upper extremity supported;During functional activity Dynamic Standing - Level of Assistance: 4: Min assist Extremity Assessment  RUE Assessment RUE Assessment: Within Functional Limits LUE Assessment LUE Assessment: Within Functional Limits RLE Assessment RLE Assessment: Within Functional Limits LLE Assessment LLE Assessment: Exceptions to [facility] General Strength Comments: grossly 3/5; difficult to assess 2/2 cast and weight bearing restrictions  Care Tool Care Tool Bed Mobility Roll left and right activity Roll left and right activity did not occur: Refused (pt reports she did not lay on side at baseline)      Sit to lying activity   Sit to lying assist level: Supervision/Verbal cueing    Lying to sitting on side of bed activity   Lying to sitting on side of  bed assist level: the ability to move from lying on the back to sitting on the side of the bed with no back support.: Supervision/Verbal cueing     Care Tool Transfers Sit to stand transfer   Sit to stand assist level: Contact Guard/Touching assist (RW)    Chair/bed transfer   Chair/bed transfer assist level: Minimal Assistance - Patient > 75% (RW)    Careers adviser transfer assist level: Minimal Assistance - Patient > 75% (RW)      Care Tool Locomotion Ambulation   Assist level: Contact Guard/Touching assist Assistive device: Braatz-rolling Max distance: 33 feet  Walk 10 feet activity   Assist level: Contact Guard/Touching assist Assistive device: Mellor-rolling   Walk 50 feet with 2 turns activity Walk 50 feet with 2 turns activity did not occur: Safety/medical concerns (pain/fatigue)      Walk 150 feet activity Walk 150 feet activity did not occur: Safety/medical concerns      Walk 10 feet on uneven surfaces activity Walk 10 feet on uneven surfaces activity did not occur: Safety/medical concerns      Stairs   Assist level: Moderate  Assistance - Patient - 50 - 74% (shower chair technique) Stairs assistive device: 1 hand rail Max number of stairs: 1  Walk up/down 1 step activity   Walk up/down 1 step (curb) assist level: Moderate Assistance - Patient - 50 - 74% Walk up/down 1 step or curb assistive device: 1 hand rail  Walk up/down 4 steps activity Walk up/down 4 steps activity did not occur: Safety/medical concerns      Walk up/down 12 steps activity Walk up/down 12 steps activity did not occur: Safety/medical concerns      Pick up small objects from floor Pick up small object from the floor (from standing position) activity did not occur: Safety/medical concerns (safety/weight bearing restrictions)      Wheelchair Is the patient using a wheelchair?: Yes Type of Wheelchair: Manual     Max wheelchair distance: 107  Wheel 50 feet with 2 turns activity   Assist Level: Supervision/Verbal cueing  Wheel 150 feet activity   Assist Level: Moderate Assistance - Patient 50 - 74%    Refer to Care Plan for Long Term Goals  SHORT TERM GOAL WEEK 1 PT Short Term Goal 1 (Week 1): STG=LTG 2/2 ELOS  Recommendations for other services: None   Skilled Therapeutic Intervention Mobility Bed Mobility Bed Mobility: Supine to Sit;Sit to Supine Supine to Sit: Supervision/Verbal cueing Sit to Supine: Supervision/Verbal cueing Transfers Transfers: Stand to Sit;Sit to Stand;Stand Pivot Transfers Sit to Stand: Contact Guard/Touching assist Stand to Sit: Contact Guard/Touching assist Stand Pivot Transfers: Minimal Assistance - Patient > 75% Stand Pivot Transfer Details: Verbal cues for precautions/safety;Verbal cues for safe use of DME/AE;Verbal cues for technique Transfer (Assistive device): Rolling Seder Locomotion  Gait Ambulation: Yes Gait Assistance: Contact Guard/Touching assist Gait Distance (Feet): 33 Feet Assistive device: Rolling Cessna Gait Gait: Yes Gait Pattern: Impaired (hop to gait) Gait velocity:  decreased Stairs / Additional Locomotion Stairs: Yes Stairs Assistance: Moderate Assistance - Patient 50 - 74% (shower chair technique) Stair Management Technique: One rail Right Number of Stairs: 1 Height of Stairs: 6 Wheelchair Mobility Wheelchair Mobility: Yes Wheelchair Assistance: Doctor, general practice: Both upper extremities Wheelchair Parts Management: Needs assistance   Discharge Criteria: Patient will be discharged from PT if patient refuses treatment 3 consecutive times without medical reason, if treatment goals not met, if there is a change in medical status,  if patient makes no progress towards goals or if patient is discharged from hospital.  The above assessment, treatment plan, treatment alternatives and goals were discussed and mutually agreed upon: by patient  Today's Interventions  Treatment Session 1   Pt seated in WC upon arrival. Pt agreeable to therapy. Pt reports 8/10 pain in R knee, nurse present to administer pain medication, therapist provided rest breaks and repositioning,    Evaluation completed (see details above and below) with education on PT POC and goals and individual treatment initiated with focus on transfer training.   Pt able to provide teach back of R LE NWBing precautions.   Pt performed bed mobility on apartment bed with supervision, pt unwilling to attempt rolling in either direction 2/2 did not lay on side at baseline and for pain management. Supine to sit and sit to supine with supervision and increased time.   Sit to stand with RW and CGA, stand pivot transfer with RW and min A. Verbal cues provided for technqiue and to slow down for safety/stability.   Pt ambulated 33 feet with RW and CGA with hop to gait prior to requiring seated rest break for pain/fatigue.   Discussed home set up and DME: pt reports he has a RW, WC, cane, Rollator, BSC and shower chair. Pt reports WC able to fit throughout majority of house,  but some areas require RW. Home measurement sheet provided. Recommended pt purchase a reacher for picking up objects at home for pt overall safety and to reduce fall risk 2/2 R LE NWBing precautions. Recommended pt have family member bring a pair of tennis shoes as pt currently only has slippers.   Discussed home entry: 2 steps with R ascending handrail. Therapist demonstrated stair navigation with shower chair technique. Pt required min A for stand pivot transfer WC to shower chair, and mod A for stair navigation x1 step for maintenance of R LE NWBing precuations and assist with controlled descent. Verbal and tactile cues provided for technique.   Pt seated in WC at end of session with all needs within reach and chair alarm on.   Treatment Session 2   Pt seated in WC upon arrival. Pt agreeable to therapy. Pt reports 7/10 R knee pain, premedicated, therapist provided rest breaks and repositioning throughout session and donned ice at end of session.   Pt transported dependent in [facility] outside for time/energy conservation.   Pt performed sit to stand x10 with RW and CGA progressing to close supervision, with pt trailing various UE positioning techniques. pt demos preference for sit to stand with L UE on RW and R UE on arm rest of WC, and stand to sit with R UE on RW and L UE on arm rest.   Pt stood with RW  and performed R UE cross body reaching x10 on each side.  Pt stood with RW and CGA and clapped 2x10 with CGA, pt only abel to perform 1 clap at a time prior to requiring UE assist on RW.   Pt self propelled WC with BUE x150+ feet with B UE and supervision/light min A on uneven terrain outside (from reflection fountain to elevators by gift shop).   Education provided for donning/doffing leg rests, pt able to perform with supervision/min A.   Pt seated in recliner at end of session with all needs within reach and chair alarm on.   [facility] [place], [place], DPT  12/25/2023, 12:00 PM

## 2023-12-25 NOTE — Progress Notes (Signed)
 PROGRESS NOTE   Subjective/Complaints:  Pt doing well though she didn't sleep well last night d/t pain. Was able to get pain relief eventually, but overall sleep was poor. Pain controlled with meds though. Maybe just a timing issue.  LBM was Tuesday 5 days ago. Agreeable to plan of increasing bowel meds.  Urinating fine. Denies any other complaints or concerns today.   ROS: as per HPI. Denies CP, SOB, abd pain, N/V/D, or any other complaints at this time.    Objective:   No results found. Recent Labs    12/24/23 0518 12/24/23 1545  WBC 9.8 11.3*  HGB 11.1* 12.2  HCT 33.5* 36.6  PLT 256 333   Recent Labs    12/24/23 0518 12/24/23 1545  NA 141 141  K 3.9 4.0  CL 103 104  CO2 30 30  GLUCOSE 110* 96  BUN 22* 18  CREATININE 0.76 0.71  CALCIUM 9.1 9.1         Intake/Output Summary (Last 24 hours) at 12/25/2023 1133 Last data filed at 12/25/2023 0915 Gross per 24 hour  Intake 456 ml  Output --  Net 456 ml        Physical Exam: Vital Signs Blood pressure 107/64, pulse 77, temperature 98.4 F (36.9 C), temperature source Oral, resp. rate 18, height 5\' 4"  (1.626 m), weight 68.1 kg, last menstrual period 06/14/2011, SpO2 97%.  Constitution: Appropriate appearance for age. No apparent distress.  Sitting up in  bed talking to OT. Resp: No respiratory distress. No accessory muscle usage. on RA and CTAB Cardio: Well perfused appearance. No peripheral edema in LLE, unable to assess in RLE.  Brisk capillary refill bilaterally. RRR, no m/r/g appreciated Abdomen: Nondistended. Nontender. Hypoactive BS. Soft.   Psych: Appropriate mood and affect. MSK: Right lower extremity cast.  Full active range of motion all other extremities  PRIOR EXAMS: Neuro: AAOx4. No apparent cognitive deficits. Bilateral upper extremity intention tremor, left greater than right.  No ataxia.  Reflexes intact. Bilateral upper lower extremity  stocking glove pattern peripheral neuropathy. 5 out of 5 strength in bilateral upper extremities and left lower extremity; right lower extremity 4 out of 5 hip flexion, can wiggle toes antigravity and against resistance     Assessment/Plan: 1. Functional deficits which require 3+ hours per day of interdisciplinary therapy in a comprehensive inpatient rehab setting. Physiatrist is providing close team supervision and 24 hour management of active medical problems listed below. Physiatrist and rehab team continue to assess barriers to discharge/monitor patient progress toward functional and medical goals  Care Tool:  Bathing              Bathing assist       Upper Body Dressing/Undressing Upper body dressing        Upper body assist      Lower Body Dressing/Undressing Lower body dressing            Lower body assist       Toileting Toileting    Toileting assist       Transfers Chair/bed transfer  Transfers assist           Locomotion Ambulation   Ambulation assist  Walk 10 feet activity   Assist           Walk 50 feet activity   Assist           Walk 150 feet activity   Assist           Walk 10 feet on uneven surface  activity   Assist           Wheelchair     Assist               Wheelchair 50 feet with 2 turns activity    Assist            Wheelchair 150 feet activity     Assist          Blood pressure 107/64, pulse 77, temperature 98.4 F (36.9 C), temperature source Oral, resp. rate 18, height 5\' 4"  (1.626 m), weight 68.1 kg, last menstrual period 06/14/2011, SpO2 97%.  Medical Problem List and Plan: 1. Functional deficits secondary to right tibia fracture status post IM nail             -patient may not shower unless cast is totally covered             -ELOS/Goals: 7 to 10 days, modified independent PT/OT -CIR evals today   2.   Antithrombotics: -DVT/anticoagulation:  Pharmaceutical: Lovenox 40mg  daily             -antiplatelet therapy: N/a 3. Pain Management:  Oxycodone prn. Continue robaxin 500 mg QID and tylenol 650 mg qid prn.             --ice/heat prn -12/25/23 pain controlled but suggested she monitor the timing of doses particularly at night so she can try to improve her sleep a bit/have better pain control overnight; having spasms she says, may need to consider switching to other muscle relaxant but for now leave robaxin. Kpad ordered. 4. Mood/Behavior/Sleep: LCSW to follow for evaluation and support.              -antipsychotic agents: N/A  -Melatonin PRN  -Buspar 5mg  BID 5. Neuropsych/cognition: This patient is capable of making decisions on her own behalf. 6. Skin/Wound Care: Routine pressure relief measures.  7. Fluids/Electrolytes/Nutrition: Monitor I/O. Continue vitamins. Routine labs  -12/25/23 CMP yesterday basically unremarkable, Mg today WNL.  8. 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 B12 level WNL 432 on 3/30   9. Peripheral edema/HTN: Monitor BP TID. Continue Demadex 20mg  bid.   -12/25/23 BPs fine, monitor Vitals:   12/24/23 1446 12/24/23 2022 12/25/23 0502  BP: (!) 114/96 123/63 107/64    10. Anxiety d/o?: On buspar 5mg  BID 11. Hypokalemia: Will check daily and supplement aggressively (currently BID)  -12/25/23 K 4.0 yesterday, monitor 12. Hypomagnesia: Recheck in am and supplement which will help K+ also  -12/25/23 Mg 1.7 today, monitor 13. Peripheral neuropathy: Continue gabapentin 300mg  TID. --Was on high dose B12 PTA-->-check level in am> WNL 432 on 12/25/23 14. GERD: stable on Protonix 40mg  daily 15. Low normal Vitamin D level 38.13 on 3/27: Add ergocalciferol 50,000U weekly for supplement.  16.  Constipation.  Last bowel movement prior to admission.  Daily MiraLAX, Senokot-S 2 tabs twice daily, and as needed sorbitol started on  admission. -12/25/23 still no BM, added colace 100mg  daily, increased miralax to BID, Mg Citrate PRN if no BM later, monitor     LOS: 1 days A FACE TO FACE EVALUATION  WAS PERFORMED  [address] 12/25/2023, 11:33 AM

## 2023-12-25 NOTE — Care Management (Signed)
 Inpatient Rehabilitation Center Individual Statement of Services  Patient Name:  Annette Lucas  Date:  12/25/2023  Welcome to the Inpatient Rehabilitation Center.  Our goal is to provide you with an individualized program based on your diagnosis and situation, designed to meet your specific needs.  With this comprehensive rehabilitation program, you will be expected to participate in at least 3 hours of rehabilitation therapies Monday-Friday, with modified therapy programming on the weekends.  Your rehabilitation program will include the following services:  Physical Therapy (PT), Occupational Therapy (OT), 24 hour per day rehabilitation nursing, Therapeutic Recreaction (TR), Psychology, Neuropsychology, Care Coordinator, Rehabilitation Medicine, Nutrition Services, Pharmacy Services, and Other  Weekly team conferences will be held on Wednesdays to discuss your progress.  Your Inpatient Rehabilitation Care Coordinator will talk with you frequently to get your input and to update you on team discussions.  Team conferences with you and your family in attendance may also be held.  Expected length of stay: 7-10 days    Overall anticipated outcome: Independent with an Assistive Device  Depending on your progress and recovery, your program may change. Your Inpatient Rehabilitation Care Coordinator will coordinate services and will keep you informed of any changes. Your Inpatient Rehabilitation Care Coordinator's name and contact numbers are listed  below.  The following services may also be recommended but are not provided by the Inpatient Rehabilitation Center:  Driving Evaluations Home Health Rehabiltiation Services Outpatient Rehabilitation Services Vocational Rehabilitation   Arrangements will be made to provide these services after discharge if needed.  Arrangements include referral to agencies that provide these services.  Your insurance has been verified to be:  [employer] [place]  Your primary doctor is:  Lianne Moris  Pertinent information will be shared with your doctor and your insurance company.  Inpatient Rehabilitation Care Coordinator:  Susie Cassette [phone] or [phone]  Information discussed with and copy given to patient by: Gretchen Short, 12/25/2023, 7:06 PM

## 2023-12-25 NOTE — Progress Notes (Signed)
    Ranelle Oyster, MD  Physician Physical Medicine and Rehabilitation   Progress Notes     Signed   Date of Service: 12/23/2023 10:39 AM  Related encounter: ED to Hosp-Admission (Discharged) from 12/20/2023 in [facility]   Signed      Show:Clear all [x] Written[x] Templated[] Copied  Added by: [x] Ranelle Oyster, MD  [] Hover for details Physical Medicine & Rehabilitation Consult Service   Pt discussed with rehab admissions coordinator. Chart has been reviewed. 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 Pt's course complicated by post-op pain, persistent leukocytosis, significant hypokalemia. Vitamin D levels low also. She is NWB RLE. Pt is currently min assist for transfers and pre-gait activities. Was independent PTA         Assessment: Right tibia fx s/p I&D  and IMN Hx of peripheral neuropathy     Plan:   This patient would benefit from acute inpatient rehab to address functional mobility and self-care in the setting of her weight bearing precautions, . Additionally, the patient requires daily MD oversight of the active medical issues noted above. Projected goals would be mod I with mobility and self-care. Dispo and social supports are appropriate as family can provide intermittent assist at home.      Rehab Admissions Coordinator to follow up.     Ranelle Oyster, MD, [facility] Physical Medicine & Rehabilitation Medical Director Rehabilitation Services 12/23/2023

## 2023-12-25 NOTE — Plan of Care (Signed)
 Problem: RH Balance Goal: LTG Patient will maintain dynamic sitting balance (PT) Description: LTG:  Patient will maintain dynamic sitting balance with assistance during mobility activities (PT) Flowsheets (Taken 12/25/2023 1608) LTG: Pt will maintain dynamic sitting balance during mobility activities with:: Independent with assistive device  Goal: LTG Patient will maintain dynamic standing balance (PT) Description: LTG:  Patient will maintain dynamic standing balance with assistance during mobility activities (PT) Flowsheets (Taken 12/25/2023 1608) LTG: Pt will maintain dynamic standing balance during mobility activities with:: Independent with assistive device    Problem: Sit to Stand Goal: LTG:  Patient will perform sit to stand with assistance level (PT) Description: LTG:  Patient will perform sit to stand with assistance level (PT) Flowsheets (Taken 12/25/2023 1608) LTG: PT will perform sit to stand in preparation for functional mobility with assistance level: Independent with assistive device   Problem: RH Bed Mobility Goal: LTG Patient will perform bed mobility with assist (PT) Description: LTG: Patient will perform bed mobility with assistance, with/without cues (PT). Flowsheets (Taken 12/25/2023 1608) LTG: Pt will perform bed mobility with assistance level of: Independent   Problem: RH Bed to Chair Transfers Goal: LTG Patient will perform bed/chair transfers w/assist (PT) Description: LTG: Patient will perform bed to chair transfers with assistance (PT). Flowsheets (Taken 12/25/2023 1608) LTG: Pt will perform Bed to Chair Transfers with assistance level: Independent with assistive device    Problem: RH Car Transfers Goal: LTG Patient will perform car transfers with assist (PT) Description: LTG: Patient will perform car transfers with assistance (PT). Flowsheets (Taken 12/25/2023 1608) LTG: Pt will perform car transfers with assist:: Independent with assistive device    Problem:  RH Furniture Transfers Goal: LTG Patient will perform furniture transfers w/assist (OT/PT) Description: LTG: Patient will perform furniture transfers  with assistance (OT/PT). Flowsheets (Taken 12/25/2023 1608) LTG: Pt will perform furniture transfers with assist:: Independent with assistive device    Problem: RH Ambulation Goal: LTG Patient will ambulate in controlled environment (PT) Description: LTG: Patient will ambulate in a controlled environment, # of feet with assistance (PT). Flowsheets (Taken 12/25/2023 1608) LTG: Pt will ambulate in controlled environ  assist needed:: Independent with assistive device LTG: Ambulation distance in controlled environment: 25 feet with LRAD Goal: LTG Patient will ambulate in home environment (PT) Description: LTG: Patient will ambulate in home environment, # of feet with assistance (PT). Flowsheets (Taken 12/25/2023 1608) LTG: Pt will ambulate in home environ  assist needed:: Independent with assistive device LTG: Ambulation distance in home environment: 10 feet with LRAD   Problem: RH Wheelchair Mobility Goal: LTG Patient will propel w/c in home environment (PT) Description: LTG: Patient will propel wheelchair in home environment, # of feet with assistance (PT). Flowsheets (Taken 12/25/2023 1608) LTG: Pt will propel w/c in home environ  assist needed:: Independent with assistive device Distance: wheelchair distance in controlled environment: 75 Goal: LTG Patient will propel w/c in community environment (PT) Description: LTG: Patient will propel wheelchair in community environment, # of feet with assist (PT) Flowsheets (Taken 12/25/2023 1608) LTG: Pt will propel w/c in community environ  assist needed:: Independent with assistive device [id number] w/c distance in community environment: 150 feet with LRAD   Problem: RH Stairs Goal: LTG Patient will ambulate up and down stairs w/assist (PT) Description: LTG: Patient will ambulate up and down # of  stairs with assistance (PT) Flowsheets (Taken 12/25/2023 1608) LTG: Pt will ambulate up/down stairs assist needed:: Contact Guard/Touching assist LTG: Pt will  ambulate up and down number of  stairs: 2 steps with LRAD for home entry

## 2023-12-25 NOTE — Evaluation (Signed)
 Occupational Therapy Assessment and Plan  Patient Details  Name: Annette Lucas MRN: [id number] Date of Birth: 05/09/1971  OT Diagnosis: muscle weakness (generalized) Rehab Potential:   ELOS:     Today's Date: 12/25/2023 OT Individual Time: 4098-1191 OT Individual Time Calculation (min): 75 min     Hospital Problem: Principal Problem:   Tibia/fibula fracture, right, sequela   Past Medical History:  Past Medical History:  Diagnosis Date   Allergy to meat    GERD (gastroesophageal reflux disease)    Hypertension    IBS (irritable bowel syndrome)    Neuropathy    Stroke [facility])    Past Surgical History:  Past Surgical History:  Procedure Laterality Date   CYSTOSCOPY  07/06/2011   Procedure: CYSTOSCOPY;  Surgeon: Zenaida Niece, MD;  Location: [facility];  Service: Gynecology;  Laterality: N/A;   ENDOMETRIAL ABLATION     EXTERNAL FIXATION LEG Right 12/21/2023   Procedure: EXTERNAL FIXATION, LOWER EXTREMITY;  Surgeon: Roby Lofts, MD;  Location: [facility] OR;  Service: Orthopedics;  Laterality: Right;  zimmer large c arm wound vac with prevena dressing handy bed   INCISION AND DRAINAGE OF WOUND Right 12/21/2023   Procedure: IRRIGATION AND DEBRIDEMENT WOUND;  Surgeon: Roby Lofts, MD;  Location: [facility] OR;  Service: Orthopedics;  Laterality: Right;   LAPAROSCOPIC SALPINGO OOPHERECTOMY Bilateral 09/01/2016   Procedure: LAPAROSCOPIC SALPINGO OOPHORECTOMY;  Surgeon: Lavina Hamman, MD;  Location: [facility]S;  Service: Gynecology;  Laterality: Bilateral;   VAGINAL HYSTERECTOMY  07/06/2011   Procedure: HYSTERECTOMY VAGINAL;  Surgeon: Zenaida Niece, MD;  Location: [facility] OR[facility];  Service: Gynecology;  Laterality: N/A;    Assessment & Plan Clinical Impression: Annette Lucas is a 53 year old female with history of HTN, IBS, ETOH use in the past, neuropathy, recent stroke who slipped and fell while cleaning the bathroom and struck striking her foot against the doorframe with onset of ankle pain with  inability to bear weight as well as large open wound. She was found to have open distal right tib fib fracture. Dr. Jena Gauss consulted due to complexity of wound and patient underwent I and D with Im nailing on 03/26. Post op to be NWB RLE and on Lovenox for DVT prophylaxis. Vitamin D level 38.13 and low normal.  She was hypokalemic at admission with K-3.1  which improved with brief supplementation but down to 2.9 today. Mg 1.6 at admission.     PT/OT has worked with patient who requires min assist +2 for safety with transfers and ADLs. PTA- patient lives alone, works out of home and was independent PTA. CIR recommended due to functional decline.  Patient transferred to CIR on 12/24/2023 .    Patient currently requires min with basic self-care skills secondary to muscle weakness, decreased cardiorespiratoy endurance, and decreased standing balance and decreased balance strategies.  Prior to hospitalization, patient could complete ADLs with modified independent .  Patient will benefit from skilled intervention to decrease level of assist with basic self-care skills, increase independence with basic self-care skills, and increase level of independence with iADL prior to discharge home independently.  Anticipate patient will require intermittent supervision and follow up outpatient.      OT Evaluation Precautions/Restrictions  Precautions Precautions: Fall Required Braces or Orthoses: Splint/Cast Splint/Cast: RLE Restrictions Weight Bearing Restrictions Per Provider Order: Yes RLE Weight Bearing Per Provider Order: Non weight bearing General Chart Reviewed: Yes Response to Previous Treatment: Not applicable Family/Caregiver Present: No Vital Signs Therapy Vitals Temp: 98.4 F (36.9 C)  Temp Source: Oral Pulse Rate: 77 Resp: 18 BP: 107/64 Patient Position (if appropriate): Lying Oxygen Therapy SpO2: 97 % O2 Device: Room Air Pain Pain Assessment Pain Scale: 0-10 Pain Score: 10-Worst  pain ever Pain Type: Acute pain;Surgical pain Pain Location: Knee Pain Orientation: Right Pain Descriptors / Indicators: Squeezing;Throbbing Pain Onset: On-going Pain Intervention(s): Medication (See eMAR);Emotional support Home Living/Prior Functioning Home Living Family/patient expects to be discharged to:: Private residence Living Arrangements: Alone Available Help at Discharge: Available PRN/intermittently (family lives near her) Type of Home: House Home Access: Stairs to enter [employer] of Steps: 2 going into back door Entrance Stairs-Rails: Right Home Layout: One level Bathroom Shower/Tub: Psychologist, counselling, Engineer, building services: Administrator Accessibility: Yes Additional Comments: Not driving friends/family to drive to appointments  Lives With: Alone IADL History Homemaking Responsibilities: Yes Meal Prep Responsibility: Primary Laundry Responsibility: Primary Cleaning Responsibility: Primary Bill Paying/Finance Responsibility: Primary Shopping Responsibility: Primary Child Care Responsibility:  (child in college) Current License: No Occupation: Full time employment 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 Leisure and Hobbies: exercising and garden Prior Function Level of Independence: Independent with basic ADLs, Independent with gait, Independent with transfers, Independent with homemaking with ambulation  Able to Take Stairs?: Yes Driving: No Vocation: Full time employment Leisure: Hobbies-yes (Comment) Vision Baseline Vision/History: 1 Wears glasses (for distance) Ability to See in Adequate Light: 0 Adequate Patient Visual Report: No change from baseline Vision Assessment?: No apparent visual deficits Perception  Perception: Within Functional Limits Praxis Praxis: WFL Cognition Cognition Overall Cognitive Status: Within Functional Limits for tasks assessed Arousal/Alertness: Awake/alert Orientation Level:  Person;Place;Situation Person: Oriented Place: Oriented Situation: Oriented Memory: Appears intact Awareness: Appears intact Problem Solving: Appears intact Safety/Judgment: Appears intact Brief Interview for Mental Status (BIMS) Repetition of Three Words (First Attempt): 3 Temporal Orientation: Year: Correct Temporal Orientation: Month: Accurate within 5 days Temporal Orientation: Day: Correct Recall: "Sock": Yes, no cue required Recall: "Blue": Yes, no cue required Recall: "Bed": Yes, after cueing ("a piece of furniture") BIMS Summary Score: 14 Sensation Sensation Light Touch: Appears Intact Additional Comments: baseline neuropathy in hands and feet Coordination Gross Motor Movements are Fluid and Coordinated: No Fine Motor Movements are Fluid and Coordinated: Yes Coordination and Movement Description: RLE NWB Motor  Motor Motor: Other (comment) Motor - Skilled Clinical Observations: generalized weakness and NWB RLE  Trunk/Postural Assessment  Cervical Assessment Cervical Assessment: Within Functional Limits Thoracic Assessment Thoracic Assessment: Within Functional Limits Lumbar Assessment Lumbar Assessment: Within Functional Limits Postural Control Postural Control: Deficits on evaluation (delayed)  Balance Balance Balance Assessed: Yes Static Sitting Balance Static Sitting - Balance Support: No upper extremity supported Static Sitting - Level of Assistance: 5: Stand by assistance Static Standing Balance Static Standing - Balance Support: Bilateral upper extremity supported;During functional activity Static Standing - Level of Assistance: 5: Stand by assistance (CGA) Dynamic Standing Balance Dynamic Standing - Balance Support: Bilateral upper extremity supported;During functional activity Dynamic Standing - Level of Assistance: 4: Min assist Extremity/Trunk Assessment RUE Assessment RUE Assessment: Within Functional Limits LUE Assessment LUE Assessment:  Within Functional Limits  Care Tool Care Tool Self Care Eating   Eating Assist Level: Set up assist    Oral Care    Oral Care Assist Level: Supervision/Verbal cueing    Bathing         Assist Level: Minimal Assistance - Patient > 75%    Upper Body Dressing(including orthotics)       Assist Level: Supervision/Verbal cueing    Lower Body Dressing (excluding  footwear)     Assist for lower body dressing: Minimal Assistance - Patient > 75%    Putting on/Taking off footwear     Assist for footwear: Total Assistance - Patient < 25%       Care Tool Toileting Toileting activity   Assist for toileting: Moderate Assistance - Patient 50 - 74%     Care Tool Bed Mobility Roll left and right activity   Roll left and right assist level: Supervision/Verbal cueing    Sit to lying activity   Sit to lying assist level: Supervision/Verbal cueing    Lying to sitting on side of bed activity   Lying to sitting on side of bed assist level: the ability to move from lying on the back to sitting on the side of the bed with no back support.: Supervision/Verbal cueing     Care Tool Transfers Sit to stand transfer   Sit to stand assist level: Contact Guard/Touching assist    Chair/bed transfer   Chair/bed transfer assist level: Contact Guard/Touching assist     Toilet transfer   Assist Level: Contact Guard/Touching assist     Care Tool Cognition  Expression of Ideas and Wants Expression of Ideas and Wants: 4. Without difficulty (complex and basic) - expresses complex messages without difficulty and with speech that is clear and easy to understand  Understanding Verbal and Non-Verbal Content Understanding Verbal and Non-Verbal Content: 4. Understands (complex and basic) - clear comprehension without cues or repetitions   Memory/Recall Ability Memory/Recall Ability : Current season;That he or she is in a hospital/hospital unit   Refer to Care Plan for Long Term Goals  SHORT TERM GOAL  WEEK 1 OT Short Term Goal 1 (Week 1): STG=LTG d/t ELOS  Recommendations for other services: Therapeutic Recreation  Pet therapy   Skilled Therapeutic Intervention ADL ADL Equipment Provided: Reacher Eating: Set up Where Assessed-Eating: Bed level Grooming: Supervision/safety Where Assessed-Grooming: Sitting at sink Upper Body Bathing: Supervision/safety Where Assessed-Upper Body Bathing: Sitting at sink Lower Body Bathing: Minimal assistance Where Assessed-Lower Body Bathing: Sitting at sink Upper Body Dressing: Supervision/safety Where Assessed-Upper Body Dressing: Wheelchair Lower Body Dressing: Minimal assistance;Moderate assistance (reacher) Where Assessed-Lower Body Dressing: Sitting at sink;Standing at sink Toileting: Minimal assistance;Moderate assistance Where Assessed-Toileting: Toilet;Bedside Commode Toilet Transfer: Furniture conservator/restorer Method: Stand pivot Acupuncturist: Gaffer Method: Stand pivot Tub/Shower Equipment: Transfer tub bench;Walk in Music therapist: Administrator, arts Method: Warden/ranger: Transfer tub bench;Grab bars ADL Comments: Pt benefits from AE for increased independence with LB dressing. Pt having trouble with taking one hand off to manage pants over waist furing LB dressing dt decreased balance strategies. Mobility  Bed Mobility Bed Mobility: Supine to Sit;Sit to Supine;Rolling Left;Rolling Right Rolling Right: Supervision/verbal cueing Rolling Left: Supervision/Verbal cueing Supine to Sit: Supervision/Verbal cueing Sit to Supine: Supervision/Verbal cueing Transfers Sit to Stand: Contact Guard/Touching assist Stand to Sit: Contact Guard/Touching assist  1:1 evaluation and treatment session initiated this date. OT roles, goals and purpose discussed with pt as well as therapy schedule. ADL completed this date with levels of assist listed  above. Pt would benefit from skilled OT in IPR setting in order to maximize independence with ADLs upon D/C.    Discharge Criteria: Patient will be discharged from OT if patient refuses treatment 3 consecutive times without medical reason, if treatment goals not met, if there is a change in medical status, if patient makes no progress towards goals  or if patient is discharged from hospital.  The above assessment, treatment plan, treatment alternatives and goals were discussed and mutually agreed upon: by patient  Velia Meyer, OTD, OTR/L 12/25/2023, 12:49 PM

## 2023-12-25 NOTE — Plan of Care (Signed)
  Problem: RH Balance Goal: LTG Patient will maintain dynamic standing with ADLs (OT) Description: LTG:  Patient will maintain dynamic standing balance with assist during activities of daily living (OT)  Flowsheets (Taken 12/25/2023 1238) LTG: Pt will maintain dynamic standing balance during ADLs with: Independent with assistive device   Problem: RH Grooming Goal: LTG Patient will perform grooming w/assist,cues/equip (OT) Description: LTG: Patient will perform grooming with assist, with/without cues using equipment (OT) Flowsheets (Taken 12/25/2023 1238) LTG: Pt will perform grooming with assistance level of: Independent with assistive device    Problem: RH Bathing Goal: LTG Patient will bathe all body parts with assist levels (OT) Description: LTG: Patient will bathe all body parts with assist levels (OT) Flowsheets (Taken 12/25/2023 1238) LTG: Pt will perform bathing with assistance level/cueing: Independent with assistive device    Problem: RH Dressing Goal: LTG Patient will perform upper body dressing (OT) Description: LTG Patient will perform upper body dressing with assist, with/without cues (OT). Flowsheets (Taken 12/25/2023 1238) LTG: Pt will perform upper body dressing with assistance level of: Independent with assistive device Goal: LTG Patient will perform lower body dressing w/assist (OT) Description: LTG: Patient will perform lower body dressing with assist, with/without cues in positioning using equipment (OT) Flowsheets (Taken 12/25/2023 1238) LTG: Pt will perform lower body dressing with assistance level of: Independent with assistive device   Problem: RH Toileting Goal: LTG Patient will perform toileting task (3/3 steps) with assistance level (OT) Description: LTG: Patient will perform toileting task (3/3 steps) with assistance level (OT)  Flowsheets (Taken 12/25/2023 1238) LTG: Pt will perform toileting task (3/3 steps) with assistance level: Independent with assistive  device   Problem: RH Light Housekeeping Goal: LTG Patient will perform light housekeeping w/assist (OT) Description: LTG: Patient will perform light housekeeping with assistance, with/without cues (OT). Flowsheets (Taken 12/25/2023 1238) LTG: Pt will perform light housekeeping with assistance level of: Independent with assistive device   Problem: RH Toilet Transfers Goal: LTG Patient will perform toilet transfers w/assist (OT) Description: LTG: Patient will perform toilet transfers with assist, with/without cues using equipment (OT) Flowsheets (Taken 12/25/2023 1238) LTG: Pt will perform toilet transfers with assistance level of: Independent with assistive device   Problem: RH Tub/Shower Transfers Goal: LTG Patient will perform tub/shower transfers w/assist (OT) Description: LTG: Patient will perform tub/shower transfers with assist, with/without cues using equipment (OT) Flowsheets (Taken 12/25/2023 1238) LTG: Pt will perform tub/shower stall transfers with assistance level of: Independent with assistive device

## 2023-12-26 DIAGNOSIS — S82201S Unspecified fracture of shaft of right tibia, sequela: Secondary | ICD-10-CM | POA: Diagnosis not present

## 2023-12-26 DIAGNOSIS — I10 Essential (primary) hypertension: Secondary | ICD-10-CM | POA: Diagnosis not present

## 2023-12-26 DIAGNOSIS — E876 Hypokalemia: Secondary | ICD-10-CM | POA: Diagnosis not present

## 2023-12-26 DIAGNOSIS — K5900 Constipation, unspecified: Secondary | ICD-10-CM | POA: Diagnosis not present

## 2023-12-26 DIAGNOSIS — M79604 Pain in right leg: Secondary | ICD-10-CM

## 2023-12-26 LAB — BASIC METABOLIC PANEL WITH GFR
Anion gap: 8 (ref 5–15)
BUN: 16 mg/dL (ref 6–20)
CO2: 30 mmol/L (ref 22–32)
Calcium: 9.4 mg/dL (ref 8.9–10.3)
Chloride: 99 mmol/L (ref 98–111)
Creatinine, Ser: 0.71 mg/dL (ref 0.44–1.00)
GFR, Estimated: >60 mL/min (ref >60)
Glucose, Bld: 117 mg/dL — ABNORMAL HIGH (ref 70–99)
Potassium: 3.9 mmol/L (ref 3.5–5.1)
Sodium: 137 mmol/L (ref 135–145)

## 2023-12-26 LAB — CBC
HCT: 36.6 % (ref 36.0–46.0)
Hemoglobin: 12.3 g/dL (ref 12.0–15.0)
MCH: 33.3 pg (ref 26.0–34.0)
MCHC: 33.6 g/dL (ref 30.0–36.0)
MCV: 99.2 fL (ref 80.0–100.0)
Platelets: 322 10*3/uL (ref 150–400)
RBC: 3.69 MIL/uL — ABNORMAL LOW (ref 3.87–5.11)
RDW: 14.9 % (ref 11.5–15.5)
WBC: 12 10*3/uL — ABNORMAL HIGH (ref 4.0–10.5)
nRBC: 0 % (ref 0.0–0.2)

## 2023-12-26 NOTE — Progress Notes (Signed)
 Inpatient Rehabilitation  Patient information reviewed and entered into eRehab system by [facility]. Karen Kays., CCC/SLP, PPS Coordinator.  Information including medical coding, functional ability and quality indicators will be reviewed and updated through discharge.

## 2023-12-26 NOTE — Progress Notes (Signed)
 Physical Therapy Session Note  Patient Details  Name: Annette Lucas MRN: [id number] Date of Birth: 09/21/1971  Today's Date: 12/26/2023 PT Individual Time: 5284-1324 and 1300-1410 PT Individual Time Calculation (min): 71 min and 70 min  Short Term Goals: Week 1:  PT Short Term Goal 1 (Week 1): STG=LTG 2/2 ELOS  Skilled Therapeutic Interventions/Progress Updates:   Treatment Session 1 Received pt semi-reclined in bed, pt agreeable to PT treatment, and reported pain 7/10 in R knee radiating down to ankle (predicated). Session with emphasis on functional mobility/transfers, toileting, generalized strengthening and endurance, dynamic standing balance/coordination, stair navigation, and gait training. Pt transferred supine<>sitting EOB with HOB elevated and use of bedrails with supervision. Doffed non-skid sock and donned regular sock with supervision but required max A to slip on L tennis shoe. Pt performed all transfers with RW and CGA throughout session. Pt reported urge to toilet and transferred onto bedside commode and required total A for clothing management. With increased time pt able to void and performed pericare without assist. Transferred into WC and sat in WC at sink and washed hands, groomed, and brushed teeth mod I.  Pt performed WC mobility 132ft using BUE and supervision to main therapy gym with emphasis on BUE strength and endurance. Pt reports having 2 STE with R handrail. Transferred onto shower chair and navigated 2 6in steps with R handrail and CGA using shower chair technique. Pt able to move shower chair up the steps with min A but dependent to move chair down the steps - need to continue practicing, although pt reports her parents would be able to assist with this. Transferred on/off Nustep with RW and CGA and performed seated BUE and LLE strengthening on Nustep at workload 4 for 8 minutes with steps per minute >55 for a total of 466 steps with emphasis on cardiovascular endurance  with RLE supported. Pt required 1 rest/water break during activity. Pt then ambulated 42ft with RW and CGA using a "hop to" pattern with good adherence to RLE NWB precautions. Pt performed standing single leg mini squats 2x8 with emphasis on quad strength. Transported back to room in [facility] dependently and concluded session with pt sitting in [facility] with all needs within reach. Provided pt with ice pack for R knee.   Treatment Session 2 Received pt sitting in WC, pt agreeable to PT treatment, and reported pain 7/10 in R knee/ankle (premedicated). Session with emphasis on functional mobility/transfers, generalized strengthening and endurance, dynamic standing balance/coordination, and gait training. Pt performed all transfers with RW and CGA throughout session.   Pt performed WC mobility >368ft x 2 trials using BUE and supervision to dayroom with 2 rest breaks halfway through due to BUE fatigue. Stood from [facility] and ambulated 20ft with RW and CGA to mat, demonstrating good adherence to RLE NWB precautions. Transferred into supine and performed the following exercises with emphasis on LE strength/ROM: -SLR 2x8 AAROM on RLE and 2x10 with 2.5lb ankle weight on LLE -LLE marches with 2.5lb ankle weight 1x10 decreasing to 1.5lb ankle weight x10 -hip abduction 2x10 bilaterally (AAROM on RLE and with 1.5lb ankle weight on LLE) -LLE hamstring stretch 3x30 second hold -hip adduction ball squeezes 2x10 with 3 second isometric hold -L single leg bridges 2x8 Pt transferred supine<>sitting EOM with supervision and transitioned to the following standing exercises with BUE support on RW and CGA for balance: -R hip flexion 2x10 (decreased knee flexion and limited by pain) -LLE heel raises 2x10 -R hip extension 2x10 Pt then ambulated  additional 50ft with RW and CGA using a "hop to" pattern - cues for soft landing and landing on R heel. Returned to room and transferred into recliner. Concluded session with pt sitting in recliner  with all needs within reach. Provided pt with ice pack for R knee and elevated RLE for pain management and edema control.   Therapy Documentation Precautions:  Precautions Precautions: Fall Recall of Precautions/Restrictions: Intact Required Braces or Orthoses: Splint/Cast Splint/Cast: RLE Restrictions Weight Bearing Restrictions Per Provider Order: Yes RLE Weight Bearing Per Provider Order: Non weight bearing  Therapy/Group: Individual Therapy Marlana Salvage Zaunegger Blima Rich PT, DPT 12/26/2023, 6:52 AM

## 2023-12-26 NOTE — Progress Notes (Signed)
 Occupational Therapy Session Note  Patient Details  Name: Annette Lucas MRN: [id number] Date of Birth: Jun 02, 1971  Today's Date: 12/26/2023 OT Individual Time: 1107-1200 OT Individual Time Calculation (min): 53 min    Short Term Goals: Week 1:  OT Short Term Goal 1 (Week 1): STG=LTG d/t ELOS  Skilled Therapeutic Interventions/Progress Updates:  Skilled OT intervention completed with focus on ADL retraining, functional transfers, dynamic standing balance. Pt received seated in w/c, agreeable to session. No verbalized pain reported.  Offered pt full shower however pt slightly apprehensive about the process requesting to sponge bathe and discuss shower set up and do it at later date. Education provided. Pt was able to self-propel in w/c throughout session with mod I, however did need cues but no assist for w/c leg rest management.   Seated at sink pt was set up A for UB bathing/dressing, supervision for threading of pants with reacher however CGA for sit > stand with RW and intermittent min A for dynamic standing balance using RW. Good NWB adherence on RLE during session. Set up A for donning of sock/shoe but min A for tying. Discussed modifications for shoes with laces due to peripheral neuropathy that is present in hands affecting sensation and FMC.  In gym, pt completed CGA sit > stand and stand pivot with RW <> EOM. Pt participated in the following dynamic standing balance and endurance tasks to promote independence and safety during BADLs and functional mobility: -placing and retrieving squigz from long mirror. CGA sit > stand with RW, and CGA needed for balance but increased challenge and stability when releasing RUE from RW during task  Back in room pt remained seated in w/c, with chair alarm on/activated, and with all needs in reach at end of session.   Therapy Documentation Precautions:  Precautions Precautions: Fall Recall of Precautions/Restrictions: Intact Required Braces or  Orthoses: Splint/Cast Splint/Cast: RLE Restrictions Weight Bearing Restrictions Per Provider Order: Yes RLE Weight Bearing Per Provider Order: Non weight bearing    Therapy/Group: Individual Therapy  Melvyn Novas, MS, OTR/L  12/26/2023, 12:11 PM

## 2023-12-26 NOTE — Progress Notes (Addendum)
 Inpatient Rehabilitation Care Coordinator Assessment and Plan Patient Details  Name: Annette Lucas MRN: [id number] Date of Birth: [birth year]  Today's Date: 12/26/2023  Hospital Problems: Principal Problem:   Tibia/fibula fracture, right, sequela  Past Medical History:  Past Medical History:  Diagnosis Date   Allergy to meat    GERD (gastroesophageal reflux disease)    Hypertension    IBS (irritable bowel syndrome)    Neuropathy    Stroke [facility])    Past Surgical History:  Past Surgical History:  Procedure Laterality Date   CYSTOSCOPY  07/06/2011   Procedure: CYSTOSCOPY;  Surgeon: Zenaida Niece, MD;  Location: [facility] ORS;  Service: Gynecology;  Laterality: N/A;   ENDOMETRIAL ABLATION     EXTERNAL FIXATION LEG Right 12/21/2023   Procedure: EXTERNAL FIXATION, LOWER EXTREMITY;  Surgeon: Roby Lofts, MD;  Location: [facility] OR;  Service: Orthopedics;  Laterality: Right;  zimmer large c arm wound vac with prevena dressing handy bed   INCISION AND DRAINAGE OF WOUND Right 12/21/2023   Procedure: IRRIGATION AND DEBRIDEMENT WOUND;  Surgeon: Roby Lofts, MD;  Location: [facility] OR;  Service: Orthopedics;  Laterality: Right;   LAPAROSCOPIC SALPINGO OOPHERECTOMY Bilateral 09/01/2016   Procedure: LAPAROSCOPIC SALPINGO OOPHORECTOMY;  Surgeon: Lavina Hamman, MD;  Location: [facility] ORS;  Service: Gynecology;  Laterality: Bilateral;   VAGINAL HYSTERECTOMY  07/06/2011   Procedure: HYSTERECTOMY VAGINAL;  Surgeon: Zenaida Niece, MD;  Location: [facility] ORS;  Service: Gynecology;  Laterality: N/A;   Social History:  reports that she has never smoked. She has never used smokeless tobacco. She reports that she does not currently use alcohol. She reports that she does not use drugs.  Family / Support Systems Marital Status: Divorced How Long?: 5 years Patient Roles: Parent Spouse/Significant Other: Divorced Children: 1 adult dtr- Annette Lucas Environmental consultant Other Supports: PRN support from parents Anticipated Caregiver:  N/A Ability/Limitations of Caregiver: Pt will discharge tohome with intermittent support from parents that lives 'up the road.' Dtr will return form college in two weeks to provide assistance. Caregiver Availability: Intermittent Family Dynamics: Pt lives alone  Social History Preferred language: English Religion: Non-Denominational Cultural Background: Pt has been working as a Community education officer workerd for [employer]; pt is not a Cytogeneticist. Education: 2 yrs college Health Literacy - How often do you need to have someone help you when you read instructions, pamphlets, or other written material from your doctor or pharmacy?: Never Writes: Yes Employment Status: Employed Name of Employer: contract- medical billing Return to Work Plans: TBD; no benefits offered from employer since Engineering geologist Issues: Denies Guardian/Conservator: N/A   Abuse/Neglect Abuse/Neglect Assessment Can Be Completed: Yes Physical Abuse: Denies Verbal Abuse: Denies Sexual Abuse: Denies Exploitation of patient/patient's resources: Denies Self-Neglect: Denies  Patient response to: Social Isolation - How often do you feel lonely or isolated from those around you?: Never  Emotional Status Pt's affect, behavior and adjustment status: Pt in good spirits at time of visit Recent Psychosocial Issues: Deines Psychiatric History: Pt reports that she sees a therapist- Marga Melnick with [facility] in [employer] every 3 weeks. Substance Abuse History: reports only social etoh use. Denies tobacco products or rec drug use  Patient / Family Perceptions, Expectations & Goals Pt/Family understanding of illness & functional limitations: Pt has general understanding of care needs Premorbid pt/family roles/activities: INdependent Anticipated changes in roles/activities/participation: Assistance with ADLs.IADLs Pt/family expectations/goals: pt goal is to work on daily ADLs/IADLs including self  care; transfers; and getting in/out of home  Community [employer] Agencies: None Premorbid Home Care/DME Agencies: None Transportation available at discharge: TBD. Pt reports it will either be her dtr or parents. Prefers dtrs car since it sits up higher ([place] vs parents older model [place]). Is the patient able to respond to transportation needs?: Yes In the past 12 months, has lack of transportation kept you from medical appointments or from getting medications?: No In the past 12 months, has lack of transportation kept you from meetings, work, or from getting things needed for daily living?: No Resource referrals recommended: Neuropsychology  Discharge Planning Living Arrangements: Alone Support Systems: Children, Parent Type of Residence: Private residence Insurance Resources: Media planner (specify) ([place] [employer] [place]) Financial Resources: Employment Financial Screen Referred: No Living Expenses: Psychologist, sport and exercise Management: Patient Does the patient have any problems obtaining your medications?: No Home Management: Pt managed all home care needs Patient/Family Preliminary Plans: TBD Care Coordinator Barriers to Discharge: Decreased caregiver support, Insurance for SNF coverage, Lack of/limited family support Care Coordinator Anticipated Follow Up Needs: HH/OP Expected length of stay: 7-10 days  Clinical Impression SW met with pt in room to introduce self, explain role, and discuss discharge process. Pt is not a Cytogeneticist. No formal HCPOA; reports dtr Annette Lucas knows healthcare wishes. DME: w/c but needs elevating leg rests, 3in1 BSC, shower chair and TTB.   Devonna Oboyle A Jayona Mccaig 12/26/2023, 11:48 AM

## 2023-12-26 NOTE — Progress Notes (Signed)
 Patient ID: Annette Lucas, female   DOB: 1971-01-02, 53 y.o.   MRN: [id number] Met with the patient to review current medical situation, rehab process, team conference and plan of care. Discussed medications and dietary modification recommendations. Reports constipation addressed and pain is managed with current regimen. Continue to follow along to address educational needs to facilitate preparation for discharge. Pamelia Hoit, RN

## 2023-12-26 NOTE — Progress Notes (Signed)
 PROGRESS NOTE   Subjective/Complaints: Patient reports pain is doing better today, controlled with medications.  Slept better last night.  Had a bowel movement today.  No additional complaints or concerns.   ROS: as per HPI. Denies fevers, CP, SOB, abd pain, N/V/D, or any other complaints at this time.    Objective:   No results found. Recent Labs    12/24/23 1545 12/26/23 0512  WBC 11.3* 12.0*  HGB 12.2 12.3  HCT 36.6 36.6  PLT 333 322   Recent Labs    12/24/23 1545 12/26/23 0512  NA 141 137  K 4.0 3.9  CL 104 99  CO2 30 30  GLUCOSE 96 117*  BUN 18 16  CREATININE 0.71 0.71  CALCIUM 9.1 9.4         Intake/Output Summary (Last 24 hours) at 12/26/2023 1737 Last data filed at 12/26/2023 0836 Gross per 24 hour  Intake 438 ml  Output --  Net 438 ml        Physical Exam: Vital Signs Blood pressure 135/72, pulse 94, temperature 98 F (36.7 C), temperature source Oral, resp. rate 19, height 5\' 4"  (1.626 m), weight 68.1 kg, last menstrual period 06/14/2011, SpO2 99%.  Constitution: Appropriate appearance for age. No apparent distress.  Laying in bed Resp: No respiratory distress. No accessory muscle usage. on RA and CTAB Cardio: Well perfused appearance. No peripheral edema in LLE, unable to assess in RLE.  Brisk capillary refill bilaterally. RRR, no m/r/g appreciated Abdomen: Soft, nontender, nondistended, positive bowel sounds Psych: Appropriate mood and affect. MSK: Right lower extremity cast.  Full active range of motion all other extremities  PRIOR EXAMS: Neuro: AAOx4. No apparent cognitive deficits. Bilateral upper extremity intention tremor, left greater than right.  No ataxia.  Reflexes intact. Bilateral upper lower extremity stocking glove pattern peripheral neuropathy. 5 out of 5 strength in bilateral upper extremities and left lower extremity; right lower extremity 4 out of 5 hip flexion, can  wiggle toes antigravity and against resistance     Assessment/Plan: 1. Functional deficits which require 3+ hours per day of interdisciplinary therapy in a comprehensive inpatient rehab setting. Physiatrist is providing close team supervision and 24 hour management of active medical problems listed below. Physiatrist and rehab team continue to assess barriers to discharge/monitor patient progress toward functional and medical goals  Care Tool:  Bathing              Bathing assist Assist Level: Minimal Assistance - Patient > 75%     Upper Body Dressing/Undressing Upper body dressing   What is the patient wearing?: Pull over shirt    Upper body assist Assist Level: Supervision/Verbal cueing    Lower Body Dressing/Undressing Lower body dressing      What is the patient wearing?: Underwear/pull up     Lower body assist Assist for lower body dressing: Minimal Assistance - Patient > 75%     Toileting Toileting    Toileting assist Assist for toileting: Moderate Assistance - Patient 50 - 74%     Transfers Chair/bed transfer  Transfers assist     Chair/bed transfer assist level: Contact Guard/Touching assist     Locomotion Ambulation  Ambulation assist      Assist level: Contact Guard/Touching assist Assistive device: [place]-rolling Max distance: 33 feet   Walk 10 feet activity   Assist     Assist level: Contact Guard/Touching assist Assistive device: [place]-rolling   Walk 50 feet activity   Assist Walk 50 feet with 2 turns activity did not occur: Safety/medical concerns (pain/fatigue)         Walk 150 feet activity   Assist Walk 150 feet activity did not occur: Safety/medical concerns         Walk 10 feet on uneven surface  activity   Assist Walk 10 feet on uneven surfaces activity did not occur: Safety/medical concerns         Wheelchair     Assist Is the patient using a wheelchair?: Yes Type of Wheelchair: Manual     Wheelchair assist level: Supervision/Verbal cueing Max wheelchair distance: 146ft    Wheelchair 50 feet with 2 turns activity    Assist        Assist Level: Supervision/Verbal cueing   Wheelchair 150 feet activity     Assist      Assist Level: Supervision/Verbal cueing   Blood pressure 135/72, pulse 94, temperature 98 F (36.7 C), temperature source Oral, resp. rate 19, height 5\' 4"  (1.626 m), weight 68.1 kg, last menstrual period 06/14/2011, SpO2 99%.  Medical Problem List and Plan: 1. Functional deficits secondary to right tibia fracture status post IM nail             -patient may not shower unless cast is totally covered             -ELOS/Goals: 7 to 10 days, modified independent PT/OT -Continue CIR 2.  Antithrombotics: -DVT/anticoagulation:  Pharmaceutical: Lovenox 40mg  daily             -antiplatelet therapy: N/a 3. Pain Management:  Oxycodone prn. Continue robaxin 500 mg QID and tylenol 650 mg qid prn.             --ice/heat prn -12/25/23 pain controlled but suggested she monitor the timing of doses particularly at night so she can try to improve her sleep a bit/have better pain control overnight; having spasms she says, may need to consider switching to other muscle relaxant but for now leave robaxin. Kpad ordered. -3/31 and reports pain overall controlled and improving, continues to intermittently use oxycodone 4. Mood/Behavior/Sleep: LCSW to follow for evaluation and support.              -antipsychotic agents: N/A  -Melatonin PRN  -Buspar 5mg  BID 5. Neuropsych/cognition: This patient is capable of making decisions on her own behalf. 6. Skin/Wound Care: Routine pressure relief measures.  7. Fluids/Electrolytes/Nutrition: Monitor I/O. Continue vitamins. Routine labs  -12/25/23 CMP yesterday basically unremarkable, Mg today WNL.  8. 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 B12 level WNL 432 on 3/30   9.  Peripheral edema/HTN: Monitor BP TID. Continue Demadex 20mg  bid.   -3/31 BP stable, continue to monitor Vitals:   12/24/23 1446 12/24/23 2022 12/25/23 0502 12/25/23 1633  BP: (!) 114/96 123/63 107/64 113/63   12/25/23 1929 12/26/23 0448 12/26/23 1607  BP: 115/66 110/61 135/72    10. Anxiety d/o?: On buspar 5mg  BID 11. Hypokalemia: Will check daily and supplement aggressively (currently BID)  -12/25/23 K 4.0 yesterday, monitor  -3/31 potassium stable at 3.9, continue current regimen 12. Hypomagnesia: Recheck in am and supplement which will help K+ also  -  12/25/23 Mg 1.7 today, monitor 13. Peripheral neuropathy: Continue gabapentin 300mg  TID. --Was on high dose B12 PTA-->-check level in am> WNL 432 on 12/25/23 14. GERD: stable on Protonix 40mg  daily 15. Low normal Vitamin D level 38.13 on 3/27: Add ergocalciferol 50,000U weekly for supplement.  16.  Constipation.  Last bowel movement prior to admission.  Daily MiraLAX, Senokot-S 2 tabs twice daily, and as needed sorbitol started on admission. -12/25/23 still no BM, added colace 100mg  daily, increased miralax to BID, Mg Citrate PRN if no BM later, monitor   3/31 improved after BM today and yesterday    LOS: 2 days A FACE TO FACE EVALUATION WAS PERFORMED  Fanny Dance 12/26/2023, 5:37 PM

## 2023-12-27 DIAGNOSIS — I10 Essential (primary) hypertension: Secondary | ICD-10-CM | POA: Diagnosis not present

## 2023-12-27 DIAGNOSIS — K5900 Constipation, unspecified: Secondary | ICD-10-CM | POA: Diagnosis not present

## 2023-12-27 DIAGNOSIS — M79604 Pain in right leg: Secondary | ICD-10-CM | POA: Diagnosis not present

## 2023-12-27 DIAGNOSIS — S82201S Unspecified fracture of shaft of right tibia, sequela: Secondary | ICD-10-CM | POA: Diagnosis not present

## 2023-12-27 MED ORDER — OXYCODONE HCL 5 MG PO TABS
5.0000 mg | ORAL_TABLET | Freq: Every day | ORAL | Status: DC
  Administered 2023-12-28 – 2023-12-31 (×4): 5 mg via ORAL
  Filled 2023-12-27 (×4): qty 1

## 2023-12-27 NOTE — IPOC Note (Signed)
 Overall Plan of Care [facility]) Patient Details Name: Annette Lucas MRN: [id number] DOB: 02-Dec-1970  Admitting Diagnosis: Tibia/fibula fracture, right, sequela  Hospital Problems: Principal Problem:   Tibia/fibula fracture, right, sequela     Functional Problem List: Nursing Medication Management, Safety, Bowel, Endurance, Pain  PT Balance, Endurance, Edema, Motor, Pain, Sensory, Skin Integrity  OT Balance, Safety, Endurance, Motor  SLP    TR         Basic ADL's: OT Grooming, Bathing, Dressing, Toileting     Advanced  ADL's: OT Light Housekeeping     Transfers: PT Bed Mobility, Bed to Chair, Car, Occupational psychologist, Research scientist (life sciences): PT Ambulation, Psychologist, prison and probation services, Stairs     Additional Impairments: OT None  SLP        TR      Anticipated Outcomes Item Anticipated Outcome  Self Feeding mod I  Swallowing      Basic self-care  mod I  Toileting  mod I   Bathroom Transfers mod I  Bowel/Bladder  Manage bowel w mod I assist  Transfers  mod I  Locomotion  mod I  Communication     Cognition     Pain  Pain < 4 with prns  Safety/Judgment  Manage safety w cues   Therapy Plan: PT Intensity: Minimum of 1-2 x/day ,45 to 90 minutes PT Frequency: 5 out of 7 days PT Duration Estimated Length of Stay: 7-10 days OT Intensity: Minimum of 1-2 x/day, 45 to 90 minutes OT Frequency: 5 out of 7 days OT Duration/Estimated Length of Stay: 7-10 days     Team Interventions: Nursing Interventions Patient/Family Education, Bowel Management, Pain Management, Disease Management/Prevention, Discharge Planning, Medication Management  PT interventions Ambulation/gait training, Community reintegration, DME/adaptive equipment instruction, Neuromuscular re-education, Psychosocial support, Stair training, UE/LE Strength taining/ROM, Wheelchair propulsion/positioning, Warden/ranger, Discharge planning, Pain management, Skin care/wound management,  Therapeutic Activities, UE/LE Coordination activities, Cognitive remediation/compensation, Disease management/prevention, Functional mobility training, Splinting/orthotics, Patient/family education, Therapeutic Exercise, Visual/perceptual remediation/compensation  OT Interventions Balance/vestibular training, Discharge planning, Pain management, Self Care/advanced ADL retraining, Therapeutic Activities, UE/LE Coordination activities, Disease mangement/prevention, Functional mobility training, Patient/family education, Skin care/wound managment, Therapeutic Exercise, Community reintegration, DME/adaptive equipment instruction, Neuromuscular re-education, Psychosocial support, Splinting/orthotics, UE/LE Strength taining/ROM, Wheelchair propulsion/positioning  SLP Interventions    TR Interventions    SW/CM Interventions Discharge Planning, Psychosocial Support, Patient/Family Education   Barriers to Discharge MD  Medical stability  Nursing Decreased caregiver support, Home environment access/layout 1 level 2 ste right rail solo, works remotely) Just completed OP therapy  Not driving friends/family to drive. mon to assist  PT Inaccessible home environment, Decreased caregiver support, Home environment access/layout, Wound Care, Weight, Weight bearing restrictions pain  OT      SLP      SW Decreased caregiver support, Insurance for SNF coverage, Lack of/limited family support     Team Discharge Planning: Destination: PT-Home ,OT- Home , SLP-  Projected Follow-up: PT-Outpatient PT, OT-  Outpatient OT, SLP-  Projected Equipment Needs: PT- , OT-  , SLP-  Equipment Details: PT-pt has RW, Rollator, cane, WC, shower chair, OT-owns all DME Patient/family involved in discharge planning: PT- Patient,  OT-Patient, SLP-   MD ELOS: 7-10 Medical Rehab Prognosis:  Excellent Assessment: The patient has been admitted for CIR therapies with the diagnosis of right tibia fracture status post IM nail . The team  will be addressing functional mobility, strength, stamina, balance, safety, adaptive techniques and equipment, self-care, bowel and bladder mgt, patient and  caregiver education. Goals have been set at Mod I. Anticipated discharge destination is Home.        See Team Conference Notes for weekly updates to the plan of care

## 2023-12-27 NOTE — Progress Notes (Signed)
 PROGRESS NOTE   Subjective/Complaints: Patient reports increased pain this morning.  She would like her pain medication to be scheduled in the morning because pain interferes with therapy if she gets medication late.  Her pain is controlled when using the medication.   ROS: Patient denies fever, new vision changes, dizziness, nausea, vomiting, diarrhea,  shortness of breath or chest pain, headache, or mood change.   Objective:   No results found. Recent Labs    12/24/23 1545 12/26/23 0512  WBC 11.3* 12.0*  HGB 12.2 12.3  HCT 36.6 36.6  PLT 333 322   Recent Labs    12/24/23 1545 12/26/23 0512  NA 141 137  K 4.0 3.9  CL 104 99  CO2 30 30  GLUCOSE 96 117*  BUN 18 16  CREATININE 0.71 0.71  CALCIUM 9.1 9.4        No intake or output data in the 24 hours ending 12/27/23 1447       Physical Exam: Vital Signs Blood pressure 128/74, pulse 92, temperature 97.9 F (36.6 C), resp. rate 18, height 5\' 4"  (1.626 m), weight 68.1 kg, last menstrual period 06/14/2011, SpO2 100%.  Constitution: Appropriate appearance for age. No apparent distress.  Laying in bed Resp: No respiratory distress. No accessory muscle usage. on RA and CTAB Cardio: Well perfused appearance. No peripheral edema in LLE, unable to assess in RLE.  Brisk capillary refill bilaterally. RRR, no m/r/g appreciated Abdomen: Soft, nontender, nondistended, positive bowel sounds Psych: Appropriate mood and affect. MSK: Right lower extremity cast.  Full active range of motion all other extremities  Neuro: AAOx4. No apparent cognitive deficits.  Bilateral upper lower extremity stocking glove pattern peripheral neuropathy. 5 out of 5 strength in bilateral upper extremities and left lower extremity; right lower extremity 4 out of 5 hip flexion, can wiggle toes antigravity and against resistance     Assessment/Plan: 1. Functional deficits which require 3+  hours per day of interdisciplinary therapy in a comprehensive inpatient rehab setting. Physiatrist is providing close team supervision and 24 hour management of active medical problems listed below. Physiatrist and rehab team continue to assess barriers to discharge/monitor patient progress toward functional and medical goals  Care Tool:  Bathing              Bathing assist Assist Level: Minimal Assistance - Patient > 75%     Upper Body Dressing/Undressing Upper body dressing   What is the patient wearing?: Pull over shirt    Upper body assist Assist Level: Supervision/Verbal cueing    Lower Body Dressing/Undressing Lower body dressing      What is the patient wearing?: Underwear/pull up     Lower body assist Assist for lower body dressing: Minimal Assistance - Patient > 75%     Toileting Toileting    Toileting assist Assist for toileting: Moderate Assistance - Patient 50 - 74%     Transfers Chair/bed transfer  Transfers assist     Chair/bed transfer assist level: Contact Guard/Touching assist     Locomotion Ambulation   Ambulation assist      Assist level: Contact Guard/Touching assist Assistive device: [place]-rolling Max distance: 30   Walk 10  feet activity   Assist     Assist level: Contact Guard/Touching assist Assistive device: [place]-rolling   Walk 50 feet activity   Assist Walk 50 feet with 2 turns activity did not occur: Safety/medical concerns (pain/fatigue)         Walk 150 feet activity   Assist Walk 150 feet activity did not occur: Safety/medical concerns         Walk 10 feet on uneven surface  activity   Assist Walk 10 feet on uneven surfaces activity did not occur: Safety/medical concerns         Wheelchair     Assist Is the patient using a wheelchair?: Yes Type of Wheelchair: Manual    Wheelchair assist level: Supervision/Verbal cueing Max wheelchair distance: 150+    Wheelchair 50 feet with 2  turns activity    Assist        Assist Level: Supervision/Verbal cueing   Wheelchair 150 feet activity     Assist      Assist Level: Supervision/Verbal cueing   Blood pressure 128/74, pulse 92, temperature 97.9 F (36.6 C), resp. rate 18, height 5\' 4"  (1.626 m), weight 68.1 kg, last menstrual period 06/14/2011, SpO2 100%.  Medical Problem List and Plan: 1. Functional deficits secondary to right tibia fracture status post IM nail             -patient may not shower unless cast is totally covered             -ELOS/Goals: 7 to 10 days, modified independent PT/OT -Continue CIR 2.  Antithrombotics: -DVT/anticoagulation:  Pharmaceutical: Lovenox 40mg  daily             -antiplatelet therapy: N/a 3. Pain Management:  Oxycodone prn. Continue robaxin 500 mg QID and tylenol 650 mg qid prn.             --ice/heat prn -12/25/23 pain controlled but suggested she monitor the timing of doses particularly at night so she can try to improve her sleep a bit/have better pain control overnight; having spasms she says, may need to consider switching to other muscle relaxant but for now leave robaxin. Kpad ordered. -3/31 and reports pain overall controlled and improving, continues to intermittently use oxycodone 4/1 schedule oxycodone 5 mg at 7 AM before therapy 4. Mood/Behavior/Sleep: LCSW to follow for evaluation and support.              -antipsychotic agents: N/A  -Melatonin PRN  -Buspar 5mg  BID 5. Neuropsych/cognition: This patient is capable of making decisions on her own behalf. 6. Skin/Wound Care: Routine pressure relief measures.  7. Fluids/Electrolytes/Nutrition: Monitor I/O. Continue vitamins. Routine labs  -12/25/23 CMP yesterday basically unremarkable, Mg today WNL.  8. 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 B12 level WNL 432 on 3/30   9. Peripheral edema/HTN: Monitor BP TID. Continue Demadex 20mg  bid.   -4/1 BP stable continue to  monitor Vitals:   12/24/23 1446 12/24/23 2022 12/25/23 0502 12/25/23 1633  BP: (!) 114/96 123/63 107/64 113/63   12/25/23 1929 12/26/23 0448 12/26/23 1607 12/26/23 1937  BP: 115/66 110/61 135/72 (!) 132/59   12/27/23 0458 12/27/23 1418  BP: 100/62 128/74    10. Anxiety d/o?: On buspar 5mg  BID 11. Hypokalemia: Will check daily and supplement aggressively (currently BID)  -12/25/23 K 4.0 yesterday, monitor  -3/31 potassium stable at 3.9, continue current regimen 12. Hypomagnesia: Recheck in am and supplement which will help K+ also  -12/25/23  Mg 1.7 today, monitor 13. Peripheral neuropathy: Continue gabapentin 300mg  TID. --Was on high dose B12 PTA-->-check level in am> WNL 432 on 12/25/23 14. GERD: stable on Protonix 40mg  daily 15. Low normal Vitamin D level 38.13 on 3/27: Add ergocalciferol 50,000U weekly for supplement.  16.  Constipation.  Last bowel movement prior to admission.  Daily MiraLAX, Senokot-S 2 tabs twice daily, and as needed sorbitol started on admission. -12/25/23 still no BM, added colace 100mg  daily, increased miralax to BID, Mg Citrate PRN if no BM later, monitor   3/31 improved after BM today and yesterday 4/1 LBM yesterday continue current regimen    LOS: 3 days A FACE TO FACE EVALUATION WAS PERFORMED  Fanny Dance 12/27/2023, 2:47 PM

## 2023-12-27 NOTE — Progress Notes (Signed)
 Occupational Therapy Session Note  Patient Details  Name: Annette Lucas MRN: [id number] Date of Birth: [birth year]  Today's Date: 12/27/2023 OT Individual Time: 1005-1100 OT Individual Time Calculation (min): 55 min    Short Term Goals: Week 1:  OT Short Term Goal 1 (Week 1): STG=LTG d/t ELOS  Skilled Therapeutic Interventions/Progress Updates:  Skilled OT intervention completed with focus on ADL retraining, DC planning. Pt received seated in w/c, agreeable to session. Unrated pain reported in RLE; pre-medicated. OT offered rest breaks and repositioning throughout for pain reduction.  Pt with initial frustration regarding lack of arrival with pain meds and asked OT to verify type of meds and frequency so she can double check when she is due for them instead of letting pain get ahead of her. Care team already made aware earlier by PT.  Pt requested to use bathroom. 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 Completed CGA sit > stand and ambulatory transfer with RW > BSC over toilet. Able to lower clothing with CGA, then continent of urinary void and BM. Able to wipe front periarea unassisted, however stood with CGA and required min A to clean posterior area. CGA/min A for dynamic standing balance during donning of pants. Ambulatory transfer with CGA using RW back to w/c. Set up A for hand hygiene.  Pt self-propelled in w/c > ADL apartment about 100 ft with mod I. Pt reported having walk in shower with STE and owns shower chair and TTB. Pt practiced backwards hop to enter shower using RW with CGA needed however to exit accidentally attempted to step forward with RLE to exit then recognized NWB status on RLE and required mod A to shift from almost placing RLE to LLE then hopping. Due to  already owned TTB, discussed that pt is likely safer with TTB to prevent hopping and especially with exiting on wet floor. Demo provided on shower curtain tuck method to prevent water running into floor.  Discussed kitchen set up, and advised pt be predominantly w/c level when trying to carry plates of food, but did issue pt [place] bag for personal items due to anticipated mod I level and pt planning to ambulate around kitchen. Transported pt dependently in w/c back to room, then pt remained seated in w/c, with chair alarm on/activated, and with all needs in reach at end of session.   Therapy Documentation Precautions:  Precautions Precautions: Fall Recall of Precautions/Restrictions: Intact Required Braces or Orthoses: Splint/Cast Splint/Cast: RLE Restrictions Weight Bearing Restrictions Per Provider Order: No RLE Weight Bearing Per Provider Order: Non weight bearing    Therapy/Group: Individual Therapy  Melvyn Novas, MS, OTR/L  12/27/2023, 12:13 PM

## 2023-12-27 NOTE — Progress Notes (Signed)
 Physical Therapy Session Note  Patient Details  Name: Annette Lucas MRN: [id number] Date of Birth: 1971-07-30  Today's Date: 12/27/2023 PT Individual Time: 0900-0945 PT Individual Time Calculation (min): 45 min   Short Term Goals: Week 1:  PT Short Term Goal 1 (Week 1): STG=LTG 2/2 ELOS  Skilled Therapeutic Interventions/Progress Updates: Pt presents sitting in w/c, ice pack to R knee and agreeable to therapy.  Pt states pain but meds given.  Pt negotiates w/c to small gym for increased endurance and UE strengthening.  Pt performed UBE at Level 1 for 5' x 2, forwards and back.  Pt transfers sit to stand w/ CGA throughout session. Pt amb x30' w/ RW and cues for L foot protection 2/2 neuropathy to feet.  Pt negotiated w/c to room and then amb into room w/ RW and CGA.  Pt remained sitting in w/c w/ RLE elevated and ice pack returned to R knee.  All needs in reach.     Therapy Documentation Precautions:  Precautions Precautions: Fall Recall of Precautions/Restrictions: Intact Required Braces or Orthoses: Splint/Cast Splint/Cast: RLE Restrictions Weight Bearing Restrictions Per Provider Order: No RLE Weight Bearing Per Provider Order: Non weight bearing General:   Vital Signs:   Pain: 7/10 initially Pain Assessment Pain Scale: 0-10 Pain Score: 9     Therapy/Group: Individual Therapy  Lucio Edward 12/27/2023, 9:51 AM

## 2023-12-27 NOTE — Progress Notes (Signed)
 Physical Therapy Session Note  Patient Details  Name: Annette Lucas MRN: [id number] Date of Birth: 01-18-1971  Today's Date: 12/27/2023 PT Individual Time: [phone] and [id number] PT Individual Time Calculation (min): 41 min and 72 min  Short Term Goals: Week 1:  PT Short Term Goal 1 (Week 1): STG=LTG 2/2 ELOS  Skilled Therapeutic Interventions/Progress Updates:   Treatment Session 1 Received pt semi-reclined in bed, pt agreeable to PT treatment, and reported pain 9/10 in R knee radiating down to ankle. Pt reported calling and waiting for RN; therapist notified RN again of request for pain medication. Pt reported not sleeping well last night, waking up due to pain. Session with emphasis on functional mobility/transfers, toileting, generalized strengthening and endurance, and dynamic standing balance/coordination. Pt transferred supine<>sitting L EOB from flat bed without bedrails and supervision and reported urge to void. Donned L sock and shoe with max A due to pain and transferred bed<>bedside commode<>WC via stand<>pivot with RW and CGA. Pt able to manage clothing standing with CGA for balance and void/perform hygiene management without assist. Sat in WC at sink and washed hands & face, brushed teeth, and groomed mod I. RN arrived to administer medications and pt performed seated R knee extension 2x10 and R hip flexion 2x10 for active cool down. Concluded session with pt sitting in [facility] with all needs within reach. Provided pt with ice pack for R knee for pain relief and elevated RLE on elevating legrest and multiple pillows for comfort.  Treatment Session 2 Received pt sitting in WC, pt agreeable to PT treatment, and reported pain 6/10 in R knee radiating down to ankle - RN present to administer medication. Session with emphasis on functional mobility/transfers, generalized strengthening and endurance, dynamic standing balance/coordination, stair navigation, toileting, and gait training. Pt  performed all transfers with RW and CGA/close supervision throughout session with good adherence to RLE NWB precautions.   Pt performed WC mobility 152ft using BUE and supervision to main therapy gym with emphasis on BUE strength. In stairwell, transferred onto shower chair and navigated 2 6in steps with R handrail and CGA with min A to move shower chair up/down steps and cues for technique.   Pt then ambulated 17ft with RW and CGA/close supervision using a "hop to" pattern. Pt required occasional cues for "soft landing" on L heel and limited by fatigue/pain. Transferred into supine on wedge and performed the following exercises with emphasis on UE, LE, and core strength and ROM: -active assisted R heel slides 2x10 -active assisted R hip abduction 2x10 -SLR 2x8 on RLE with active assist -bridges on physioball 2x10 with assist to manage RLE -shoulder extensions into physioball 2x10 with 5 second isometric hold -chest press with 4lb dumbbells in each UE 2x15 -tricep extensions with 4lb dumbbell 2x12 bilaterally -hamstring stretch on LLE 3x30 second hold using gait belt. Attempted with RLE but unable to tolerate due to pain. Discussed follow up therapy and recommended OPPT once WB restrictions have been lifted. Transferred supine on wedge<>sitting EOM and transferred back into WC. Returned to room and transferred onto bedside commode with RW and close supervision. Pt able to manage clothing with CGA for balance and void/perform hygiene management without assist. Pt then ambulated 55ft with RW and CGA to recliner. Concluded session with pt sitting in recliner with all needs within reach. RN notified of request for pain medication and ice pack.  Therapy Documentation Precautions:  Precautions Precautions: Fall Recall of Precautions/Restrictions: Intact Required Braces or Orthoses: Splint/Cast Splint/Cast: RLE Restrictions Weight  Bearing Restrictions Per Provider Order: No RLE Weight Bearing Per  Provider Order: Non weight bearing  Therapy/Group: Individual Therapy Marlana Salvage Zaunegger Blima Rich PT, DPT 12/27/2023, 6:52 AM

## 2023-12-27 NOTE — Plan of Care (Signed)
  Problem: Consults Goal: RH GENERAL PATIENT EDUCATION Description: See Patient Education module for education specifics. Outcome: Progressing   Problem: RH BOWEL ELIMINATION Goal: RH STG MANAGE BOWEL WITH ASSISTANCE Description: STG Manage Bowel with toileting Assistance. Outcome: Progressing Goal: RH STG MANAGE BOWEL W/MEDICATION W/ASSISTANCE Description: STG Manage Bowel with Medication with mod I Assistance. Outcome: Progressing   Problem: RH SAFETY Goal: RH STG ADHERE TO SAFETY PRECAUTIONS W/ASSISTANCE/DEVICE Description: STG Adhere to Safety Precautions With Assistance/Device. Outcome: Progressing   Problem: RH PAIN MANAGEMENT Goal: RH STG PAIN MANAGED AT OR BELOW PT'S PAIN GOAL Description: Pain < 4 with prns Outcome: Progressing   Problem: RH KNOWLEDGE DEFICIT GENERAL Goal: RH STG INCREASE KNOWLEDGE OF SELF CARE AFTER HOSPITALIZATION Outcome: Progressing

## 2023-12-28 DIAGNOSIS — I10 Essential (primary) hypertension: Secondary | ICD-10-CM | POA: Diagnosis not present

## 2023-12-28 DIAGNOSIS — S82201S Unspecified fracture of shaft of right tibia, sequela: Secondary | ICD-10-CM | POA: Diagnosis not present

## 2023-12-28 DIAGNOSIS — K5900 Constipation, unspecified: Secondary | ICD-10-CM | POA: Diagnosis not present

## 2023-12-28 DIAGNOSIS — M79604 Pain in right leg: Secondary | ICD-10-CM | POA: Diagnosis not present

## 2023-12-28 NOTE — Progress Notes (Signed)
 Patient ID: Annette Lucas, female   DOB: Feb 28, 1971, 53 y.o.   MRN: [id number]  SW met with pt during OT session to provide updates from team conference, and d/c date 4/5. She reports her dtr will pick her up. She is aware therapies will be deferred until WB status changes and encouraged to discus with orthopedic about a referral.   Cecile Sheerer, MSW, LCSW Office: [phone] Cell: [phone] Fax: [phone]

## 2023-12-28 NOTE — Progress Notes (Signed)
 Occupational Therapy Session Note  Patient Details  Name: Annette Lucas MRN: [id number] Date of Birth: Oct 24, 1970  Today's Date: 12/28/2023 OT Individual Time: 1005-1045 & 5284-1324 OT Individual Time Calculation (min): 40 min & 70 min   Short Term Goals: Week 1:  OT Short Term Goal 1 (Week 1): STG=LTG d/t ELOS  Skilled Therapeutic Interventions/Progress Updates:  Session 1 Skilled OT intervention completed with focus on functional transfers, gentle stretching and LLE AROM. Pt received seated in w/c, agreeable to session. 7/10 pain reported in RLE; pre-medicated. OT offered rest breaks, repositioning throughout for pain reduction.  Pt verbalized soreness in LLE and BUE from yesterday. Declined self-care needs. Discussed DME at home, and recommended already owned [facility] for stand pivot vs ambulation when LLE feels weak or unstable as reported today from fatigue due to RLE NWB precaution.  Transported dependently in w/c <> gym for pain relief. Cues needed to lock w/c prior to standing, then CGA sit > stand and stand pivot with RW > EOM. Transitioned sit > supine with mod I with wedge under back and pillow under RLE offered for comfort.   Pt completed the following stretches/AROM to alleviate soreness: (Semi supine) -L knee hug for gluteal stretch, x15 with 5 sec hold -Bilateral straight leg raise x15 each leg -LLE hip abduction x10 (Seated EOM) -Bilateral cervical lateral flexion x5, 5 sec hold -Cross body arm reach x10 each arm  CGA sit > stand and stand pivot with RW > w/c. Pt remained seated in w/c, with chair alarm on/activated, and with all needs in reach at end of session.  Session 2 Skilled OT intervention completed with focus on ADL retraining, functional endurance, and mobility within a shower context. Pt received seated in w/c, agreeable to session. No pain reported.  Pt completed all sit > stands with supervision and stand pivot transfers with CGA while using RW. Min cues  needed for body positioning especially when backing into shower to TTB. Trash can utilized to elevate RLE for pain relief during shower.  Waterproof cover applied to RLE cast prior to shower. Education provided on double bag method with trash bag. Pt was able to bathe all parts with set up A, at the seated level only, with lateral leans utilized for periareas. Stand pivot > w/c. Able to donn shirt/deo with set up A. Threaded LB clothing with use of reacher, overall at close supervision level using RW in stance. Donned socks/shoes with assist only for tying due to L lacing. Dried hair independently while seated.  Pt ambulated > recliner with CGA using RW. OT switched out RLE elevating leg rest for new one as pt reports that the previous one collapsed down and was twisting. Topical ice applied to RLE for edema management. Pt remained seated in recliner, with chair alarm on/activated, and with all needs in reach at end of session.   Therapy Documentation Precautions:  Precautions Precautions: Fall Recall of Precautions/Restrictions: Intact Required Braces or Orthoses: Splint/Cast Splint/Cast: RLE Restrictions Weight Bearing Restrictions Per Provider Order: No RLE Weight Bearing Per Provider Order: Non weight bearing    Therapy/Group: Individual Therapy  Melvyn Novas, MS, OTR/L  12/28/2023, 2:22 PM

## 2023-12-28 NOTE — Progress Notes (Signed)
 Physical Therapy Session Note  Patient Details  Name: Annette Lucas MRN: [id number] Date of Birth: 10/16/1970  Today's Date: 12/28/2023 PT Individual Time: 4098-1191 PT Individual Time Calculation (min): 53 min   Short Term Goals: Week 1:  PT Short Term Goal 1 (Week 1): STG=LTG 2/2 ELOS  Skilled Therapeutic Interventions/Progress Updates:     Pt seated in recliner upon arrival. Pt reports R knee pain, not quantiifed, premedicated, therapist provided rest breaks and repositioning and donned ice to R knee at end of session.   Pt performed sit to stand and stand pivot transfer throughout session with supervision throughout session.   Pt transferred onto shower chair and navigated 2 6in steps x 4 with R handrail and min A progressing to CGA with repeated practice with pt able to move shower chair up the steps but requiring assistance to move the shower chair down the stairs, with verbal cues provided for technique/sequencing and to slow down for pt overall safety.   Pt reports her daughter will be assisting her with this at home. Therapist scheduled family training for 4/3 at 1:00 for hands on practice.   Pt donned leg rests with min A progressing to supervision, vebral cues provided for technique. Pt doffed leg rests with supervision.   Pt self propelled WC main gym to room with mod I.   Pt seated in recliner with all needs within reach at end of session.     Therapy Documentation Precautions:  Precautions Precautions: Fall Recall of Precautions/Restrictions: Intact Required Braces or Orthoses: Splint/Cast Splint/Cast: RLE Restrictions Weight Bearing Restrictions Per Provider Order: No RLE Weight Bearing Per Provider Order: Non weight bearing  Therapy/Group: Individual Therapy  [facility], [place], DPT  12/28/2023, 3:21 PM

## 2023-12-28 NOTE — Discharge Summary (Signed)
 Physician Discharge Summary  Patient ID: Annette Lucas MRN: [id number] DOB/AGE: [birth year] 53 y.o.  Admit date: 12/24/2023 Discharge date: 12/31/2023  Discharge Diagnoses:  Principal Problem:   Tibia/fibula fracture, right, sequela Active Problems:   Peripheral neuropathy   GERD (gastroesophageal reflux disease)   Low vitamin D level   Generalized anxiety disorder   Leucocytosis   Acute blood loss anemia   Discharged Condition: stable  Significant Diagnostic Studies: DG Tibia/Fibula Right Result Date: 12/29/2023 CLINICAL DATA:  Postop ORIF EXAM: RIGHT TIBIA AND FIBULA - 2 VIEW COMPARISON:  12/21/2023 FINDINGS: Overlapping cast in place obscures underlying bone detail. Stable appearance of long intramedullary tibial rod with proximal and distal fixation screws, 2 each. This transfixes the distal tibial metaphyseal comminuted fracture. Near anatomic alignment. There is an adjacent comminuted mildly displaced fibular metaphyseal fracture which has similar appearance to previous examination. No additional fracture or dislocation. Preserved adjacent joint spaces. Imaging was obtained to aid in treatment. IMPRESSION: No significant interval change. Electronically Signed   By: Karen Kays M.D.   On: 12/29/2023 14:39    Labs:  Basic Metabolic Panel: Recent Labs  Lab 12/24/23 0518 12/24/23 1545 12/25/23 0753 12/26/23 0512 12/29/23 0607  NA 141 141  --  137 138  K 3.9 4.0  --  3.9 3.7  CL 103 104  --  99 98  CO2 30 30  --  30 29  GLUCOSE 110* 96  --  117* 108*  BUN 22* 18  --  16 20  CREATININE 0.76 0.71  --  0.71 0.76  CALCIUM 9.1 9.1  --  9.4 9.2  MG  --   --  1.7  --   --     CBC: Recent Labs  Lab 12/24/23 1545 12/26/23 0512 12/29/23 0607  WBC 11.3* 12.0* 9.5  NEUTROABS 7.4  --   --   HGB 12.2 12.3 11.6*  HCT 36.6 36.6 33.1*  MCV 101.1* 99.2 97.6  PLT 333 322 340    CBG: No results for input(s): "GLUCAP" in the last 168 hours.  Brief HPI:   Annette Lucas is a 53  y.o. female with history of HTN, IBS, EtOH abuse in the past, neuropathy, recent stroke who slipped and fell in the bathroom with acute onset of ankle pain, inability to bear weight on leg and sustained a large open wound.  She was found to have open distal right tib-fib fracture and underwent I&D with IM nailing on 12/21/2023 by Dr. Jena Gauss..  To be NWB RLE with Lovenox for DVT prophylaxis.  Vitamin D levels were noted to be low at 38.13.  She was also noted to be hypokalemic at admission with potassium at 3.1 and magnesium was also low at 1.6.  Electrolyte abnormalities were treated.  PT/OT was working with patient was requiring min assist +2 for mobility and ADLs.  She lives alone and was independent prior to admission.  CIR was recommended due to function decline.   Hospital Course: Delyla Sandeen was admitted to rehab 12/24/2023 for inpatient therapies to consist of PT and OT at least three hours five days a week. Past admission physiatrist, therapy team and rehab RN have worked together to provide customized collaborative inpatient rehab.  Her blood pressures were monitored on TID basis and have been controlled.  Follow-up labs showed magnesium to be within normal limits.  She was maintained on K dur 20 mEq twice daily during her stay.  Serial labs showed potassium levels to be stable and K  dur was decreased to once a day.  Ergocalciferol added for vitamin D deficiency.  Vitamin B12 levels were noted to be WNL at 432.  She was maintained on subcu Lovenox during her stay and ASA added for DVT prophylaxis at discharge.  Her mood has been stable.  P.o. intake has been good.  Gabapentin was resumed and was titrated up to home regimen.  Her pain control is improving with decrease in use of oxycodone.  She has been instructed on weaning this off after discharge.  Follow-up x-rays of right lower extremity shows stable hardware.  As edema resolved, she reported loosening of the splint which was changed out with  recommendations to follow-up with Ortho next week.  She made steady gains during his stay and is modified independent supervised setting.  She has been educated on HEP and follow-up therapy not recommended until weightbearing status advanced      Rehab course: During patient's stay in rehab weekly team conferences were held to monitor patient's progress, set goals and discuss barriers to discharge. At admission, patient required min assist with mobility and with ADL tasks. She  has had improvement in activity tolerance, balance, postural control as well as ability to compensate for deficits.  She has had improvement in functional use RLE as well as improvement in awareness. She is able to complete ADL tasks at modified independent level. She is modified independent for transfers and is able to ambulate 42' with use of Koffman and rest break. Family education has been completed.    Discharge disposition: 01-Home or Self Care  Diet: Regular  Special Instructions: 1.  No weight on RLE.  Keep splint clean and dry. 2.  Recommend BMET/CBC in 1-2 weeks to monitor  for stability.     Allergies as of 12/31/2023       Reactions   Levofloxacin Anaphylaxis   Other Anaphylaxis   Meat allergy: Beef, pork, lamb   Penicillins Anaphylaxis   Has patient had a PCN reaction causing immediate rash, facial/tongue/throat swelling, SOB or lightheadedness with hypotension: Yes Has patient had a PCN reaction causing severe rash involving mucus membranes or skin necrosis: No Has patient had a PCN reaction that required hospitalization Yes Has patient had a PCN reaction occurring within the last 10 years: No If all of the above answers are "NO", then may proceed with Cephalosporin use.        Medication List     STOP taking these medications    ibuprofen 200 MG tablet Commonly known as: ADVIL   milk thistle 175 MG tablet   potassium chloride 10 MEQ tablet Commonly known as: KLOR-CON Replaced by:  potassium chloride SA 20 MEQ tablet       TAKE these medications    acetaminophen 325 MG tablet Commonly known as: TYLENOL Take 2 tablets (650 mg total) by mouth every 6 (six) hours as needed for mild pain (pain score 1-3). What changed: reasons to take this   ASHWAGANDHA GUMMIES PO Take 2 each by mouth in the morning.   aspirin EC 325 MG tablet Take 1 tablet (325 mg total) by mouth daily.   busPIRone 5 MG tablet Commonly known as: BUSPAR Take 5 mg by mouth 2 (two) times daily.   cyanocobalamin 500 MCG tablet Commonly known as: VITAMIN B12 Take 1 tablet (500 mcg total) by mouth daily. What changed:  how much to take when to take this   docusate sodium 100 MG capsule Commonly known as: COLACE Take 1 capsule (  100 mg total) by mouth daily.   folic acid 1 MG tablet Commonly known as: FOLVITE Take 1 tablet (1 mg total) by mouth daily. What changed: when to take this   gabapentin 600 MG tablet Commonly known as: NEURONTIN Take 600 mg by mouth 4 (four) times daily.   magnesium oxide 400 (241.3 Mg) MG tablet Commonly known as: MAG-OX Take 1 tablet (400 mg total) by mouth daily. What changed: when to take this   melatonin 5 MG Tabs Take 1 tablet (5 mg total) by mouth at bedtime as needed.   methocarbamol 500 MG tablet Commonly known as: ROBAXIN Take 1 tablet (500 mg total) by mouth 4 (four) times daily. What changed:  when to take this reasons to take this   oxyCODONE 5 MG immediate release tablet--Rx # 20 pills. Commonly known as: Oxy IR/ROXICODONE Take 1 tablet (5 mg total) by mouth every 8 (eight) hours as needed for severe pain (pain score 7-10). What changed:  when to take this reasons to take this   pantoprazole 40 MG tablet Commonly known as: PROTONIX Take 40 mg by mouth in the morning.   potassium chloride SA 20 MEQ tablet Commonly known as: KLOR-CON M Take 1 tablet (20 mEq total) by mouth in the morning. Replaces: potassium chloride 10 MEQ  tablet   QC Tumeric Complex 500 MG Caps Generic drug: Turmeric Take 500 mg by mouth in the morning.   Senna-S 8.6-50 MG tablet Generic drug: senna-docusate Take 2 tablets by mouth 2 (two) times daily.   torsemide 20 MG tablet Commonly known as: DEMADEX Take 20 mg by mouth 2 (two) times daily.   Vitamin D (Ergocalciferol) 1.25 MG (50000 UNIT) Caps capsule Commonly known as: DRISDOL Take 1 capsule (50,000 Units total) by mouth every 7 (seven) days.        Follow-up Information     Lianne Moris, PA-C Follow up.   Specialty: Family Medicine Why: Call in 1-2 days for post hospital follow up Contact information: [address] [place] [address] [phone]         Roby Lofts, MD Follow up.   Specialty: Orthopedic Surgery Why: Call in 1-2 days for post hospital follow up Contact information: [address] [place] [address] [phone]         Horton Chin, MD. Call.   Specialty: Physical Medicine and Rehabilitation Why: As needed Contact information: [address] [place] [address] [phone]                 Signed: Jacquelynn Cree 01/02/2024, 5:33 PM

## 2023-12-28 NOTE — Plan of Care (Signed)
  Problem: Consults Goal: RH GENERAL PATIENT EDUCATION Description: See Patient Education module for education specifics. Outcome: Progressing   Problem: RH BOWEL ELIMINATION Goal: RH STG MANAGE BOWEL WITH ASSISTANCE Description: STG Manage Bowel with toileting Assistance. Outcome: Progressing Goal: RH STG MANAGE BOWEL W/MEDICATION W/ASSISTANCE Description: STG Manage Bowel with Medication with mod I Assistance. Outcome: Progressing   Problem: RH SAFETY Goal: RH STG ADHERE TO SAFETY PRECAUTIONS W/ASSISTANCE/DEVICE Description: STG Adhere to Safety Precautions With Assistance/Device. Outcome: Progressing   Problem: RH PAIN MANAGEMENT Goal: RH STG PAIN MANAGED AT OR BELOW PT'S PAIN GOAL Description: Pain < 4 with prns Outcome: Progressing

## 2023-12-28 NOTE — Progress Notes (Signed)
 PROGRESS NOTE   Subjective/Complaints: Doing well overall this morning.  Reports sore from therapy yesterday, pain overall controlled with oxycodone.  ROS: Patient denies fever, dizziness, nausea, vomiting, diarrhea,  shortness of breath or chest pain, headache, or mood change.  No new motor or sensory changes.   Objective:   No results found. Recent Labs    12/26/23 0512  WBC 12.0*  HGB 12.3  HCT 36.6  PLT 322   Recent Labs    12/26/23 0512  NA 137  K 3.9  CL 99  CO2 30  GLUCOSE 117*  BUN 16  CREATININE 0.71  CALCIUM 9.4         Intake/Output Summary (Last 24 hours) at 12/28/2023 1355 Last data filed at 12/27/2023 1956 Gross per 24 hour  Intake 600 ml  Output --  Net 600 ml         Physical Exam: Vital Signs Blood pressure 122/75, pulse 95, temperature 98.5 F (36.9 C), resp. rate 18, height 5\' 4"  (1.626 m), weight 68.1 kg, last menstrual period 06/14/2011, SpO2 98%.  Constitution: Appropriate appearance for age. No apparent distress.  Laying in bed Resp: No respiratory distress. No accessory muscle usage. on RA and CTAB Cardio: Well perfused appearance. No peripheral edema in LLE, unable to assess in RLE.  Brisk capillary refill bilaterally. RRR, no m/r/g appreciated Abdomen: Soft, nontender, nondistended, positive bowel sounds Psych: Appropriate mood and affect. MSK: Right lower extremity cast.  Full active range of motion all other extremities  Neuro: AAOx4. No apparent cognitive deficits.  Bilateral upper lower extremity stocking glove pattern peripheral neuropathy. 5 out of 5 strength in bilateral upper extremities and left lower extremity; right lower extremity 4 out of 5 hip flexion, can wiggle toes antigravity and against resistance Exam stable 4/2     Assessment/Plan: 1. Functional deficits which require 3+ hours per day of interdisciplinary therapy in a comprehensive inpatient rehab  setting. Physiatrist is providing close team supervision and 24 hour management of active medical problems listed below. Physiatrist and rehab team continue to assess barriers to discharge/monitor patient progress toward functional and medical goals  Care Tool:  Bathing    Body parts bathed by patient: Right arm, Left arm, Chest, Abdomen, Front perineal area, Buttocks, Right upper leg, Left upper leg, Left lower leg, Face     Body parts n/a: Right lower leg (casted)   Bathing assist Assist Level: Set up assist     Upper Body Dressing/Undressing Upper body dressing   What is the patient wearing?: Pull over shirt    Upper body assist Assist Level: Set up assist    Lower Body Dressing/Undressing Lower body dressing      What is the patient wearing?: Underwear/pull up     Lower body assist Assist for lower body dressing: Supervision/Verbal cueing     Toileting Toileting    Toileting assist Assist for toileting: Minimal Assistance - Patient > 75%     Transfers Chair/bed transfer  Transfers assist     Chair/bed transfer assist level: Contact Guard/Touching assist     Locomotion Ambulation   Ambulation assist      Assist level: Contact Guard/Touching assist Assistive  device: Abaya-rolling Max distance: 30   Walk 10 feet activity   Assist     Assist level: Contact Guard/Touching assist Assistive device: Andreoli-rolling   Walk 50 feet activity   Assist Walk 50 feet with 2 turns activity did not occur: Safety/medical concerns (pain/fatigue)         Walk 150 feet activity   Assist Walk 150 feet activity did not occur: Safety/medical concerns         Walk 10 feet on uneven surface  activity   Assist Walk 10 feet on uneven surfaces activity did not occur: Safety/medical concerns         Wheelchair     Assist Is the patient using a wheelchair?: Yes Type of Wheelchair: Manual    Wheelchair assist level: Supervision/Verbal  cueing Max wheelchair distance: 150+    Wheelchair 50 feet with 2 turns activity    Assist        Assist Level: Supervision/Verbal cueing   Wheelchair 150 feet activity     Assist      Assist Level: Supervision/Verbal cueing   Blood pressure 122/75, pulse 95, temperature 98.5 F (36.9 C), resp. rate 18, height 5\' 4"  (1.626 m), weight 68.1 kg, last menstrual period 06/14/2011, SpO2 98%.  Medical Problem List and Plan: 1. Functional deficits secondary to right tibia fracture status post IM nail             -patient may not shower unless cast is totally covered             -ELOS/Goals: 7 to 10 days, modified independent PT/OT -Continue CIR -Team conference today please see physician documentation under team conference tab, met with team  to discuss problems,progress, and goals. Formulized individual treatment plan based on medical history, underlying problem and comorbidities.   2.  Antithrombotics: -DVT/anticoagulation:  Pharmaceutical: Lovenox 40mg  daily             -antiplatelet therapy: N/a 3. Pain Management:  Oxycodone prn. Continue robaxin 500 mg QID and tylenol 650 mg qid prn.             --ice/heat prn -12/25/23 pain controlled but suggested she monitor the timing of doses particularly at night so she can try to improve her sleep a bit/have better pain control overnight; having spasms she says, may need to consider switching to other muscle relaxant but for now leave robaxin. Kpad ordered. -3/31 and reports pain overall controlled and improving, continues to intermittently use oxycodone 4/1 schedule oxycodone 5 mg at 7 AM before therapy  4/2 continue current pain management for now and monitor 4. Mood/Behavior/Sleep: LCSW to follow for evaluation and support.              -antipsychotic agents: N/A  -Melatonin PRN  -Buspar 5mg  BID 5. Neuropsych/cognition: This patient is capable of making decisions on her own behalf. 6. Skin/Wound Care: Routine pressure relief  measures.  7. Fluids/Electrolytes/Nutrition: Monitor I/O. Continue vitamins. Routine labs  -12/25/23 CMP yesterday basically unremarkable, Mg today WNL.  8. 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 B12 level WNL 432 on 3/30   9. Peripheral edema/HTN: Monitor BP TID. Continue Demadex 20mg  bid.   -4/1 BP stable continue to monitor  -4/2 Controlled, continue to monitor  Vitals:   12/24/23 1446 12/24/23 2022 12/25/23 0502 12/25/23 1633  BP: (!) 114/96 123/63 107/64 113/63   12/25/23 1929 12/26/23 0448 12/26/23 1607 12/26/23 1937  BP: 115/66 110/61 135/72 (!) 132/59  12/27/23 0458 12/27/23 1418 12/27/23 1826  BP: 100/62 128/74 122/75    10. Anxiety d/o?: On buspar 5mg  BID 11. Hypokalemia: Will check daily and supplement aggressively (currently BID)  -12/25/23 K 4.0 yesterday, monitor  -3/31 potassium stable at 3.9, continue current regimen  Recheck tomrrow 12. Hypomagnesia: Recheck in am and supplement which will help K+ also  -12/25/23 Mg 1.7 today, monitor 13. Peripheral neuropathy: Continue gabapentin 300mg  TID. --Was on high dose B12 PTA-->-check level in am> WNL 432 on 12/25/23 14. GERD: stable on Protonix 40mg  daily 15. Low normal Vitamin D level 38.13 on 3/27: Add ergocalciferol 50,000U weekly for supplement.  16.  Constipation.  Last bowel movement prior to admission.  Daily MiraLAX, Senokot-S 2 tabs twice daily, and as needed sorbitol started on admission. -12/25/23 still no BM, added colace 100mg  daily, increased miralax to BID, Mg Citrate PRN if no BM later, monitor   3/31 improved after BM today and yesterday 4/2 LBM yesterday, improved    LOS: 4 days A FACE TO FACE EVALUATION WAS PERFORMED  Fanny Dance 12/28/2023, 1:55 PM

## 2023-12-28 NOTE — Plan of Care (Signed)
  Problem: Consults Goal: RH GENERAL PATIENT EDUCATION Description: See Patient Education module for education specifics. Outcome: Progressing   Problem: RH BOWEL ELIMINATION Goal: RH STG MANAGE BOWEL WITH ASSISTANCE Description: STG Manage Bowel with toileting Assistance. Outcome: Progressing   Problem: RH BOWEL ELIMINATION Goal: RH STG MANAGE BOWEL W/MEDICATION W/ASSISTANCE Description: STG Manage Bowel with Medication with mod I Assistance. Outcome: Progressing   Problem: RH SAFETY Goal: RH STG ADHERE TO SAFETY PRECAUTIONS W/ASSISTANCE/DEVICE Description: STG Adhere to Safety Precautions With Assistance/Device. Outcome: Progressing   Problem: RH PAIN MANAGEMENT Goal: RH STG PAIN MANAGED AT OR BELOW PT'S PAIN GOAL Description: Pain < 4 with prns Outcome: Progressing   Problem: RH KNOWLEDGE DEFICIT GENERAL Goal: RH STG INCREASE KNOWLEDGE OF SELF CARE AFTER HOSPITALIZATION Outcome: Progressing

## 2023-12-28 NOTE — Progress Notes (Signed)
 Physical Therapy Session Note  Patient Details  Name: Annette Lucas MRN: [id number] Date of Birth: Oct 26, 1970  Today's Date: 12/28/2023 PT Individual Time: 8657-8469 PT Individual Time Calculation (min): 41 min   Short Term Goals: Week 1:  PT Short Term Goal 1 (Week 1): STG=LTG 2/2 ELOS  Skilled Therapeutic Interventions/Progress Updates:   Received pt semi-reclined in bed, pt agreeable to PT treatment, and reported pain 7/10 in R knee radiating down anterior tib (premedicated). Pt also reported feeling very sore from therapy yesterday. Session with emphasis on functional mobility/transfers, toileting, generalized strengthening and endurance, and dynamic standing balance/coordination. Pt transferred semi-reclined<>sitting L EOB with HOB elevated and mod I and reported urge to void. Donned L sock and shoe with max A and pt performed all transfers with RW and CGA/close supervision throughout session. Pt able to manage clothing with CGA for balance and void and perform hygiene management without assist.   Pt sat in WC at sink and washed hands and face and brushed teeth independently. Pt transported to/from room in [facility] dependently for time management purposes. Pt requested to practice stair navigation this afternoon due to higher pain levels this morning - afternoon PT notified. Performed seated LLE strengthening on Kinetron at 20 cm/sec for 1 minute x 4 trials with emphasis on glute/quad strength with therapist providing manual counter resistance. Returned to room and concluded session with pt sitting in [facility] with all needs within reach. Provided pt with ice pack for R knee.  Therapy Documentation Precautions:  Precautions Precautions: Fall Recall of Precautions/Restrictions: Intact Required Braces or Orthoses: Splint/Cast Splint/Cast: RLE Restrictions Weight Bearing Restrictions Per Provider Order: No RLE Weight Bearing Per Provider Order: Non weight bearing  Therapy/Group: Individual  Therapy Marlana Salvage Zaunegger Blima Rich PT, DPT 12/28/2023, 6:53 AM

## 2023-12-28 NOTE — Discharge Instructions (Signed)
 Inpatient Rehab Discharge Instructions  Annette Lucas Discharge date and time:  12/31/23  Activities/Precautions/ Functional Status: Activity: no lifting, driving, or strenuous exercise till cleared by MD Diet: regular diet Wound Care: keep wound clean and dry   Functional status:  ___ No restrictions     ___ Walk up steps independently ___ 24/7 supervision/assistance   ___ Walk up steps with assistance ___ Intermittent supervision/assistance  ___ Bathe/dress independently ___ Walk with [place]     ___ Bathe/dress with assistance ___ Walk Independently    ___ Shower independently ___ Walk with assistance    ___ Shower with assistance ___ No alcohol     ___ Return to work/school ________  Special Instructions:  COMMUNITY REFERRALS UPON DISCHARGE:    OUTPATIENT THERAPY DEFERRED UNTIL WEIGHT BEARING RESTRICTIONS LIFTED. PLEASE USE HOME EXERCISE PROGRAM (HEP). BE SURE TO DISCUSS WITH ORTHOPEDIC TO DISCUSS REFERRAL FOR THERAPY.   My questions have been answered and I understand these instructions. I will adhere to these goals and the provided educational materials after my discharge from the hospital.  Patient/Caregiver Signature _______________________________ Date __________  Clinician Signature _______________________________________ Date __________  Please bring this form and your medication list with you to all your follow-up doctor's appointments.

## 2023-12-29 ENCOUNTER — Inpatient Hospital Stay (HOSPITAL_COMMUNITY)

## 2023-12-29 DIAGNOSIS — D62 Acute posthemorrhagic anemia: Secondary | ICD-10-CM | POA: Insufficient documentation

## 2023-12-29 DIAGNOSIS — K219 Gastro-esophageal reflux disease without esophagitis: Secondary | ICD-10-CM | POA: Insufficient documentation

## 2023-12-29 DIAGNOSIS — I10 Essential (primary) hypertension: Secondary | ICD-10-CM | POA: Diagnosis not present

## 2023-12-29 DIAGNOSIS — D72829 Elevated white blood cell count, unspecified: Secondary | ICD-10-CM | POA: Insufficient documentation

## 2023-12-29 DIAGNOSIS — F411 Generalized anxiety disorder: Secondary | ICD-10-CM | POA: Insufficient documentation

## 2023-12-29 DIAGNOSIS — K5900 Constipation, unspecified: Secondary | ICD-10-CM | POA: Diagnosis not present

## 2023-12-29 DIAGNOSIS — M79604 Pain in right leg: Secondary | ICD-10-CM | POA: Diagnosis not present

## 2023-12-29 DIAGNOSIS — R7989 Other specified abnormal findings of blood chemistry: Secondary | ICD-10-CM | POA: Insufficient documentation

## 2023-12-29 DIAGNOSIS — S82201S Unspecified fracture of shaft of right tibia, sequela: Secondary | ICD-10-CM | POA: Diagnosis not present

## 2023-12-29 LAB — CBC
HCT: 33.1 % — ABNORMAL LOW (ref 36.0–46.0)
Hemoglobin: 11.6 g/dL — ABNORMAL LOW (ref 12.0–15.0)
MCH: 34.2 pg — ABNORMAL HIGH (ref 26.0–34.0)
MCHC: 35 g/dL (ref 30.0–36.0)
MCV: 97.6 fL (ref 80.0–100.0)
Platelets: 340 10*3/uL (ref 150–400)
RBC: 3.39 MIL/uL — ABNORMAL LOW (ref 3.87–5.11)
RDW: 14.4 % (ref 11.5–15.5)
WBC: 9.5 10*3/uL (ref 4.0–10.5)
nRBC: 0 % (ref 0.0–0.2)

## 2023-12-29 LAB — BASIC METABOLIC PANEL WITH GFR
Anion gap: 11 (ref 5–15)
BUN: 20 mg/dL (ref 6–20)
CO2: 29 mmol/L (ref 22–32)
Calcium: 9.2 mg/dL (ref 8.9–10.3)
Chloride: 98 mmol/L (ref 98–111)
Creatinine, Ser: 0.76 mg/dL (ref 0.44–1.00)
GFR, Estimated: >60 mL/min (ref >60)
Glucose, Bld: 108 mg/dL — ABNORMAL HIGH (ref 70–99)
Potassium: 3.7 mmol/L (ref 3.5–5.1)
Sodium: 138 mmol/L (ref 135–145)

## 2023-12-29 MED ORDER — GABAPENTIN 300 MG PO CAPS
600.0000 mg | ORAL_CAPSULE | Freq: Three times a day (TID) | ORAL | Status: DC
  Administered 2023-12-29 – 2023-12-30 (×4): 600 mg via ORAL
  Filled 2023-12-29 (×4): qty 2

## 2023-12-29 NOTE — Plan of Care (Signed)
  Problem: Consults Goal: RH GENERAL PATIENT EDUCATION Description: See Patient Education module for education specifics. Outcome: Progressing   Problem: RH BOWEL ELIMINATION Goal: RH STG MANAGE BOWEL WITH ASSISTANCE Description: STG Manage Bowel with toileting Assistance. Outcome: Progressing   Problem: RH SAFETY Goal: RH STG ADHERE TO SAFETY PRECAUTIONS W/ASSISTANCE/DEVICE Description: STG Adhere to Safety Precautions With Assistance/Device. Outcome: Progressing   Problem: RH PAIN MANAGEMENT Goal: RH STG PAIN MANAGED AT OR BELOW PT'S PAIN GOAL Description: Pain < 4 with prns Outcome: Progressing

## 2023-12-29 NOTE — Progress Notes (Signed)
 Physical Therapy Session Note  Patient Details  Name: Annette Lucas MRN: [id number] Date of Birth: 02-Dec-1970  Today's Date: 12/29/2023 PT Individual Time: [phone] and 1300-1356 PT Individual Time Calculation (min): 42 min and 56 min  Short Term Goals: Week 1:  PT Short Term Goal 1 (Week 1): STG=LTG 2/2 ELOS  Skilled Therapeutic Interventions/Progress Updates:   Treatment Session 1 Received pt semi-reclined in bed, pt agreeable to PT treatment, and reported pain 8/10 in R knee radiating down to R ankle. Pt reporting new shooting pains and aching/throbbing along tibia and feeling more "unstable". Discussed that instability may be due to reduction in swelling - MD notified. Session with emphasis on functional mobility/transfers, toileting, generalized strengthening and endurance, and dynamic standing balance/coordination. Pt transferred supine<>sitting L EOB from flat bed independently. Donned L sock and shoe with max A and pt reported urge to void.  Pt performed all transfers with RW and supervision throughout session. Pt transferred bed<>bedside commode<>WC via stand<>pivot with RW with good adherence to RLE NWB precautions. Pt able to manage clothing without assist and void/perform hygiene management without assist. Sat in WC at sink and performed hand hygiene and brushed teeth independently while RN present to administer medications. Went through sensation, MMT, and pain interference questionnaire in preparation for discharge. Pt performed seated R knee extension 2x10 and R hip flexion 2x10 with emphasis on LE strength/ROM. Pt able to stand and pick up object from floor using reacher (using RUE) and supervision - pt reports already ordering reacher for home use. Concluded session with pt sitting in [facility] with all needs within reach. NT notified to provide ice pack for RLE.   Treatment Session 2 Received pt sitting in recliner with daughter present for family education training. Pt agreeable to PT  treatment and reported pain 7/10 in R knee radiating down to R ankle. Session with emphasis on discharge planning, functional mobility/transfers, generalized strengthening and endurance, dynamic standing balance/coordination, stair navigation, simulated car transfers, and gait training. Pt performed all transfers with RW and supervision/mod I throughout session.   Pt performed WC mobility 189ft using BUE and mod I to 4W stairwell with emphasis on BUE strength/endurance. Pt transferred onto shower chair and navigated 3 6in steps x 2 trials with R handrail and close supervision. Trial 1 with therapist and trial 2 with pt's daughter. Pt able to move shower chair up steps independently but required min A to move chair down steps. Pt performed simulated car transfer with RW and mod I (pt able to fold/unfold RW to get in/out of car). Pt then ambulated 22ft with RW and mod I using a "hop to" pattern with WC follow provided by daughter. Pt with good recall of cues to land softly on LLE.   Pt performed seated tricep extensions 3x10 on yoga blocks with emphasis on BUE strength. Transitioned to seated lateral trunk rotations with 6lb medicine ball 2x10 bilaterally, then overhead chest press<>horizontal chest press 2x10 each with 6lb medicine ball. Pt then performed standing L heel raises 3x10 and single leg mini squats 2x10 with emphasis on LE strength. PA, Pam, arrived for brief assessment. Returned to room and concluded session with pt sitting in [facility] with all needs within reach awaiting OT family education session.   Therapy Documentation Precautions:  Precautions Precautions: Fall Recall of Precautions/Restrictions: Intact Required Braces or Orthoses: Splint/Cast Splint/Cast: RLE Restrictions Weight Bearing Restrictions Per Provider Order: Yes RLE Weight Bearing Per Provider Order: Non weight bearing  Therapy/Group: Individual Therapy Marlana Salvage Zaunegger Tobi Bastos  Zaunegger PT, DPT 12/29/2023, 6:57 AM

## 2023-12-29 NOTE — Patient Care Conference (Signed)
 Inpatient RehabilitationTeam Conference and Plan of Care Update Date: 12/28/2023   Time: 11:42 AM    Patient Name: Annette Lucas      Medical Record Number: [id number]  Date of Birth: 08/22/1971 Sex: Female         Room/Bed: 4M08C/4M08C-01 Payor Info: Payor: [place] MEDICAID PREPAID HEALTH PLAN / Plan: [place] MEDICAID [employer] [place]  / Product Type: *No Product type* /    Admit Date/Time:  12/24/2023  2:41 PM  Primary Diagnosis:  Tibia/fibula fracture, right, sequela  Hospital Problems: Principal Problem:   Tibia/fibula fracture, right, sequela    Expected Discharge Date: Expected Discharge Date: 12/31/23  Team Members Present: Physician leading conference: Dr. Fanny Dance Social Worker Present: Cecile Sheerer, LCSWA Nurse Present: Chana Bode, RN PT Present: Blima Rich, PT OT Present: Candee Furbish, OT PPS Coordinator present : Fae Pippin, SLP     Current Status/Progress Goal Weekly Team Focus  Bowel/Bladder   Continet of bowel and bladder. LBM 4/1   Remain free from S/s of constipation   Offer toileting q 2 hours during day and q 4 at hs.    Swallow/Nutrition/ Hydration               ADL's   Set up A UB, CGA LB, min A toileting   Mod I   Barriers- pain, RLE NWB precautions, functional endurance, decreased balance strategies    Mobility   bed mobility supervision, transfers with RW CGA, gait ~65ft with RW CGA, 2 6in steps using 1 handrail and shower chair CGA (assist to manage chair)   Mod I  barriers: pain, RLE NWB precautions, 2 STE, decreased balance strategies    Communication                Safety/Cognition/ Behavioral Observations               Pain   C/O of pain to RLE   Pt states pain level <3 out of 10.   assess pain q shift and Prn. administer medication per orders    Skin   Cast to RLE. skin otherwise intact   free from s/s of infection.  assess q shift and prn.      Discharge Planning:  Pt will need to discharge  to home at an independent level as she reports her dtr can help more once she gets out of college in 2 weeks, and PRN support from her parents. SW will confirm there are no barriers to discharge.   Team Discussion: Patient admitted post right tibia/fibula fracture repair. Limited by poor endurance.  Patient on target to meet rehab goals: yes, patient needs set up for upper body care and min assist for toileting. Needs CGA for sit- stand and stand- pivot transfers.  Able to hop up to 69' using a RW.  Able to manage (2) 6" steps using a shower chair and CGA. Goals for discharge set for mod I overall.  *See Care Plan and progress notes for long and short-term goals.   Revisions to Treatment Plan:  N/a   Teaching Needs: Safety, medications, weight bearing precautions, transfers, toileting, etc.   Current Barriers to Discharge: Decreased caregiver support, Home enviroment access/layout, and Weight bearing restrictions  Possible Resolutions to Barriers: Family education OP follow up services once weight bearing restrictions lifted     Medical Summary Current Status: R tibia fracture, pain, HTN, constipation, neuropathy, low MG and potassium  Barriers to Discharge: Electrolyte abnormality;Self-care education;Medical stability;Uncontrolled Pain  Barriers to  Discharge Comments: R tibia fracture, pain, HTN, constipation, neuropathy, low MG and potassium Possible Resolutions to [employer]: scheduled pain medications, continue to monitor pain, monitor labs, monitor bowel function and BP   Continued Need for Acute Rehabilitation Level of Care: The patient requires daily medical management by a physician with specialized training in physical medicine and rehabilitation for the following reasons: Direction of a multidisciplinary physical rehabilitation program to maximize functional independence : Yes Medical management of patient stability for increased activity during participation in  an intensive rehabilitation regime.: Yes Analysis of laboratory values and/or radiology reports with any subsequent need for medication adjustment and/or medical intervention. : Yes   I attest that I was present, lead the team conference, and concur with the assessment and plan of the team.   Chana Bode B 12/29/2023, 2:40 PM

## 2023-12-29 NOTE — Progress Notes (Signed)
 Inpatient Rehabilitation Care Coordinator Discharge Note   Patient Details  Name: Annette Lucas MRN: [id number] Date of Birth: [birth year]   Discharge location: D/c to home  Length of Stay: 6 days  Discharge activity level: Limited  Home/community participation: Mod I at w/c level  Patient response YN:WGNFAO Literacy - How often do you need to have someone help you when you read instructions, pamphlets, or other written material from your doctor or pharmacy?: Never  Patient response ZH:YQMVHQ Isolation - How often do you feel lonely or isolated from those around you?: Never  Services provided included: MD, RD, PT, OT, RN, CM, Pharmacy, Neuropsych, SW, TR, SLP  Financial Services:  Field seismologist Utilized: Private Insurance [employer] [place]  Choices offered to/list presented to: patient  Follow-up services arranged:  Other (Comment) (Therapy deferred until WB status changes)           Patient response to transportation need: Is the patient able to respond to transportation needs?: Yes In the past 12 months, has lack of transportation kept you from medical appointments or from getting medications?: No In the past 12 months, has lack of transportation kept you from meetings, work, or from getting things needed for daily living?: No   Patient/Family verbalized understanding of follow-up arrangements:  Yes  Individual responsible for coordination of the follow-up plan: contact pt  Confirmed correct DME delivered: Gretchen Short 12/29/2023    Comments (or additional information):  Summary of Stay    Date/Time Discharge Planning CSW  12/28/23 1142 Pt will need to discharge to home at an independent level as she reports her dtr can help more once she gets out of college in 2 weeks, and PRN support from her parents. SW will confirm there are no barriers to discharge. AAC       Siarra Gilkerson A Lula Olszewski

## 2023-12-29 NOTE — Plan of Care (Signed)
  Problem: Consults Goal: RH GENERAL PATIENT EDUCATION Description: See Patient Education module for education specifics. Outcome: Progressing   Problem: RH BOWEL ELIMINATION Goal: RH STG MANAGE BOWEL WITH ASSISTANCE Description: STG Manage Bowel with toileting Assistance. Outcome: Progressing Goal: RH STG MANAGE BOWEL W/MEDICATION W/ASSISTANCE Description: STG Manage Bowel with Medication with mod I Assistance. Outcome: Progressing   Problem: RH SAFETY Goal: RH STG ADHERE TO SAFETY PRECAUTIONS W/ASSISTANCE/DEVICE Description: STG Adhere to Safety Precautions With Assistance/Device. Outcome: Progressing   Problem: RH PAIN MANAGEMENT Goal: RH STG PAIN MANAGED AT OR BELOW PT'S PAIN GOAL Description: Pain < 4 with prns Outcome: Progressing   Problem: RH KNOWLEDGE DEFICIT GENERAL Goal: RH STG INCREASE KNOWLEDGE OF SELF CARE AFTER HOSPITALIZATION Outcome: Progressing

## 2023-12-29 NOTE — Progress Notes (Signed)
 Physical Therapy Discharge Summary  Patient Details  Name: Annette Lucas MRN: [id number] Date of Birth: 13-Jan-1971  Date of Discharge from PT service:December 30, 2023  Patient has met 12 of 12 long term goals due to improved activity tolerance, improved balance, improved postural control, increased strength, increased range of motion, ability to compensate for deficits, and improved coordination.  Patient to discharge at a wheelchair level Modified Independent. Patient's care partner is independent to provide the necessary physical assistance at discharge. Pt's daughter attended family education training on 4/3 and verbalized and demonstrated confidence with all tasks to ensure safe discharge home.   All goals met  Recommendation:  Patient will benefit from ongoing skilled PT services in outpatient setting once WB restrictions have been lifted to continue to advance safe functional mobility, address ongoing impairments in transfers, generalized strengthening and endurance, dynamic standing balance/coordination, gait training, stair navigation, and to minimize fall risk.  Equipment: No equipment provided  Reasons for discharge: treatment goals met and discharge from hospital  Patient/family agrees with progress made and goals achieved: Yes  PT Discharge Precautions/Restrictions Precautions Precautions: Fall Recall of Precautions/Restrictions: Intact Required Braces or Orthoses: Splint/Cast Splint/Cast: RLE Restrictions Weight Bearing Restrictions Per Provider Order: Yes RLE Weight Bearing Per Provider Order: Non weight bearing Pain Interference Pain Interference Pain Effect on Sleep: 2. Occasionally Pain Interference with Therapy Activities: 2. Occasionally Pain Interference with Day-to-Day Activities: 1. Rarely or not at all Cognition Overall Cognitive Status: Within Functional Limits for tasks assessed Arousal/Alertness: Awake/alert Orientation Level: Oriented X4 Memory: Appears  intact Awareness: Appears intact Problem Solving: Appears intact Safety/Judgment: Appears intact Sensation Sensation Light Touch: Impaired by gross assessment Hot/Cold: Not tested Proprioception: Impaired by gross assessment Stereognosis: Not tested Additional Comments: baseline neuropathy in hands and feet at baseline. Limited by cast/splint on RLE. Coordination Gross Motor Movements are Fluid and Coordinated: No Fine Motor Movements are Fluid and Coordinated: Yes Coordination and Movement Description: altered balance strategies due to NWB precautions on RLE Finger Nose Finger Test: [facility] bilaterally Heel Shin Test: WFL on LLE, decreased ROM on RLE due to pain and "heaviness" Motor  Motor Motor: Other (comment) Motor - Skilled Clinical Observations: altered balance strategies due to RLE NWB precautions  Mobility Bed Mobility Bed Mobility: Rolling Right;Rolling Left;Sit to Supine;Supine to Sit Rolling Right: Independent Rolling Left: Independent Supine to Sit: Independent Sit to Supine: Independent Transfers Transfers: Sit to Stand;Stand to Sit;Stand Pivot Transfers Sit to Stand: Independent with assistive device Stand to Sit: Independent with assistive device Stand Pivot Transfers: Independent with assistive device Transfer (Assistive device): Rolling [place] Locomotion  Gait Ambulation: Yes Gait Assistance: Independent with assistive device Gait Distance (Feet): 30 Feet Assistive device: Rolling Hanser Gait Gait: Yes Gait Pattern: Impaired (hop to) Gait velocity: decreased Stairs / Additional Locomotion Stairs: Yes Stairs Assistance: Supervision/Verbal cueing Stair Management Technique: One rail Right;Other (comment) (shower chair) Number of Stairs: 3 Height of Stairs: 6 Wheelchair Mobility Wheelchair Mobility: Yes Wheelchair Assistance: Independent with Scientist, research (life sciences): Both upper extremities Wheelchair Parts Management:  Independent Distance: 165ft  Trunk/Postural Assessment  Cervical Assessment Cervical Assessment: Within Functional Limits Thoracic Assessment Thoracic Assessment: Within Functional Limits Lumbar Assessment Lumbar Assessment: Within Functional Limits Postural Control Postural Control: Deficits on evaluation (altered balance strategies due to RLE NWB precautions)  Balance Balance Balance Assessed: Yes Static Sitting Balance Static Sitting - Balance Support: No upper extremity supported;Feet supported Static Sitting - Level of Assistance: 7: Independent Dynamic Sitting Balance Dynamic Sitting - Balance Support: Feet supported;No  upper extremity supported Dynamic Sitting - Level of Assistance: 7: Independent Static Standing Balance Static Standing - Balance Support: During functional activity;Bilateral upper extremity supported (RW) Static Standing - Level of Assistance: 6: Modified independent (Device/Increase time) Dynamic Standing Balance Dynamic Standing - Balance Support: Bilateral upper extremity supported;During functional activity (RW) Dynamic Standing - Level of Assistance: 6: Modified independent (Device/Increase time) Extremity Assessment  RLE Assessment RLE Assessment: Exceptions to [facility] General Strength Comments: tested sitting in WC RLE Strength Right Hip Flexion: 3-/5 Right Hip ABduction: 3/5 Right Hip ADduction: 3/5 Right Knee Flexion: 3-/5 Right Knee Extension: 3/5 LLE Assessment LLE Assessment: Exceptions to [facility] General Strength Comments: tested sitting in WC LLE Strength Left Hip Flexion: 4/5 Left Hip ABduction: 4/5 Left Hip ADduction: 4/5 Left Knee Flexion: 4-/5 Left Knee Extension: 4/5 Left Ankle Dorsiflexion: 4+/5 Left Ankle Plantar Flexion: 4+/5  Lucio Edward, PT  Marlana Salvage Zaunegger Blima Rich PT, DPT 12/29/2023, 7:27 AM

## 2023-12-29 NOTE — Progress Notes (Addendum)
 Occupational Therapy Session Note  Patient Details  Name: Annette Lucas MRN: [id number] Date of Birth: 03-Feb-1971  Today's Date: 12/29/2023 OT Individual Time: 4098-1191 OT Individual Time Calculation (min): 45 min    Short Term Goals: Week 1:  OT Short Term Goal 1 (Week 1): STG=LTG d/t ELOS   Skilled Therapeutic Interventions/Progress Updates:    1:1 Pt received in the w/c. No reports of pain in session Pt ambulated on the way to the main gym about ~ 45 feet twice with a seated rest break in between. In the gym focus on overall strengthening. Pt performed knee raises, straight leg raises, hip circles in sitting (Bilaterally). Then stood at the parallel bars and perform hip adduction/abduction on the right and hip extension (kicks behind her) with right LE. Also perform diagonal/ cross body movements with 3 lb ball as well as triceps pulls over head with ball.  Pt self propelled back to room. Transferred into recliner; sitting on w/c cushion and with kpad behind her and her things within reach.  2nd session: 14:05-15:00 Pt with pain in lower back and neuropathy in hands - saw RN for meds and hot pack given at d/c   fam education with daughter: discussion about shower situation and using tub bench to access shower and entering and exiting bathroom with RW. Discussed wrapping LE v purchasing a  leg bag to cover Le. Discussed w/c parts with daughter and how to breakdown/ fold wheelchair and adjust anti tippers if need to access house over the front threshold. Discussed access to kitchen and work setup. Pt propelled from room on the way to outside - to [employer].  Out side continued to practice walking/hopping on left Le ~ >70 feet with RW before needing to sit. Discussed follow therapist after pt able to weight bear through LE. Discussed transition home from hospital to home and safety at home in environment and when family needs to come and assist. Returned to the room and pt left sitting up in  recliner with hot pack on her lower back.  Daughter present at bedside.   Therapy Documentation Precautions:  Precautions Precautions: Fall Recall of Precautions/Restrictions: Intact Required Braces or Orthoses: Splint/Cast Splint/Cast: RLE Restrictions Weight Bearing Restrictions Per Provider Order: Yes RLE Weight Bearing Per Provider Order: Non weight bearing     Therapy/Group: Individual Therapy  Roney Mans [facility] 12/29/2023, 12:44 PM

## 2023-12-29 NOTE — Progress Notes (Signed)
 Increased gabapentin to home dose of 600 mg which should help with pain/neuropathy. Reached out to [place] PA w/ortho who recommended replacing splint as now lose due to resolving edema. Xray without interval change.

## 2023-12-29 NOTE — Progress Notes (Signed)
 Orthopedic Tech Progress Note Patient Details:  Annette Lucas [birth year] [id number]  Initial splint was removed and another well-padded plaster short leg splint was applied. The mepitel dressings were left in place over the incisions and dressed with clean gauze. Incisions were dry with no active drainage or redness noted. Motion and sensation of her toes remain intact.   Ortho Devices Type of Ortho Device: Post (short leg) splint, Stirrup splint, Cotton web roll Ortho Device/Splint Location: RLE Ortho Device/Splint Interventions: Ordered, Application, Adjustment   Post Interventions Patient Tolerated: Well Instructions Provided: Care of device, Adjustment of device  [name] Carmine Savoy 12/29/2023, 5:22 PM

## 2023-12-29 NOTE — Progress Notes (Addendum)
 PROGRESS NOTE   Subjective/Complaints: Patient reports some worsening pain in her left ankle.  She also feels like the swelling has gone down a little bit in her left foot.  Pain still overall controlled with medications.  No additional concerns  ROS: Patient denies fever, dizziness, nausea, vomiting, diarrhea,  shortness of breath or chest pain, headache, or mood change.  No new motor or sensory changes. Left ankle pain-a little worsened today  Objective:   No results found. Recent Labs    12/29/23 0607  WBC 9.5  HGB 11.6*  HCT 33.1*  PLT 340   Recent Labs    12/29/23 0607  NA 138  K 3.7  CL 98  CO2 29  GLUCOSE 108*  BUN 20  CREATININE 0.76  CALCIUM 9.2         Intake/Output Summary (Last 24 hours) at 12/29/2023 1436 Last data filed at 12/29/2023 1610 Gross per 24 hour  Intake 240 ml  Output --  Net 240 ml         Physical Exam: Vital Signs Blood pressure 114/72, pulse 88, temperature 98.7 F (37.1 C), temperature source Oral, resp. rate 16, height 5\' 4"  (1.626 m), weight 68.1 kg, last menstrual period 06/14/2011, SpO2 97%.  Constitution: Appropriate appearance for age. No apparent distress. Sitting in WC, appears comfortable Resp: No respiratory distress. No accessory muscle usage. on RA and CTAB Cardio: Well perfused appearance. No peripheral edema in LLE, unable to assess in RLE.  Brisk capillary refill bilaterally. RRR, no m/r/g appreciated Abdomen: Soft, nontender, nondistended, positive bowel sounds Psych: Appropriate mood and affect. MSK: Right lower extremity cast.  Full active range of motion all other extremities  Neuro: AAOx4. No apparent cognitive deficits.  Bilateral upper lower extremity stocking glove pattern peripheral neuropathy. 5 out of 5 strength in bilateral upper extremities and left lower extremity; right lower extremity 4 out of 5 hip flexion, can wiggle toes antigravity and  against resistance Exam stable 4/3     Assessment/Plan: 1. Functional deficits which require 3+ hours per day of interdisciplinary therapy in a comprehensive inpatient rehab setting. Physiatrist is providing close team supervision and 24 hour management of active medical problems listed below. Physiatrist and rehab team continue to assess barriers to discharge/monitor patient progress toward functional and medical goals  Care Tool:  Bathing    Body parts bathed by patient: Right arm, Left arm, Chest, Abdomen, Front perineal area, Buttocks, Right upper leg, Left upper leg, Left lower leg, Face     Body parts n/a: Right lower leg (casted)   Bathing assist Assist Level: Set up assist     Upper Body Dressing/Undressing Upper body dressing   What is the patient wearing?: Pull over shirt    Upper body assist Assist Level: Set up assist    Lower Body Dressing/Undressing Lower body dressing      What is the patient wearing?: Underwear/pull up     Lower body assist Assist for lower body dressing: Supervision/Verbal cueing     Toileting Toileting    Toileting assist Assist for toileting: Minimal Assistance - Patient > 75%     Transfers Chair/bed transfer  Transfers assist  Chair/bed transfer assist level: Independent with assistive device Chair/bed transfer assistive device: Geologist, engineering   Ambulation assist      Assist level: Independent with assistive device Assistive device: Staszak-rolling Max distance: 30   Walk 10 feet activity   Assist     Assist level: Independent with assistive device Assistive device: Hettinger-rolling   Walk 50 feet activity   Assist Walk 50 feet with 2 turns activity did not occur: Safety/medical concerns (pain/fatigue)         Walk 150 feet activity   Assist Walk 150 feet activity did not occur: Safety/medical concerns         Walk 10 feet on uneven surface  activity   Assist Walk 10  feet on uneven surfaces activity did not occur: Safety/medical concerns         Wheelchair     Assist Is the patient using a wheelchair?: Yes Type of Wheelchair: Manual    Wheelchair assist level: Independent Max wheelchair distance: 150+    Wheelchair 50 feet with 2 turns activity    Assist        Assist Level: Independent   Wheelchair 150 feet activity     Assist      Assist Level: Independent   Blood pressure 114/72, pulse 88, temperature 98.7 F (37.1 C), temperature source Oral, resp. rate 16, height 5\' 4"  (1.626 m), weight 68.1 kg, last menstrual period 06/14/2011, SpO2 97%.  Medical Problem List and Plan: 1. Functional deficits secondary to right tibia fracture status post IM nail             -patient may not shower unless cast is totally covered             -ELOS/Goals: 7 to 10 days, modified independent PT/OT -Continue CIR -Expected discharge 4/5 -Will consider asking orthopedics to rewrap cast  2.  Antithrombotics: -DVT/anticoagulation:  Pharmaceutical: Lovenox 40mg  daily             -antiplatelet therapy: N/a 3. Pain Management:  Oxycodone prn. Continue robaxin 500 mg QID and tylenol 650 mg qid prn.             --ice/heat prn -12/25/23 pain controlled but suggested she monitor the timing of doses particularly at night so she can try to improve her sleep a bit/have better pain control overnight; having spasms she says, may need to consider switching to other muscle relaxant but for now leave robaxin. Kpad ordered. -3/31 and reports pain overall controlled and improving, continues to intermittently use oxycodone 4/1 schedule oxycodone 5 mg at 7 AM before therapy  4/2 continue current pain management for now and monitor 4/3 continue current medications, check tib-fib x-ray 4. Mood/Behavior/Sleep: LCSW to follow for evaluation and support.              -antipsychotic agents: N/A  -Melatonin PRN  -Buspar 5mg  BID 5. Neuropsych/cognition: This  patient is capable of making decisions on her own behalf. 6. Skin/Wound Care: Routine pressure relief measures.  7. Fluids/Electrolytes/Nutrition: Monitor I/O. Continue vitamins. Routine labs  -12/25/23 CMP yesterday basically unremarkable, Mg today WNL.  8. 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 B12 level WNL 432 on 3/30   9. Peripheral edema/HTN: Monitor BP TID. Continue Demadex 20mg  bid.   -4/1 BP stable continue to monitor  -4/3 BP control Vitals:   12/25/23 0502 12/25/23 1633 12/25/23 1929 12/26/23 0448  BP: 107/64 113/63 115/66 110/61   12/26/23  1607 12/26/23 1937 12/27/23 0458 12/27/23 1418  BP: 135/72 (!) 132/59 100/62 128/74   12/27/23 1826 12/28/23 1849 12/29/23 0518 12/29/23 1243  BP: 122/75 111/69 118/75 114/72    10. Anxiety d/o?: On buspar 5mg  BID 11. Hypokalemia: Will check daily and supplement aggressively (currently BID)  -12/25/23 K 4.0 yesterday, monitor  -3/31 potassium stable at 3.9, continue current regimen  -4/3 potassium 3.7 today, continue supplementation 12. Hypomagnesia: Recheck in am and supplement which will help K+ also  -12/25/23 Mg 1.7 today, monitor 13. Peripheral neuropathy: Continue gabapentin 300mg  TID. --Was on high dose B12 PTA-->-check level in am> WNL 432 on 12/25/23 14. GERD: stable on Protonix 40mg  daily 15. Low normal Vitamin D level 38.13 on 3/27: Add ergocalciferol 50,000U weekly for supplement.  16.  Constipation.  Last bowel movement prior to admission.  Daily MiraLAX, Senokot-S 2 tabs twice daily, and as needed sorbitol started on admission. -12/25/23 still no BM, added colace 100mg  daily, increased miralax to BID, Mg Citrate PRN if no BM later, monitor   3/31 improved after BM today and yesterday 4/3 LBM today    LOS: 5 days A FACE TO FACE EVALUATION WAS PERFORMED  Fanny Dance 12/29/2023, 2:36 PM

## 2023-12-30 ENCOUNTER — Other Ambulatory Visit (HOSPITAL_COMMUNITY): Payer: Self-pay

## 2023-12-30 DIAGNOSIS — K5900 Constipation, unspecified: Secondary | ICD-10-CM | POA: Diagnosis not present

## 2023-12-30 DIAGNOSIS — I10 Essential (primary) hypertension: Secondary | ICD-10-CM | POA: Diagnosis not present

## 2023-12-30 DIAGNOSIS — M792 Neuralgia and neuritis, unspecified: Secondary | ICD-10-CM

## 2023-12-30 DIAGNOSIS — S82201S Unspecified fracture of shaft of right tibia, sequela: Secondary | ICD-10-CM | POA: Diagnosis not present

## 2023-12-30 MED ORDER — ASPIRIN 325 MG PO TBEC
325.0000 mg | DELAYED_RELEASE_TABLET | Freq: Every day | ORAL | 0 refills | Status: AC
  Filled 2023-12-30: qty 30, 30d supply, fill #0

## 2023-12-30 MED ORDER — DOCUSATE SODIUM 100 MG PO CAPS
100.0000 mg | ORAL_CAPSULE | Freq: Every day | ORAL | 0 refills | Status: AC
  Filled 2023-12-30: qty 30, 30d supply, fill #0

## 2023-12-30 MED ORDER — METHOCARBAMOL 500 MG PO TABS
500.0000 mg | ORAL_TABLET | Freq: Four times a day (QID) | ORAL | 0 refills | Status: AC
  Filled 2023-12-30: qty 120, 30d supply, fill #0

## 2023-12-30 MED ORDER — MELATONIN 5 MG PO TABS: 5.0000 mg | ORAL_TABLET | Freq: Every evening | ORAL | Status: AC | PRN

## 2023-12-30 MED ORDER — POTASSIUM CHLORIDE CRYS ER 20 MEQ PO TBCR
20.0000 meq | EXTENDED_RELEASE_TABLET | Freq: Every morning | ORAL | 0 refills | Status: AC
  Filled 2023-12-30: qty 30, 30d supply, fill #0

## 2023-12-30 MED ORDER — ACETAMINOPHEN 325 MG PO TABS: 650.0000 mg | ORAL_TABLET | Freq: Four times a day (QID) | ORAL | Status: AC | PRN

## 2023-12-30 MED ORDER — SENNOSIDES-DOCUSATE SODIUM 8.6-50 MG PO TABS
2.0000 | ORAL_TABLET | Freq: Two times a day (BID) | ORAL | 0 refills | Status: AC
  Filled 2023-12-30: qty 120, 30d supply, fill #0

## 2023-12-30 MED ORDER — GABAPENTIN 300 MG PO CAPS
600.0000 mg | ORAL_CAPSULE | Freq: Four times a day (QID) | ORAL | Status: DC
  Administered 2023-12-30 – 2023-12-31 (×2): 600 mg via ORAL
  Filled 2023-12-30 (×2): qty 2

## 2023-12-30 MED ORDER — VITAMIN D (ERGOCALCIFEROL) 1.25 MG (50000 UNIT) PO CAPS
50000.0000 [IU] | ORAL_CAPSULE | ORAL | 0 refills | Status: AC
  Filled 2023-12-30: qty 5, 35d supply, fill #0

## 2023-12-30 MED ORDER — OXYCODONE HCL 5 MG PO TABS
5.0000 mg | ORAL_TABLET | Freq: Three times a day (TID) | ORAL | 0 refills | Status: AC | PRN
  Filled 2023-12-30: qty 20, 5d supply, fill #0

## 2023-12-30 NOTE — Progress Notes (Signed)
 Occupational Therapy Discharge Summary  Patient Details  Name: Annette Lucas MRN: [id number] Date of Birth: 1970/11/24  Date of Discharge from OT service:December 30, 2023   Patient has met 9 of 9 long term goals due to improved activity tolerance, improved balance, ability to compensate for deficits, and improved coordination.  Patient to discharge at overall Modified Independent level.  Patient's care partner is independent to provide the necessary physical assistance at discharge. Pt's daughter was present to observe pt's CLOF and completed education with OT.  All goals met  Recommendation:  No follow up at this time  Equipment: No equipment provided  Reasons for discharge: treatment goals met  Patient/family agrees with progress made and goals achieved: Yes  OT Discharge Precautions/Restrictions  Precautions Precautions: Fall Required Braces or Orthoses: Splint/Cast Splint/Cast: RLE Restrictions Weight Bearing Restrictions Per Provider Order: Yes RLE Weight Bearing Per Provider Order: Non weight bearing ADL ADL Equipment Provided: Reacher Eating: Independent Where Assessed-Eating: Wheelchair Grooming: Independent Where Assessed-Grooming: Sitting at sink Upper Body Bathing: Independent Where Assessed-Upper Body Bathing: Shower Lower Body Bathing: Modified independent Where Assessed-Lower Body Bathing: Shower Upper Body Dressing: Modified independent (Device) Where Assessed-Upper Body Dressing: Sitting at sink Lower Body Dressing: Modified independent Where Assessed-Lower Body Dressing: Sitting at sink, Standing at sink Toileting: Modified independent Where Assessed-Toileting: Teacher, adult education: Modified Community education officer Method: Surveyor, minerals: Raised toilet seat, Other (comment) (RW) Tub/Shower Transfer: Not assessed Tub/Shower Transfer Method: Stand pivot Tub/Shower Equipment: Emergency planning/management officer, Walk in Psychologist, educational Transfer: Modified independent Film/video editor Method: Warden/ranger: Emergency planning/management officer ADL Comments: Pt benefits from AE for increased independence with LB dressing. Pt having trouble with taking one hand off to manage pants over waist furing LB dressing dt decreased balance strategies. Vision Baseline Vision/History: 1 Wears glasses Patient Visual Report: No change from baseline Vision Assessment?: No apparent visual deficits Perception  Perception: Within Functional Limits Praxis Praxis: WFL Cognition Cognition Overall Cognitive Status: Within Functional Limits for tasks assessed Arousal/Alertness: Awake/alert Orientation Level: Person;Place;Situation Person: Oriented Place: Oriented Situation: Oriented Memory: Appears intact Awareness: Appears intact Problem Solving: Appears intact Safety/Judgment: Appears intact Brief Interview for Mental Status (BIMS) Repetition of Three Words (First Attempt): 3 Temporal Orientation: Year: Correct Temporal Orientation: Month: Accurate within 5 days Temporal Orientation: Day: Correct Recall: "Sock": Yes, no cue required Recall: "Blue": Yes, no cue required Recall: "Bed": Yes, no cue required BIMS Summary Score: 15 Sensation Sensation Light Touch: Impaired by gross assessment Hot/Cold: Not tested Proprioception: Impaired by gross assessment Stereognosis: Not tested Additional Comments: baseline neuropathy in hands and feet at baseline. Limited by cast/splint on RLE. Coordination Gross Motor Movements are Fluid and Coordinated: No Fine Motor Movements are Fluid and Coordinated: Yes Coordination and Movement Description: altered balance strategies due to NWB precautions on RLE Finger Nose Finger Test: [facility] bilaterally Heel Shin Test: WFL on LLE, decreased ROM on RLE due to pain and "heaviness" Motor  Motor Motor: Other (comment) Motor - Skilled Clinical Observations: altered balance strategies  due to RLE NWB precautions Mobility  Bed Mobility Bed Mobility: Rolling Right;Rolling Left;Sit to Supine;Supine to Sit Rolling Right: Independent Rolling Left: Independent Supine to Sit: Independent Sit to Supine: Independent Transfers Sit to Stand: Independent with assistive device Stand to Sit: Independent with assistive device  Trunk/Postural Assessment  Cervical Assessment Cervical Assessment: Within Functional Limits Thoracic Assessment Thoracic Assessment: Within Functional Limits Lumbar Assessment Lumbar Assessment: Within Functional Limits Postural Control Postural Control: Deficits  on evaluation (altered balance strategies due to RLE NWB precautions)  Balance Balance Balance Assessed: Yes Static Sitting Balance Static Sitting - Balance Support: No upper extremity supported;Feet supported Static Sitting - Level of Assistance: 7: Independent Dynamic Sitting Balance Dynamic Sitting - Balance Support: Feet supported;No upper extremity supported Dynamic Sitting - Level of Assistance: 7: Independent Static Standing Balance Static Standing - Balance Support: During functional activity;Bilateral upper extremity supported (RW) Static Standing - Level of Assistance: 6: Modified independent (Device/Increase time) Dynamic Standing Balance Dynamic Standing - Balance Support: Bilateral upper extremity supported;During functional activity (RW) Dynamic Standing - Level of Assistance: 6: Modified independent (Device/Increase time) Extremity/Trunk Assessment RUE Assessment RUE Assessment: Within Functional Limits LUE Assessment LUE Assessment: Within Functional Limits   Copeland Lapier E Eriverto Byrnes, MS, OTR/L  12/30/2023, 3:40 PM

## 2023-12-30 NOTE — Progress Notes (Addendum)
 PROGRESS NOTE   Subjective/Complaints: Spint redone yesterday, a little area on heel where she thinks splint came apart a little.  She reports some tingling in her hands.   ROS: Patient denies fever, dizziness, nausea, vomiting, diarrhea,  shortness of breath or chest pain, headache, or mood change.  No new motor or sensory changes. Left ankle pain-improved today  Objective:   DG Tibia/Fibula Right Result Date: 12/29/2023 CLINICAL DATA:  Postop ORIF EXAM: RIGHT TIBIA AND FIBULA - 2 VIEW COMPARISON:  12/21/2023 FINDINGS: Overlapping cast in place obscures underlying bone detail. Stable appearance of long intramedullary tibial rod with proximal and distal fixation screws, 2 each. This transfixes the distal tibial metaphyseal comminuted fracture. Near anatomic alignment. There is an adjacent comminuted mildly displaced fibular metaphyseal fracture which has similar appearance to previous examination. No additional fracture or dislocation. Preserved adjacent joint spaces. Imaging was obtained to aid in treatment. IMPRESSION: No significant interval change. Electronically Signed   By: Karen Kays M.D.   On: 12/29/2023 14:39   Recent Labs    12/29/23 0607  WBC 9.5  HGB 11.6*  HCT 33.1*  PLT 340   Recent Labs    12/29/23 0607  NA 138  K 3.7  CL 98  CO2 29  GLUCOSE 108*  BUN 20  CREATININE 0.76  CALCIUM 9.2         Intake/Output Summary (Last 24 hours) at 12/30/2023 1745 Last data filed at 12/30/2023 1341 Gross per 24 hour  Intake 712 ml  Output --  Net 712 ml         Physical Exam: Vital Signs Blood pressure (!) 142/75, pulse 89, temperature 98 F (36.7 C), temperature source Oral, resp. rate 16, height 5\' 4"  (1.626 m), weight 68.1 kg, last menstrual period 06/14/2011, SpO2 98%.  Constitution: Appropriate appearance for age. No apparent distress. Sitting in WC, appears comfortable Resp: No respiratory distress. No  accessory muscle usage. on RA and CTAB Cardio: Well perfused appearance. No peripheral edema in LLE, unable to assess in RLE.  Brisk capillary refill bilaterally. RRR, no m/r/g appreciated Abdomen: Soft, nontender, nondistended, positive bowel sounds Psych: Appropriate mood and affect. MSK: Right lower extremity cast.  Full active range of motion all other extremities  Neuro: AAOx4. No apparent cognitive deficits.  Bilateral upper lower extremity stocking glove pattern peripheral neuropathy. 5 out of 5 strength in bilateral upper extremities and left lower extremity; right lower extremity 4 out of 5 hip flexion, can wiggle toes antigravity and against resistance Exam stable 4/3     Assessment/Plan: 1. Functional deficits which require 3+ hours per day of interdisciplinary therapy in a comprehensive inpatient rehab setting. Physiatrist is providing close team supervision and 24 hour management of active medical problems listed below. Physiatrist and rehab team continue to assess barriers to discharge/monitor patient progress toward functional and medical goals  Care Tool:  Bathing    Body parts bathed by patient: Right arm, Left arm, Chest, Abdomen, Front perineal area, Buttocks, Right upper leg, Left upper leg, Left lower leg, Face     Body parts n/a: Right lower leg (cast)   Bathing assist Assist Level: Independent with assistive device  Upper Body Dressing/Undressing Upper body dressing   What is the patient wearing?: Pull over shirt    Upper body assist Assist Level: Independent with assistive device    Lower Body Dressing/Undressing Lower body dressing      What is the patient wearing?: Underwear/pull up, Pants     Lower body assist Assist for lower body dressing: Independent with assitive device     Toileting Toileting    Toileting assist Assist for toileting: Independent with assistive device     Transfers Chair/bed transfer  Transfers assist      Chair/bed transfer assist level: Independent with assistive device Chair/bed transfer assistive device: Geologist, engineering   Ambulation assist      Assist level: Independent with assistive device Assistive device: Heckler-rolling Max distance: 50   Walk 10 feet activity   Assist     Assist level: Independent with assistive device Assistive device: [place]duffee-rolling   Walk 50 feet activity   Assist Walk 50 feet with 2 turns activity did not occur: Safety/medical concerns         Walk 150 feet activity   Assist Walk 150 feet activity did not occur: Safety/medical concerns         Walk 10 feet on uneven surface  activity   Assist Walk 10 feet on uneven surfaces activity did not occur: Safety/medical concerns   Assist level: Supervision/Verbal cueing Assistive device: Colasanti-rolling   Wheelchair     Assist Is the patient using a wheelchair?: Yes Type of Wheelchair: Manual    Wheelchair assist level: Independent Max wheelchair distance: 150+    Wheelchair 50 feet with 2 turns activity    Assist        Assist Level: Independent   Wheelchair 150 feet activity     Assist      Assist Level: Independent   Blood pressure (!) 142/75, pulse 89, temperature 98 F (36.7 C), temperature source Oral, resp. rate 16, height 5\' 4"  (1.626 m), weight 68.1 kg, last menstrual period 06/14/2011, SpO2 98%.  Medical Problem List and Plan: 1. Functional deficits secondary to right tibia fracture status post IM nail             -patient may not shower unless cast is totally covered             -ELOS/Goals: 7 to 10 days, modified independent PT/OT -Continue CIR -Expected discharge 4/5 -Will consider asking orthopedics to rewrap cast -Will ask ortho to check on splint - adjust heal area  2.  Antithrombotics: -DVT/anticoagulation:  Pharmaceutical: Lovenox 40mg  daily             -antiplatelet therapy: N/a 3. Pain Management:  Oxycodone  prn. Continue robaxin 500 mg QID and tylenol 650 mg qid prn.             --ice/heat prn -12/25/23 pain controlled but suggested she monitor the timing of doses particularly at night so she can try to improve her sleep a bit/have better pain control overnight; having spasms she says, may need to consider switching to other muscle relaxant but for now leave robaxin. Kpad ordered. -3/31 and reports pain overall controlled and improving, continues to intermittently use oxycodone 4/1 schedule oxycodone 5 mg at 7 AM before therapy  4/2 continue current pain management for now and monitor 4/3 continue current medications, check tib-fib x-ray-stable 4/4 Gabapentin increased to QID for neuropathic pain 4. Mood/Behavior/Sleep: LCSW to follow for evaluation and support.              -  antipsychotic agents: N/A  -Melatonin PRN  -Buspar 5mg  BID 5. Neuropsych/cognition: This patient is capable of making decisions on her own behalf. 6. Skin/Wound Care: Routine pressure relief measures.  7. Fluids/Electrolytes/Nutrition: Monitor I/O. Continue vitamins. Routine labs  -12/25/23 CMP yesterday basically unremarkable, Mg today WNL.  8. 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 B12 level WNL 432 on 3/30   9. Peripheral edema/HTN: Monitor BP TID. Continue Demadex 20mg  bid.   -4/1 BP stable continue to monitor  -4/4 BP controlled continue to monitor Vitals:   12/26/23 0448 12/26/23 1607 12/26/23 1937 12/27/23 0458  BP: 110/61 135/72 (!) 132/59 100/62   12/27/23 1418 12/27/23 1826 12/28/23 1849 12/29/23 0518  BP: 128/74 122/75 111/69 118/75   12/29/23 1243 12/29/23 1908 12/30/23 0354 12/30/23 1349  BP: 114/72 123/74 119/65 (!) 142/75    10. Anxiety d/o?: On buspar 5mg  BID 11. Hypokalemia: Will check daily and supplement aggressively (currently BID)  -12/25/23 K 4.0 yesterday, monitor  -3/31 potassium stable at 3.9, continue current regimen  -4/3 potassium 3.7  today, continue supplementation 12. Hypomagnesia: Recheck in am and supplement which will help K+ also  -12/25/23 Mg 1.7 today, monitor 13. Peripheral neuropathy: Continue gabapentin 300mg  TID. --Was on high dose B12 PTA-->-check level in am> WNL 432 on 12/25/23 14. GERD: stable on Protonix 40mg  daily 15. Low normal Vitamin D level 38.13 on 3/27: Add ergocalciferol 50,000U weekly for supplement.  16.  Constipation.  Last bowel movement prior to admission.  Daily MiraLAX, Senokot-S 2 tabs twice daily, and as needed sorbitol started on admission. -12/25/23 still no BM, added colace 100mg  daily, increased miralax to BID, Mg Citrate PRN if no BM later, monitor   3/31 improved after BM today and yesterday 4/4 LBM yesterday, continue to monitor     LOS: 6 days A FACE TO FACE EVALUATION WAS PERFORMED  Fanny Dance 12/30/2023, 5:45 PM

## 2023-12-30 NOTE — Plan of Care (Signed)
  Problem: RH Balance Goal: LTG Patient will maintain dynamic standing with ADLs (OT) Description: LTG:  Patient will maintain dynamic standing balance with assist during activities of daily living (OT)  Outcome: Completed/Met   Problem: RH Grooming Goal: LTG Patient will perform grooming w/assist,cues/equip (OT) Description: LTG: Patient will perform grooming with assist, with/without cues using equipment (OT) Outcome: Completed/Met   Problem: RH Bathing Goal: LTG Patient will bathe all body parts with assist levels (OT) Description: LTG: Patient will bathe all body parts with assist levels (OT) Outcome: Completed/Met   Problem: RH Dressing Goal: LTG Patient will perform upper body dressing (OT) Description: LTG Patient will perform upper body dressing with assist, with/without cues (OT). Outcome: Completed/Met Goal: LTG Patient will perform lower body dressing w/assist (OT) Description: LTG: Patient will perform lower body dressing with assist, with/without cues in positioning using equipment (OT) Outcome: Completed/Met   Problem: RH Toileting Goal: LTG Patient will perform toileting task (3/3 steps) with assistance level (OT) Description: LTG: Patient will perform toileting task (3/3 steps) with assistance level (OT)  Outcome: Completed/Met   Problem: RH Light Housekeeping Goal: LTG Patient will perform light housekeeping w/assist (OT) Description: LTG: Patient will perform light housekeeping with assistance, with/without cues (OT). Outcome: Completed/Met   Problem: RH Toilet Transfers Goal: LTG Patient will perform toilet transfers w/assist (OT) Description: LTG: Patient will perform toilet transfers with assist, with/without cues using equipment (OT) Outcome: Completed/Met   Problem: RH Tub/Shower Transfers Goal: LTG Patient will perform tub/shower transfers w/assist (OT) Description: LTG: Patient will perform tub/shower transfers with assist, with/without cues using  equipment (OT) Outcome: Completed/Met

## 2023-12-30 NOTE — Progress Notes (Signed)
 Orthopedic Tech Progress Note Patient Details:  Annette Lucas 1971/08/07 [id number]  Ortho Devices Type of Ortho Device: Post (short leg) splint, Stirrup splint, Cotton web roll Ortho Device/Splint Location: Rewrapped existing splint. Ortho Device/Splint Interventions: Ordered, Application   Post Interventions Patient Tolerated: Well Instructions Provided: Care of device, Adjustment of device  Tonye Pearson 12/30/2023, 6:58 PM

## 2023-12-30 NOTE — Plan of Care (Signed)

## 2023-12-30 NOTE — Progress Notes (Signed)
 Occupational Therapy Session Note  Patient Details  Name: Annette Lucas MRN: [id number] Date of Birth: 1971/03/31  Today's Date: 12/30/2023 OT Individual Time: 0850-1000 & 1450-1530 OT Individual Time Calculation (min): 70 min & 40 min   Short Term Goals: Week 1:  OT Short Term Goal 1 (Week 1): STG=LTG d/t ELOS  Skilled Therapeutic Interventions/Progress Updates:  Session 1 Skilled OT intervention completed with focus on ADL Retraining, activity tolerance, functional transfers, dynamic standing balance. Pt received upright in bed, agreeable to session. 5/10 pain reported in R knee; pre-medicated. OT offered rest breaks, repositioning throughout for pain reduction.  Per pt request, OT re-adjusted RLE ACE wrap on cast due to it sliding up and becoming tangled over night for integrity of the guaze underneath.  Transitioned to EOB with mod I, and completed all sit > stands, stand pivot transfers and ambulatory transfers using RW with mod I during session. Ambulated > BSC over toilet (needed for height), then mod I for toileting steps for continent urinary void. Discussed ways she could prop RLE for comfort during sitting on toilet as pt requires this method here. Ambulated > sink.  Completed sponge bathing for the "hot spots" and dressing with mod I including sit > stand at sink using RW for periareas and LB dressing with reacher. Assist needed to tie R shoe lace.  Self-propelled in w/c > 300 ft x2 with mod I <> gym. Pt participated in the following dynamic standing balance and endurance tasks to promote independence and safety during BADLs and functional mobility: -cornhole toss activity. Mod I sit > stand with RW, and supervision needed for balance. No LOB   Pt remained seated in w/c, with chair alarm on/activated, and with all needs in reach at end of session.  Session 2 Skilled OT intervention completed with focus on activity tolerance, and practice with HEP for full body exercises to  complete at DC. Pt received seated in recliner, agreeable to session. No pain reported.  Completed all sit > stands, stand pivots and ambulatory transfers with mod I using RW during session. Ambulated > w/c. Transported dependently in w/c <> gym for time management.  OT created an HEP for stretches and exercises pt can continue at DC to improve functional mobility and health, including the following: (Supine) -single knee to chest stretch -straight leg raise -hip ab/adduction -knee flexion (Sitting) -lateral cervical flexion -scapular retraction -LAQ -marching (Standing) -single leg march -knee flexion  Pt completed the following x15 during session (Sitting) -lateral cervical flexion -scapular retraction -LAQ -marching  Back in room, pt transferred back to recliner, and remained seated in recliner, with chair alarm on/activated, and with all needs in reach at end of session.   Therapy Documentation Precautions:  Precautions Precautions: Fall Recall of Precautions/Restrictions: Intact Required Braces or Orthoses: Splint/Cast Splint/Cast: RLE Restrictions Weight Bearing Restrictions Per Provider Order: Yes RLE Weight Bearing Per Provider Order: Non weight bearing    Therapy/Group: Individual Therapy  Melvyn Novas, MS, OTR/L  12/30/2023, 3:40 PM

## 2023-12-30 NOTE — Plan of Care (Signed)
  Problem: Consults Goal: RH GENERAL PATIENT EDUCATION Description: See Patient Education module for education specifics. Outcome: Progressing   Problem: RH BOWEL ELIMINATION Goal: RH STG MANAGE BOWEL WITH ASSISTANCE Description: STG Manage Bowel with toileting Assistance. Outcome: Progressing   Problem: RH SAFETY Goal: RH STG ADHERE TO SAFETY PRECAUTIONS W/ASSISTANCE/DEVICE Description: STG Adhere to Safety Precautions With Assistance/Device. Outcome: Progressing   Problem: RH PAIN MANAGEMENT Goal: RH STG PAIN MANAGED AT OR BELOW PT'S PAIN GOAL Description: Pain < 4 with prns Outcome: Progressing

## 2023-12-30 NOTE — Progress Notes (Signed)
 Physical Therapy Session Note  Patient Details  Name: Annette Lucas MRN: [id number] Date of Birth: 10/31/1970  Today's Date: 12/30/2023 PT Individual Time: 1045-1200 PT Individual Time Calculation (min): 75 min   Short Term Goals: Week 1:  PT Short Term Goal 1 (Week 1): STG=LTG 2/2 ELOS  Skilled Therapeutic Interventions/Progress Updates: Pt presents sitting in w/c and agreeable to therapy.  Pt wheeled to outdoors w/ independence including on/off elevators.  Pt removed and attached leg rests independently.  Pt negotiated over uneven terrain outside including up and down ramped surfaces.  Pt performed SPT w/c <> bench w/ mod I.  Pt reviewed HEP for continued strengthening at home prior to beginning of outpt PT.  Pt returned to indoors and to 4th floor.  Pt amb w/ RW and mod I up to 50' although requires standing rest break.  Encouraged pt to monitor fatigue to allow for return to seated surface.  Pt amb from hallway into room w/ RW to recliner.  Seat alarm in place and all needs in reach, LES elevated.  HEP printout to be given today.     Therapy Documentation Precautions:  Precautions Precautions: Fall Recall of Precautions/Restrictions: Intact Required Braces or Orthoses: Splint/Cast Splint/Cast: RLE Restrictions Weight Bearing Restrictions Per Provider Order: Yes RLE Weight Bearing Per Provider Order: Non weight bearing General:   Vital Signs:   Pain:0/10     Therapy/Group: Individual Therapy  Lucio Edward 12/30/2023, 12:06 PM

## 2023-12-31 DIAGNOSIS — K5901 Slow transit constipation: Secondary | ICD-10-CM | POA: Diagnosis not present

## 2023-12-31 DIAGNOSIS — S82201S Unspecified fracture of shaft of right tibia, sequela: Secondary | ICD-10-CM | POA: Diagnosis not present

## 2023-12-31 DIAGNOSIS — S82401S Unspecified fracture of shaft of right fibula, sequela: Secondary | ICD-10-CM | POA: Diagnosis not present

## 2023-12-31 NOTE — Progress Notes (Signed)
 PROGRESS NOTE   Subjective/Complaints:  Pt doing well, ready to go home. Slept well, pain managed, LBM yesterday per pt but not documented, urinating fine, denies any other complaints or concerns. Meds reviewed, d/c paperwork discussed, advised she needs TOC meds before she leaves.   ROS: Patient denies fever, dizziness, nausea, vomiting, diarrhea,  shortness of breath or chest pain, headache, or mood change.  No new motor or sensory changes. Left ankle pain-improved today  Objective:   No results found.  Recent Labs    12/29/23 0607  WBC 9.5  HGB 11.6*  HCT 33.1*  PLT 340   Recent Labs    12/29/23 0607  NA 138  K 3.7  CL 98  CO2 29  GLUCOSE 108*  BUN 20  CREATININE 0.76  CALCIUM 9.2         Intake/Output Summary (Last 24 hours) at 12/31/2023 1059 Last data filed at 12/31/2023 0847 Gross per 24 hour  Intake 708 ml  Output --  Net 708 ml         Physical Exam: Vital Signs Blood pressure 93/63, pulse 73, temperature 97.9 F (36.6 C), temperature source Oral, resp. rate 16, height 5\' 4"  (1.626 m), weight 68.1 kg, last menstrual period 06/14/2011, SpO2 95%.  Constitution: Appropriate appearance for age. No apparent distress. Laying in bed, appears comfortable Resp: No respiratory distress. No accessory muscle usage. on RA and CTAB Cardio: Well perfused appearance. No peripheral edema in LLE, unable to assess in RLE due to splint.  Brisk capillary refill bilaterally. RRR, no m/r/g appreciated Abdomen: Soft, nontender, nondistended, positive bowel sounds Psych: Appropriate mood and affect. MSK: Right lower extremity cast.  Full active range of motion all other extremities  Neuro: AAOx4. No apparent cognitive deficits.  PRIOR EXAMS: Bilateral upper lower extremity stocking glove pattern peripheral neuropathy. 5 out of 5 strength in bilateral upper extremities and left lower extremity; right lower extremity  4 out of 5 hip flexion, can wiggle toes antigravity and against resistance Exam stable 4/3     Assessment/Plan: 1. Functional deficits which require 3+ hours per day of interdisciplinary therapy in a comprehensive inpatient rehab setting. Physiatrist is providing close team supervision and 24 hour management of active medical problems listed below. Physiatrist and rehab team continue to assess barriers to discharge/monitor patient progress toward functional and medical goals  Care Tool:  Bathing    Body parts bathed by patient: Right arm, Left arm, Chest, Abdomen, Front perineal area, Buttocks, Right upper leg, Left upper leg, Left lower leg, Face     Body parts n/a: Right lower leg (cast)   Bathing assist Assist Level: Independent with assistive device     Upper Body Dressing/Undressing Upper body dressing   What is the patient wearing?: Pull over shirt    Upper body assist Assist Level: Independent with assistive device    Lower Body Dressing/Undressing Lower body dressing      What is the patient wearing?: Underwear/pull up, Pants     Lower body assist Assist for lower body dressing: Independent with assitive device     Toileting Toileting    Toileting assist Assist for toileting: Independent with assistive device  Transfers Chair/bed transfer  Transfers assist     Chair/bed transfer assist level: Independent with assistive device Chair/bed transfer assistive device: Geologist, engineering   Ambulation assist      Assist level: Independent with assistive device Assistive device: Friebel-rolling Max distance: 50   Walk 10 feet activity   Assist     Assist level: Independent with assistive device Assistive device: [place]-rolling   Walk 50 feet activity   Assist Walk 50 feet with 2 turns activity did not occur: Safety/medical concerns         Walk 150 feet activity   Assist Walk 150 feet activity did not occur:  Safety/medical concerns         Walk 10 feet on uneven surface  activity   Assist Walk 10 feet on uneven surfaces activity did not occur: Safety/medical concerns   Assist level: Supervision/Verbal cueing Assistive device: Hagedorn-rolling   Wheelchair     Assist Is the patient using a wheelchair?: Yes Type of Wheelchair: Manual    Wheelchair assist level: Independent Max wheelchair distance: 150+    Wheelchair 50 feet with 2 turns activity    Assist        Assist Level: Independent   Wheelchair 150 feet activity     Assist      Assist Level: Independent   Blood pressure 93/63, pulse 73, temperature 97.9 F (36.6 C), temperature source Oral, resp. rate 16, height 5\' 4"  (1.626 m), weight 68.1 kg, last menstrual period 06/14/2011, SpO2 95%.  Medical Problem List and Plan: 1. Functional deficits secondary to right tibia fracture status post IM nail             -patient may not shower unless cast is totally covered             -ELOS/Goals: 7 to 10 days, modified independent PT/OT -Continue CIR -Expected discharge 4/5 -Will consider asking orthopedics to rewrap cast -Will ask ortho to check on splint - adjust heal area -12/31/23 d/c today, reviewed paperwork, advised she needs to get TOC meds before she leaves 2.  Antithrombotics: -DVT/anticoagulation:  Pharmaceutical: Lovenox 40mg  daily             -antiplatelet therapy: N/a 3. Pain Management:  Oxycodone prn. Continue robaxin 500 mg QID and tylenol 650 mg qid prn.             --ice/heat prn -12/25/23 pain controlled but suggested she monitor the timing of doses particularly at night so she can try to improve her sleep a bit/have better pain control overnight; having spasms she says, may need to consider switching to other muscle relaxant but for now leave robaxin. Kpad ordered. -3/31 and reports pain overall controlled and improving, continues to intermittently use oxycodone 4/1 schedule oxycodone 5 mg at  7 AM before therapy  4/2 continue current pain management for now and monitor 4/3 continue current medications, check tib-fib x-ray-stable 4/4 Gabapentin increased to QID for neuropathic pain 4. Mood/Behavior/Sleep: LCSW to follow for evaluation and support.              -antipsychotic agents: N/A  -Melatonin PRN  -Buspar 5mg  BID 5. Neuropsych/cognition: This patient is capable of making decisions on her own behalf. 6. Skin/Wound Care: Routine pressure relief measures.  7. Fluids/Electrolytes/Nutrition: Monitor I/O. Continue vitamins. Routine labs  -12/25/23 CMP yesterday basically unremarkable, Mg today WNL.  8. 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 B12 level WNL 432 on  3/30   9. Peripheral edema/HTN: Monitor BP TID. Continue Demadex 20mg  bid.   -4/1 BP stable continue to monitor  -4/4 BP controlled continue to monitor  -12/31/23 BP relatively stable, monitor outpatient Vitals:   12/26/23 1937 12/27/23 0458 12/27/23 1418 12/27/23 1826  BP: (!) 132/59 100/62 128/74 122/75   12/28/23 1849 12/29/23 0518 12/29/23 1243 12/29/23 1908  BP: 111/69 118/75 114/72 123/74   12/30/23 0354 12/30/23 1349 12/30/23 2036 12/31/23 0528  BP: 119/65 (!) 142/75 115/73 93/63    10. Anxiety d/o?: On buspar 5mg  BID 11. Hypokalemia: Will check daily and supplement aggressively (currently BID)  -12/25/23 K 4.0 yesterday, monitor  -3/31 potassium stable at 3.9, continue current regimen  -4/3 potassium 3.7 today, continue supplementation 12. Hypomagnesia: Recheck in am and supplement which will help K+ also  -12/25/23 Mg 1.7 today, monitor 13. Peripheral neuropathy: Continue gabapentin 300mg  TID. --Was on high dose B12 PTA-->-check level in am> WNL 432 on 12/25/23 14. GERD: stable on Protonix 40mg  daily 15. Low normal Vitamin D level 38.13 on 3/27: Add ergocalciferol 50,000U weekly for supplement.  16.  Constipation.  Last bowel movement prior to admission.   Daily MiraLAX, Senokot-S 2 tabs twice daily, and as needed sorbitol started on admission. -12/25/23 still no BM, added colace 100mg  daily, increased miralax to BID, Mg Citrate PRN if no BM later, monitor   3/31 improved after BM today and yesterday 4/4 LBM yesterday, continue to monitor  -12/31/23 LBM yesterday per pt, advised to monitor once home.     LOS: 7 days A FACE TO FACE EVALUATION WAS PERFORMED  [address] 12/31/2023, 10:59 AM

## 2023-12-31 NOTE — Plan of Care (Signed)

## 2023-12-31 NOTE — Progress Notes (Signed)
 Inpatient Rehabilitation Discharge Medication Review by a Pharmacist  A complete drug regimen review was completed for this patient to identify any potential clinically significant medication issues.  High Risk Drug Classes Is patient taking? Indication by Medication  Antipsychotic No   Anticoagulant No   Antibiotic No   Opioid Yes Oxycodone - pain  Antiplatelet Yes ASA - VTE ppx x 30 days  Hypoglycemics/insulin No   Vasoactive Medication No   Chemotherapy No   Other Yes APAP - pain Torsemide - diuretic Buspar - anxiety Gabapentin - peripheral neuropathy Methocarbamol - muscle spasm Colace, Senna - bowel regimen Pantoprazole - GERD Folic Acid, B12, MgOx, KCl, Vit D - supplements Melatonin - insomnia     Type of Medication Issue Identified Description of Issue Recommendation(s)  Drug Interaction(s) (clinically significant)     Duplicate Therapy     Allergy     No Medication Administration End Date     Incorrect Dose     Additional Drug Therapy Needed     Significant med changes from prior encounter (inform family/care partners about these prior to discharge).    Other       Clinically significant medication issues were identified that warrant physician communication and completion of prescribed/recommended actions by midnight of the next day:  No  Name of provider notified for urgent issues identified:   Provider Method of Notification:     Pharmacist comments:   Time spent performing this drug regimen review (minutes):  15   Aerabella Galasso, Judie Bonus 12/31/2023 9:16 AM

## 2023-12-31 NOTE — Plan of Care (Signed)
  Problem: Pain Managment: Goal: General experience of comfort will improve and/or be controlled Outcome: Progressing   Problem: Safety: Goal: Ability to remain free from injury will improve Outcome: Progressing   Problem: Skin Integrity: Goal: Risk for impaired skin integrity will decrease Outcome: Progressing

## 2024-04-04 ENCOUNTER — Other Ambulatory Visit: Payer: Self-pay | Admitting: Student

## 2024-04-04 DIAGNOSIS — S82202A Unspecified fracture of shaft of left tibia, initial encounter for closed fracture: Secondary | ICD-10-CM

## 2024-04-11 ENCOUNTER — Encounter: Payer: Self-pay | Admitting: Student

## 2024-04-13 ENCOUNTER — Ambulatory Visit
Admission: RE | Admit: 2024-04-13 | Discharge: 2024-04-13 | Disposition: A | Discharge status: Home / Self Care | Source: Ambulatory Visit | Attending: Student | Admitting: Student

## 2024-04-13 ENCOUNTER — Other Ambulatory Visit: Payer: Self-pay | Admitting: Student

## 2024-04-13 DIAGNOSIS — S82402A Unspecified fracture of shaft of left fibula, initial encounter for closed fracture: Secondary | ICD-10-CM
